# Patient Record
Sex: Female | Born: 1980 | Race: White | Hispanic: No | Marital: Married | Smoking: Never smoker
Health system: Southern US, Community
[De-identification: ages and names within clinical notes are randomized; demographics above are authoritative.]

## PROBLEM LIST (undated history)

## (undated) DIAGNOSIS — N814 Uterovaginal prolapse, unspecified: Secondary | ICD-10-CM

## (undated) DIAGNOSIS — R112 Nausea with vomiting, unspecified: Secondary | ICD-10-CM

## (undated) DIAGNOSIS — Z9889 Other specified postprocedural states: Secondary | ICD-10-CM

## (undated) DIAGNOSIS — T7840XA Allergy, unspecified, initial encounter: Secondary | ICD-10-CM

## (undated) DIAGNOSIS — E785 Hyperlipidemia, unspecified: Secondary | ICD-10-CM

## (undated) DIAGNOSIS — K900 Celiac disease: Secondary | ICD-10-CM

## (undated) DIAGNOSIS — C44311 Basal cell carcinoma of skin of nose: Secondary | ICD-10-CM

## (undated) HISTORY — DX: Basal cell carcinoma of skin of nose: C44.311

## (undated) HISTORY — DX: Celiac disease: K90.0

## (undated) HISTORY — DX: Allergy, unspecified, initial encounter: T78.40XA

## (undated) HISTORY — DX: Uterovaginal prolapse, unspecified: N81.4

## (undated) HISTORY — DX: Hyperlipidemia, unspecified: E78.5

## (undated) HISTORY — PX: WISDOM TOOTH EXTRACTION: SHX21

## (undated) HISTORY — PX: SKIN CANCER EXCISION: SHX779

## (undated) HISTORY — PX: TUBAL LIGATION: SHX77

---
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
## 2009-08-23 DIAGNOSIS — N814 Uterovaginal prolapse, unspecified: Secondary | ICD-10-CM

## 2009-08-23 HISTORY — DX: Uterovaginal prolapse, unspecified: N81.4

## 2011-08-06 ENCOUNTER — Ambulatory Visit: Payer: Self-pay | Admitting: Family Medicine

## 2011-11-19 ENCOUNTER — Ambulatory Visit (INDEPENDENT_AMBULATORY_CARE_PROVIDER_SITE_OTHER): Payer: 59 | Admitting: Family Medicine

## 2011-11-19 ENCOUNTER — Encounter: Payer: Self-pay | Admitting: Family Medicine

## 2011-11-19 VITALS — BP 112/75 | HR 83 | Temp 98.2°F | Ht 62.0 in | Wt 126.0 lb

## 2011-11-19 DIAGNOSIS — R5381 Other malaise: Secondary | ICD-10-CM

## 2011-11-19 DIAGNOSIS — R5383 Other fatigue: Secondary | ICD-10-CM

## 2011-11-19 DIAGNOSIS — R221 Localized swelling, mass and lump, neck: Secondary | ICD-10-CM

## 2011-11-19 DIAGNOSIS — E785 Hyperlipidemia, unspecified: Secondary | ICD-10-CM

## 2011-11-19 DIAGNOSIS — K900 Celiac disease: Secondary | ICD-10-CM | POA: Insufficient documentation

## 2011-11-19 DIAGNOSIS — R220 Localized swelling, mass and lump, head: Secondary | ICD-10-CM

## 2011-11-19 DIAGNOSIS — E049 Nontoxic goiter, unspecified: Secondary | ICD-10-CM

## 2011-11-19 LAB — CBC WITH DIFFERENTIAL/PLATELET
Basophils Relative: 0.4 % (ref 0.0–3.0)
Eosinophils Relative: 0.9 % (ref 0.0–5.0)
HCT: 40 % (ref 36.0–46.0)
Lymphs Abs: 2 10*3/uL (ref 0.7–4.0)
MCV: 95 fl (ref 78.0–100.0)
Monocytes Absolute: 0.4 10*3/uL (ref 0.1–1.0)
Monocytes Relative: 5.9 % (ref 3.0–12.0)
Neutrophils Relative %: 60.9 % (ref 43.0–77.0)
RBC: 4.21 Mil/uL (ref 3.87–5.11)
WBC: 6.3 10*3/uL (ref 4.5–10.5)

## 2011-11-19 LAB — COMPREHENSIVE METABOLIC PANEL
Albumin: 4.1 g/dL (ref 3.5–5.2)
Alkaline Phosphatase: 45 U/L (ref 39–117)
BUN: 14 mg/dL (ref 6–23)
CO2: 27 mEq/L (ref 19–32)
Calcium: 9.1 mg/dL (ref 8.4–10.5)
GFR: 86.02 mL/min (ref >60.00)
Glucose, Bld: 133 mg/dL — ABNORMAL HIGH (ref 70–99)
Potassium: 3.4 mEq/L — ABNORMAL LOW (ref 3.5–5.1)
Total Protein: 7.1 g/dL (ref 6.0–8.3)

## 2011-11-19 LAB — LIPID PANEL
Cholesterol: 155 mg/dL (ref 0–200)
LDL Cholesterol: 99 mg/dL (ref 0–99)
Triglycerides: 48 mg/dL (ref 0.0–149.0)

## 2011-11-19 LAB — T4, FREE: Free T4: 0.78 ng/dL (ref 0.60–1.60)

## 2011-11-19 NOTE — Progress Notes (Signed)
Subjective:    Patient ID: [name], female    DOB: 17-Nov-1980, 31 y.o.   MRN: [id number]  HPI Very pleasant 31 you G1P1 here to establish care.  1.  Right neck mass- noticed it at least a year ago but has been growing in size over past several months.  Non tender to palpation.  Has had some difficulty intermittently swallowing pills otherwise no dysphagia.  2.  Family h/o thyroid disease- mom has hypothyroidism, sister has hashimotos. She has been more fatigued and cold lately. Difficult to tell if she is constipated since she has Celiac disease. No other noticeable symptoms of hypo or hyperthyroidism.  3.  Celiac disease- asymptomatic when she does follow gluten free diet. Has a nonspecific, raised, scaly rash.  Non itchy- ? Related to her celiac.  She has an appt with derm for this.  Patient Active Problem List  Diagnosis  . Enlarged thyroid  . Mass of right side of neck  . Hyperlipidemia  . Celiac disease   Past Medical History  Diagnosis Date  . Hyperlipidemia   . Celiac disease   . Uterine prolapse    Past Surgical History  Procedure Date  . Appendectomy 1995  . Tonsillectomy and adenoidectomy 1995   History  Substance Use Topics  . Smoking status: Never Smoker   . Smokeless tobacco: Not on file  . Alcohol Use: Yes     occassionally   Family History  Problem Relation Age of Onset  . Hypothyroidism Mother   . Hyperlipidemia Father   . Celiac disease Sister   . Hashimoto's thyroiditis Sister   . Heart disease Paternal Grandfather 30    multiple CABG  . Hyperlipidemia Paternal Grandfather    No Known Allergies No current outpatient prescriptions on file prior to visit.   The PMH, PSH, Social History, Family History, Medications, and allergies have been reviewed in [facility], and have been updated if relevant.    Review of Systems See HPI No fevers No changes in weight No night sweats    Objective:   Physical Exam BP 112/75  Pulse 83  Temp 98.2 F  (36.8 C) (Oral)  Ht 5\' 2"  (1.575 m)  Wt 126 lb (57.153 kg)  BMI 23.05 kg/m2  LMP 10/21/2011  General:  Well-developed,well-nourished,in no acute distress; alert,appropriate and cooperative throughout examination Head:  normocephalic and atraumatic.   Eyes:  vision grossly intact, pupils equal, pupils round, and pupils reactive to light.   Ears:  R ear normal and L ear normal.   Nose:  no external deformity.   Mouth:  good dentition.   Neck:   ~1.5  cm freely movable non tender mass on right neck, enlarged thyroid gland, left > right, no nodules, NTTP No other cervical lymphadenopathy noted. Lungs:  Normal respiratory effort, chest expands symmetrically. Lungs are clear to auscultation, no crackles or wheezes. Heart:  Normal rate and regular rhythm. S1 and S2 normal without gallop, murmur, click, rub or other extra sounds. Extremities:  No clubbing, cyanosis, edema, or deformity noted with normal full range of motion of all joints.   Neurologic:  alert & oriented X3 and gait normal.   Psych:  Cognition and judgment appear intact. Alert and cooperative with normal attention span and concentration. No apparent delusions, illusions, hallucinations     Assessment & Plan:   1. Mass of right side of neck  Enlarging- ? Lymph node vs cyst. Will order ultrasound of neck for further evaluation. US Soft Tissue Head/Neck  2. Enlarged thyroid  Check TSH, FT4- ?hypothyroidism. Will evaluate thyroid with ultrasound- see above. US Soft Tissue Head/Neck, TSH, T4, Free  3. Fatigue  Likely related to #2 but will check other labs to rule out other possible contributing factors. The patient indicates understanding of these issues and agrees with the plan.  CBC with Differential, Comprehensive metabolic panel  4. HLD (hyperlipidemia)  Lipid Panel

## 2011-11-19 NOTE — Patient Instructions (Addendum)
It was great to meet you. Please stop by to see [name] after you to the lab- she will set up your ultrasound at [facility].  We will call you with your lab results.

## 2011-11-23 ENCOUNTER — Inpatient Hospital Stay (HOSPITAL_COMMUNITY): Admission: RE | Admit: 2011-11-23 | Payer: 59 | Source: Ambulatory Visit

## 2011-11-23 ENCOUNTER — Ambulatory Visit (HOSPITAL_COMMUNITY)
Admission: RE | Admit: 2011-11-23 | Discharge: 2011-11-23 | Disposition: A | Discharge status: Home / Self Care | Payer: 59 | Source: Ambulatory Visit | Attending: Family Medicine | Admitting: Family Medicine

## 2011-11-23 ENCOUNTER — Other Ambulatory Visit (HOSPITAL_COMMUNITY): Payer: 59

## 2011-11-23 DIAGNOSIS — R220 Localized swelling, mass and lump, head: Secondary | ICD-10-CM | POA: Insufficient documentation

## 2011-11-23 DIAGNOSIS — E049 Nontoxic goiter, unspecified: Secondary | ICD-10-CM

## 2011-11-23 DIAGNOSIS — R221 Localized swelling, mass and lump, neck: Secondary | ICD-10-CM

## 2011-11-24 ENCOUNTER — Other Ambulatory Visit: Payer: Self-pay | Admitting: Family Medicine

## 2011-11-24 DIAGNOSIS — R221 Localized swelling, mass and lump, neck: Secondary | ICD-10-CM

## 2011-12-07 ENCOUNTER — Encounter (INDEPENDENT_AMBULATORY_CARE_PROVIDER_SITE_OTHER): Payer: Self-pay | Admitting: Surgery

## 2011-12-07 ENCOUNTER — Ambulatory Visit (INDEPENDENT_AMBULATORY_CARE_PROVIDER_SITE_OTHER): Payer: Commercial Managed Care - PPO | Admitting: Surgery

## 2011-12-07 VITALS — BP 112/60 | HR 56 | Temp 99.6°F | Resp 18 | Ht 62.0 in | Wt 124.0 lb

## 2011-12-07 DIAGNOSIS — R221 Localized swelling, mass and lump, neck: Secondary | ICD-10-CM

## 2011-12-07 DIAGNOSIS — R220 Localized swelling, mass and lump, head: Secondary | ICD-10-CM

## 2011-12-07 NOTE — Patient Instructions (Signed)
Will repeat ultrasound of right anterior chain lymph nodes in 6 months.  Velora Heckler, MD, [facility] Office: [phone]

## 2011-12-07 NOTE — Progress Notes (Signed)
General Surgery [facility]  Chief Complaint  Patient presents with  . New Evaluation    lymph node enlargement - referral from Dr. 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 This has been present for many years. Over the past year the patient has noted mild enlargement. She denies any history of infection. She has not been on any antibiotics. Patient underwent a thyroid ultrasound in September 2013 which showed a normal thyroid gland. There was a 7 mm nodule at the site of the palpable abnormality. This was felt to represent a small lymph node and was felt to be normal. No other significant abnormality was identified.  Patient has had no prior head or neck surgery. She has never been on thyroid medication. She did undergo tonsillectomy at age 89.  There is a significant family history of endocrine disease with the mother being hypothyroid, a sister having Hashimoto's thyroiditis, and a maternal uncle with a pituitary tumor who underwent adrenalectomy for Cushing's syndrome.  Past Medical History  Diagnosis Date  . Hyperlipidemia   . Celiac disease   . Uterine prolapse      No current outpatient prescriptions on file.     No Known Allergies   Family History  Problem Relation Age of Onset  . Hypothyroidism Mother   . Hyperlipidemia Father   . Celiac disease Sister   . Hashimoto's thyroiditis Sister   . Heart disease Paternal Grandfather 30    multiple CABG  . Hyperlipidemia Paternal Grandfather      History   Social History  . Marital Status: Married    Spouse Name: N/A    Number of Children: N/A  . Years of Education: N/A   Social History Main Topics  . Smoking status: Never Smoker   . Smokeless tobacco: None  . Alcohol Use: Yes     occassionally  . Drug Use: None  . Sexually Active: None   Other Topics Concern  . 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     REVIEW OF SYSTEMS - PERTINENT POSITIVES ONLY: Denies tremor. Denies palpitations. Denies other lymphadenopathy. Denies recent infections. Denies significant dysphagia.  EXAM: Filed Vitals:   12/07/11 1026  BP: 112/60  Pulse: 56  Temp: 99.6 F (37.6 C)  Resp: 18    HEENT: normocephalic; pupils equal and reactive; sclerae clear; dentition good; mucous membranes moist NECK:  symmetric on extension; no palpable anterior or posterior cervical lymphadenopathy; no supraclavicular masses; no tenderness; palpable small nodular density superior right anterior cervical chain measuring less than 1 cm in diameter, nontender, mobile CHEST: clear to auscultation bilaterally without rales, rhonchi, or wheezes CARDIAC: regular rate and rhythm without significant murmur; peripheral pulses are full EXT:  non-tender without edema; no deformity NEURO: no gross focal deficits; no sign of tremor   LABORATORY RESULTS: See [facility] ([facility]Epic) for most recent results   RADIOLOGY RESULTS: See [facility] ([facility]Epic) for most recent results   IMPRESSION: #1 normal thyroid examination #2 palpable nodule high right anterior cervical chain, likely benign lymph node  PLAN: The patient is seen with her mother in attendance. I have reassured them both at this likely represents a small benign lymph node and there is no evidence of pathology. To be safe we will repeat a ultrasound in  6 months. Patient will return for physical examination and review of the ultrasound results.  Velora Heckler, MD, FACS General & Endocrine Surgery [facility]   Visit Diagnoses: 1. Mass of right side of neck     Primary Care Physician: Ruthe Mannan, MD

## 2011-12-09 ENCOUNTER — Ambulatory Visit (INDEPENDENT_AMBULATORY_CARE_PROVIDER_SITE_OTHER): Payer: Commercial Managed Care - PPO | Admitting: Surgery

## 2012-01-28 ENCOUNTER — Ambulatory Visit (INDEPENDENT_AMBULATORY_CARE_PROVIDER_SITE_OTHER): Payer: 59 | Admitting: Obstetrics & Gynecology

## 2012-01-28 ENCOUNTER — Encounter: Payer: Self-pay | Admitting: Obstetrics & Gynecology

## 2012-01-28 VITALS — BP 107/63 | HR 63 | Ht 62.0 in | Wt 124.4 lb

## 2012-01-28 DIAGNOSIS — IMO0001 Reserved for inherently not codable concepts without codable children: Secondary | ICD-10-CM

## 2012-01-28 DIAGNOSIS — N814 Uterovaginal prolapse, unspecified: Secondary | ICD-10-CM

## 2012-01-28 DIAGNOSIS — Z1151 Encounter for screening for human papillomavirus (HPV): Secondary | ICD-10-CM

## 2012-01-28 DIAGNOSIS — Z124 Encounter for screening for malignant neoplasm of cervix: Secondary | ICD-10-CM

## 2012-01-28 DIAGNOSIS — N812 Incomplete uterovaginal prolapse: Secondary | ICD-10-CM

## 2012-01-28 DIAGNOSIS — Z309 Encounter for contraceptive management, unspecified: Secondary | ICD-10-CM

## 2012-01-28 DIAGNOSIS — Z01419 Encounter for gynecological examination (general) (routine) without abnormal findings: Secondary | ICD-10-CM

## 2012-01-28 MED ORDER — NORETHIN ACE-ETH ESTRAD-FE 1-20 MG-MCG(24) PO TABS: 1.0000 | ORAL_TABLET | Freq: Every day | ORAL | Status: DC

## 2012-01-28 NOTE — Progress Notes (Signed)
  Subjective:     [name] is a 31 y.o. G1P1 female and is here for a comprehensive gynecologic physical exam and to establish gynecologic care. She was referred by Dr. Ruthe Mannan. The patient reports  having significant uterine prolapse after the birth of her only child. Her former gynecologist apparently told her she will need a hysterectomy. She reports feeling pelvic pelvic pressure and lower pelvic discomfort frequently. She is unsure if she is done with childbearing. No issues during intercourse, she uses condoms for contraception, but wants to be on OCPs.  History   Social History  . Marital Status: Married    Spouse Name: N/A    Number of Children: N/A  . Years of Education: N/A   Occupational History  . Not on file.   Social History Main Topics  . Smoking status: Never Smoker   . Smokeless tobacco: Never Used  . Alcohol Use: Yes     Comment: occassionally  . Drug Use: No  . Sexually Active: Yes -- Female partner(s)    Birth Control/ Protection: Condom   Other Topics Concern  . 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   Health Maintenance  Topic Date Due  . Pap Smear  02/28/1998  . Tetanus/tdap  03/01/1999  . Influenza Vaccine  10/25/2011    The following portions of the patient's history were reviewed and updated as appropriate: allergies, current medications, past family history, past medical history, past social history, past surgical history and problem list.  Review of Systems Pertinent items are noted in HPI.   Objective:   Blood pressure 107/63, pulse 63, height 5\' 2"  (1.575 m), weight 124 lb 6.4 oz (56.427 kg), last menstrual period 01/16/2012. GENERAL: Well-developed, well-nourished female in no acute distress.  HEENT: Normocephalic, atraumatic. Sclerae anicteric.  NECK: Supple. Normal thyroid.  LUNGS: Clear to auscultation bilaterally.   HEART: Regular rate and rhythm. BREASTS: Symmetric in size. No masses, skin changes, nipple drainage, or lymphadenopathy. ABDOMEN: Soft, nontender, nondistended. No organomegaly. PELVIC: Normal external female genitalia. Vagina is pink and rugated.  Normal discharge. Normal cervix contour. Pap smear obtained. Uterus is normal in size, noted to prolapse halfway down vagina with Valsalva (Grade 1 prolapse).  No vaginal prolapse, no rectocele or cystocele noted. No adnexal mass or tenderness.  EXTREMITIES: No cyanosis, clubbing, or edema, 2+ distal pulses.   Assessment:    Healthy female exam.  Grade 1 uterine prolapse Desires OCPs for contraception     Plan:  Pap done, will follow up results and manage accordingly. 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  The nonsurgical options involve pessary placement or other conservative management, patient is not interested in these options.  She understands and will consider her options for a while longer.  She was told to call/come back once she reaches a decision. Routine preventative health maintenance measures emphasized

## 2012-01-28 NOTE — Patient Instructions (Signed)
Preventive Care for Adults, Female A healthy lifestyle and preventive care can promote health and wellness. Preventive health guidelines for women include the following key practices.  A routine yearly physical is a good way to check with your caregiver about your health and preventive screening. It is a chance to share any concerns and updates on your health, and to receive a thorough exam.  Visit your dentist for a routine exam and preventive care every 6 months. Brush your teeth twice a day and floss once a day. Good oral hygiene prevents tooth decay and gum disease.  The frequency of eye exams is based on your age, health, family medical history, use of contact lenses, and other factors. Follow your caregiver's recommendations for frequency of eye exams.  Eat a healthy diet. Foods like vegetables, fruits, whole grains, low-fat dairy products, and lean protein foods contain the nutrients you need without too many calories. Decrease your intake of foods high in solid fats, added sugars, and salt. Eat the right amount of calories for you.Get information about a proper diet from your caregiver, if necessary.  Regular physical exercise is one of the most important things you can do for your health. Most adults should get at least 150 minutes of moderate-intensity exercise (any activity that increases your heart rate and causes you to sweat) each week. In addition, most adults need muscle-strengthening exercises on 2 or more days a week.  Maintain a healthy weight. The body mass index (BMI) is a screening tool to identify possible weight problems. It provides an estimate of body fat based on height and weight. Your caregiver can help determine your BMI, and can help you achieve or maintain a healthy weight.For adults 20 years and older:  A BMI below 18.5 is considered underweight.  A BMI of 18.5 to 24.9 is normal.  A BMI of 25 to 29.9 is considered overweight.  A BMI of 30 and above is  considered obese.  Maintain normal blood lipids and cholesterol levels by exercising and minimizing your intake of saturated fat. Eat a balanced diet with plenty of fruit and vegetables. Blood tests for lipids and cholesterol should begin at age 41 and be repeated every 5 years. If your lipid or cholesterol levels are high, you are over 50, or you are at high risk for heart disease, you may need your cholesterol levels checked more frequently.Ongoing high lipid and cholesterol levels should be treated with medicines if diet and exercise are not effective.  If you smoke, find out from your caregiver how to quit. If you do not use tobacco, do not start.  If you are pregnant, do not drink alcohol. If you are breastfeeding, be very cautious about drinking alcohol. If you are not pregnant and choose to drink alcohol, do not exceed 1 drink per day. One drink is considered to be 12 ounces (355 mL) of beer, 5 ounces (148 mL) of wine, or 1.5 ounces (44 mL) of liquor.  Avoid use of street drugs. Do not share needles with anyone. Ask for help if you need support or instructions about stopping the use of drugs.  High blood pressure causes heart disease and increases the risk of stroke. Your blood pressure should be checked at least every 1 to 2 years. Ongoing high blood pressure should be treated with medicines if weight loss and exercise are not effective.  If you are 65 to 31 years old, ask your caregiver if you should take aspirin to prevent strokes.  Diabetes  screening involves taking a blood sample to check your fasting blood sugar level. This should be done once every 3 years, after age 45, if you are within normal weight and without risk factors for diabetes. Testing should be considered at a younger age or be carried out more frequently if you are overweight and have at least 1 risk factor for diabetes.  Breast cancer screening is essential preventive care for women. You should practice "breast  self-awareness." This means understanding the normal appearance and feel of your breasts and may include breast self-examination. Any changes detected, no matter how small, should be reported to a caregiver. Women in their 20s and 30s should have a clinical breast exam (CBE) by a caregiver as part of a regular health exam every 1 to 3 years. After age 40, women should have a CBE every year. Starting at age 40, women should consider having a mammography (breast X-ray test) every year. Women who have a family history of breast cancer should talk to their caregiver about genetic screening. Women at a high risk of breast cancer should talk to their caregivers about having magnetic resonance imaging (MRI) and a mammography every year.  The Pap test is a screening test for cervical cancer. A Pap test can show cell changes on the cervix that might become cervical cancer if left untreated. A Pap test is a procedure in which cells are obtained and examined from the lower end of the uterus (cervix).  Women should have a Pap test starting at age 21.  Between ages 21 and 29, Pap tests should be repeated every 2 years.  Beginning at age 30, you should have a Pap test every 3 years as long as the past 3 Pap tests have been normal.  Some women have medical problems that increase the chance of getting cervical cancer. Talk to your caregiver about these problems. It is especially important to talk to your caregiver if a new problem develops soon after your last Pap test. In these cases, your caregiver may recommend more frequent screening and Pap tests.  The above recommendations are the same for women who have or have not gotten the vaccine for human papillomavirus (HPV).  If you had a hysterectomy for a problem that was not cancer or a condition that could lead to cancer, then you no longer need Pap tests. Even if you no longer need a Pap test, a regular exam is a good idea to make sure no other problems are  starting.  If you are between ages 65 and 70, and you have had normal Pap tests going back 10 years, you no longer need Pap tests. Even if you no longer need a Pap test, a regular exam is a good idea to make sure no other problems are starting.  If you have had past treatment for cervical cancer or a condition that could lead to cancer, you need Pap tests and screening for cancer for at least 20 years after your treatment.  If Pap tests have been discontinued, risk factors (such as a new sexual partner) need to be reassessed to determine if screening should be resumed.  The HPV test is an additional test that may be used for cervical cancer screening. The HPV test looks for the virus that can cause the cell changes on the cervix. The cells collected during the Pap test can be tested for HPV. The HPV test could be used to screen women aged 30 years and older, and should   be used in women of any age who have unclear Pap test results. After the age of 30, women should have HPV testing at the same frequency as a Pap test.  Colorectal cancer can be detected and often prevented. Most routine colorectal cancer screening begins at the age of 50 and continues through age 75. However, your caregiver may recommend screening at an earlier age if you have risk factors for colon cancer. On a yearly basis, your caregiver may provide home test kits to check for hidden blood in the stool. Use of a small camera at the end of a tube, to directly examine the colon (sigmoidoscopy or colonoscopy), can detect the earliest forms of colorectal cancer. Talk to your caregiver about this at age 50, when routine screening begins. Direct examination of the colon should be repeated every 5 to 10 years through age 75, unless early forms of pre-cancerous polyps or small growths are found.  Hepatitis C blood testing is recommended for all people born from 1945 through 1965 and any individual with known risks for hepatitis C.  Practice  safe sex. Use condoms and avoid high-risk sexual practices to reduce the spread of sexually transmitted infections (STIs). STIs include gonorrhea, chlamydia, syphilis, trichomonas, herpes, HPV, and human immunodeficiency virus (HIV). Herpes, HIV, and HPV are viral illnesses that have no cure. They can result in disability, cancer, and death. Sexually active women aged 25 and younger should be checked for chlamydia. Older women with new or multiple partners should also be tested for chlamydia. Testing for other STIs is recommended if you are sexually active and at increased risk.  Osteoporosis is a disease in which the bones lose minerals and strength with aging. This can result in serious bone fractures. The risk of osteoporosis can be identified using a bone density scan. Women ages 65 and over and women at risk for fractures or osteoporosis should discuss screening with their caregivers. Ask your caregiver whether you should take a calcium supplement or vitamin D to reduce the rate of osteoporosis.  Menopause can be associated with physical symptoms and risks. Hormone replacement therapy is available to decrease symptoms and risks. You should talk to your caregiver about whether hormone replacement therapy is right for you.  Use sunscreen with sun protection factor (SPF) of 30 or more. Apply sunscreen liberally and repeatedly throughout the day. You should seek shade when your shadow is shorter than you. Protect yourself by wearing long sleeves, pants, a wide-brimmed hat, and sunglasses year round, whenever you are outdoors.  Once a month, do a whole body skin exam, using a mirror to look at the skin on your back. Notify your caregiver of new moles, moles that have irregular borders, moles that are larger than a pencil eraser, or moles that have changed in shape or color.  Stay current with required immunizations.  Influenza. You need a dose every fall (or winter). The composition of the flu vaccine  changes each year, so being vaccinated once is not enough.  Pneumococcal polysaccharide. You need 1 to 2 doses if you smoke cigarettes or if you have certain chronic medical conditions. You need 1 dose at age 65 (or older) if you have never been vaccinated.  Tetanus, diphtheria, pertussis (Tdap, Td). Get 1 dose of Tdap vaccine if you are younger than age 65, are over 65 and have contact with an infant, are a healthcare worker, are pregnant, or simply want to be protected from whooping cough. After that, you need a Td   booster dose every 10 years. Consult your caregiver if you have not had at least 3 tetanus and diphtheria-containing shots sometime in your life or have a deep or dirty wound.  HPV. You need this vaccine if you are a woman age 26 or younger. The vaccine is given in 3 doses over 6 months.  Measles, mumps, rubella (MMR). You need at least 1 dose of MMR if you were born in 1957 or later. You may also need a second dose.  Meningococcal. If you are age 19 to 21 and a first-year college student living in a residence hall, or have one of several medical conditions, you need to get vaccinated against meningococcal disease. You may also need additional booster doses.  Zoster (shingles). If you are age 60 or older, you should get this vaccine.  Varicella (chickenpox). If you have never had chickenpox or you were vaccinated but received only 1 dose, talk to your caregiver to find out if you need this vaccine.  Hepatitis A. You need this vaccine if you have a specific risk factor for hepatitis A virus infection or you simply wish to be protected from this disease. The vaccine is usually given as 2 doses, 6 to 18 months apart.  Hepatitis B. You need this vaccine if you have a specific risk factor for hepatitis B virus infection or you simply wish to be protected from this disease. The vaccine is given in 3 doses, usually over 6 months. Preventive Services / Frequency Ages 19 to 39  Blood  pressure check.** / Every 1 to 2 years.  Lipid and cholesterol check.** / Every 5 years beginning at age 20.  Clinical breast exam.** / Every 3 years for women in their 20s and 30s.  Pap test.** / Every 2 years from ages 21 through 29. Every 3 years starting at age 30 through age 65 or 70 with a history of 3 consecutive normal Pap tests.  HPV screening.** / Every 3 years from ages 30 through ages 65 to 70 with a history of 3 consecutive normal Pap tests.  Hepatitis C blood test.** / For any individual with known risks for hepatitis C.  Skin self-exam. / Monthly.  Influenza immunization.** / Every year.  Pneumococcal polysaccharide immunization.** / 1 to 2 doses if you smoke cigarettes or if you have certain chronic medical conditions.  Tetanus, diphtheria, pertussis (Tdap, Td) immunization. / A one-time dose of Tdap vaccine. After that, you need a Td booster dose every 10 years.  HPV immunization. / 3 doses over 6 months, if you are 26 and younger.  Measles, mumps, rubella (MMR) immunization. / You need at least 1 dose of MMR if you were born in 1957 or later. You may also need a second dose.  Meningococcal immunization. / 1 dose if you are age 19 to 21 and a first-year college student living in a residence hall, or have one of several medical conditions, you need to get vaccinated against meningococcal disease. You may also need additional booster doses.  Varicella immunization.** / Consult your caregiver.  Hepatitis A immunization.** / Consult your caregiver. 2 doses, 6 to 18 months apart.  Hepatitis B immunization.** / Consult your caregiver. 3 doses usually over 6 months. Ages 40 to 64  Blood pressure check.** / Every 1 to 2 years.  Lipid and cholesterol check.** / Every 5 years beginning at age 20.  Clinical breast exam.** / Every year after age 40.  Mammogram.** / Every year beginning at age 40   and continuing for as long as you are in good health. Consult with your  caregiver.  Pap test.** / Every 3 years starting at age 30 through age 65 or 70 with a history of 3 consecutive normal Pap tests.  HPV screening.** / Every 3 years from ages 30 through ages 65 to 70 with a history of 3 consecutive normal Pap tests.  Fecal occult blood test (FOBT) of stool. / Every year beginning at age 50 and continuing until age 75. You may not need to do this test if you get a colonoscopy every 10 years.  Flexible sigmoidoscopy or colonoscopy.** / Every 5 years for a flexible sigmoidoscopy or every 10 years for a colonoscopy beginning at age 50 and continuing until age 75.  Hepatitis C blood test.** / For all people born from 1945 through 1965 and any individual with known risks for hepatitis C.  Skin self-exam. / Monthly.  Influenza immunization.** / Every year.  Pneumococcal polysaccharide immunization.** / 1 to 2 doses if you smoke cigarettes or if you have certain chronic medical conditions.  Tetanus, diphtheria, pertussis (Tdap, Td) immunization.** / A one-time dose of Tdap vaccine. After that, you need a Td booster dose every 10 years.  Measles, mumps, rubella (MMR) immunization. / You need at least 1 dose of MMR if you were born in 1957 or later. You may also need a second dose.  Varicella immunization.** / Consult your caregiver.  Meningococcal immunization.** / Consult your caregiver.  Hepatitis A immunization.** / Consult your caregiver. 2 doses, 6 to 18 months apart.  Hepatitis B immunization.** / Consult your caregiver. 3 doses, usually over 6 months. Ages 65 and over  Blood pressure check.** / Every 1 to 2 years.  Lipid and cholesterol check.** / Every 5 years beginning at age 20.  Clinical breast exam.** / Every year after age 40.  Mammogram.** / Every year beginning at age 40 and continuing for as long as you are in good health. Consult with your caregiver.  Pap test.** / Every 3 years starting at age 30 through age 65 or 70 with a 3  consecutive normal Pap tests. Testing can be stopped between 65 and 70 with 3 consecutive normal Pap tests and no abnormal Pap or HPV tests in the past 10 years.  HPV screening.** / Every 3 years from ages 30 through ages 65 or 70 with a history of 3 consecutive normal Pap tests. Testing can be stopped between 65 and 70 with 3 consecutive normal Pap tests and no abnormal Pap or HPV tests in the past 10 years.  Fecal occult blood test (FOBT) of stool. / Every year beginning at age 50 and continuing until age 75. You may not need to do this test if you get a colonoscopy every 10 years.  Flexible sigmoidoscopy or colonoscopy.** / Every 5 years for a flexible sigmoidoscopy or every 10 years for a colonoscopy beginning at age 50 and continuing until age 75.  Hepatitis C blood test.** / For all people born from 1945 through 1965 and any individual with known risks for hepatitis C.  Osteoporosis screening.** / A one-time screening for women ages 65 and over and women at risk for fractures or osteoporosis.  Skin self-exam. / Monthly.  Influenza immunization.** / Every year.  Pneumococcal polysaccharide immunization.** / 1 dose at age 65 (or older) if you have never been vaccinated.  Tetanus, diphtheria, pertussis (Tdap, Td) immunization. / A one-time dose of Tdap vaccine if you are over   65 and have contact with an infant, are a Research scientist (physical sciences), or simply want to be protected from whooping cough. After that, you need a Td booster dose every 10 years.  Varicella immunization.** / Consult your caregiver.  Meningococcal immunization.** / Consult your caregiver.  Hepatitis A immunization.** / Consult your caregiver. 2 doses, 6 to 18 months apart.  Hepatitis B immunization.** / Check with your caregiver. 3 doses, usually over 6 months. ** Family history and personal history of risk and conditions may change your caregiver's recommendations. Document Released: 04/07/2001 Document Revised: 05/04/2011  Document Reviewed: 07/07/2010 [facility] Patient Information 2013 [place], [place].  Thank you for enrolling in MyChart. Please follow the instructions below to securely access your online medical record. MyChart allows you to send messages to your doctor, view your test results, manage appointments, and more.   How Do I Sign Up? 1. In your Internet browser, go to [employer] and enter [email or web address]. 2. Click on the Sign Up Now link in the Sign In box. You will see the New Member Sign Up page. 3. Enter your MyChart Access Code exactly as it appears below. You will not need to use this code after you've completed the sign-up process. If you do not sign up before the expiration date, you must request a new code. MyChart Access Code: [email or web address] Expires: 02/27/2012  8:56 AM  4. Enter your Social Security Number ([email or web address]) and Date of Birth (mm/dd/yyyy) as indicated and click Submit. You will be taken to the next sign-up page. 5. Create a MyChart ID. This will be your MyChart login ID and cannot be changed, so think of one that is secure and easy to remember. 6. Create a MyChart password. You can change your password at any time. 7. Enter your Password Reset Question and Answer. This can be used at a later time if you forget your password.  8. Enter your e-mail address. You will receive e-mail notification when new information is available in MyChart. 9. Click Sign Up. You can now view your medical record.   Additional Information Remember, MyChart is NOT to be used for urgent needs. For medical emergencies, dial 911.

## 2012-02-05 ENCOUNTER — Telehealth: Payer: Self-pay | Admitting: *Deleted

## 2012-02-05 DIAGNOSIS — IMO0001 Reserved for inherently not codable concepts without codable children: Secondary | ICD-10-CM

## 2012-02-05 MED ORDER — NORGESTIMATE-ETH ESTRADIOL 0.25-35 MG-MCG PO TABS: 1.0000 | ORAL_TABLET | Freq: Every day | ORAL | Status: DC

## 2012-02-05 NOTE — Telephone Encounter (Signed)
Patients ocp has been dc's and would like something else called in.

## 2012-02-24 DIAGNOSIS — C44311 Basal cell carcinoma of skin of nose: Secondary | ICD-10-CM

## 2012-02-24 HISTORY — DX: Basal cell carcinoma of skin of nose: C44.311

## 2012-05-27 ENCOUNTER — Other Ambulatory Visit (INDEPENDENT_AMBULATORY_CARE_PROVIDER_SITE_OTHER): Payer: Self-pay

## 2012-05-27 ENCOUNTER — Telehealth (INDEPENDENT_AMBULATORY_CARE_PROVIDER_SITE_OTHER): Payer: Self-pay | Admitting: General Surgery

## 2012-05-27 DIAGNOSIS — E041 Nontoxic single thyroid nodule: Secondary | ICD-10-CM

## 2012-05-27 NOTE — Telephone Encounter (Signed)
Patient is aware of appt  For u/s  [place] . Mailed appt card to patient for LTF

## 2012-06-01 ENCOUNTER — Other Ambulatory Visit: Payer: Self-pay | Admitting: Occupational Medicine

## 2012-06-01 ENCOUNTER — Ambulatory Visit: Payer: Self-pay

## 2012-06-01 DIAGNOSIS — R7612 Nonspecific reaction to cell mediated immunity measurement of gamma interferon antigen response without active tuberculosis: Secondary | ICD-10-CM

## 2012-06-06 ENCOUNTER — Other Ambulatory Visit: Payer: 59

## 2012-06-08 ENCOUNTER — Ambulatory Visit (HOSPITAL_COMMUNITY)
Admission: RE | Admit: 2012-06-08 | Discharge: 2012-06-08 | Disposition: A | Discharge status: Home / Self Care | Payer: 59 | Source: Ambulatory Visit | Attending: Surgery | Admitting: Surgery

## 2012-06-08 DIAGNOSIS — E041 Nontoxic single thyroid nodule: Secondary | ICD-10-CM

## 2012-06-08 DIAGNOSIS — E049 Nontoxic goiter, unspecified: Secondary | ICD-10-CM | POA: Insufficient documentation

## 2012-07-11 ENCOUNTER — Encounter (INDEPENDENT_AMBULATORY_CARE_PROVIDER_SITE_OTHER): Payer: Self-pay | Admitting: Surgery

## 2012-07-11 ENCOUNTER — Ambulatory Visit (INDEPENDENT_AMBULATORY_CARE_PROVIDER_SITE_OTHER): Payer: Commercial Managed Care - PPO | Admitting: Surgery

## 2012-07-11 VITALS — BP 110/70 | HR 67 | Temp 97.4°F | Resp 14 | Ht 62.0 in | Wt 127.4 lb

## 2012-07-11 DIAGNOSIS — R220 Localized swelling, mass and lump, head: Secondary | ICD-10-CM

## 2012-07-11 DIAGNOSIS — E049 Nontoxic goiter, unspecified: Secondary | ICD-10-CM

## 2012-07-11 DIAGNOSIS — R221 Localized swelling, mass and lump, neck: Secondary | ICD-10-CM

## 2012-07-11 NOTE — Progress Notes (Signed)
General Surgery [facility]  Visit Diagnoses: 1. Mass of right side of neck   2. Enlarged thyroid     HISTORY: Patient is a 32 year old white female seen in the fall of 2013 for an isolated lymph node in the right neck. Patient was seen and examined at that time. These findings were felt to be benign. At my request she returns today for examination. A repeat ultrasound of the neck was also obtained in April 2014. This shows a normal thyroid gland without lesion. There are normal bilateral anterior cervical lymph nodes. No abnormalities were identified.  PERTINENT REVIEW OF SYSTEMS: Patient denies any new masses or tenderness. She denies any B-symptoms such as weight loss or night sweats.  EXAM: HEENT: normocephalic; pupils equal and reactive; sclerae clear; dentition good; mucous membranes moist NECK:  symmetric on extension; no palpable anterior or posterior cervical lymphadenopathy; no supraclavicular masses; no tenderness CHEST: clear to auscultation bilaterally without rales, rhonchi, or wheezes CARDIAC: regular rate and rhythm without significant murmur; peripheral pulses are full EXT:  non-tender without edema; no deformity NEURO: no gross focal deficits; no sign of tremor   IMPRESSION: Normal examination  PLAN: Patient is given a copy of the ultrasound results for her records. Her physical examination is completely normal. No further diagnostic studies are indicated.  Patient will return as needed.  Velora Heckler, MD, [facility] Office: [phone]

## 2012-07-11 NOTE — Patient Instructions (Signed)
Lymphadenopathy  Lymphadenopathy means "disease of the lymph glands." But the term is usually used to describe swollen or enlarged lymph glands, also called lymph nodes. These are the bean-shaped organs found in many locations including the neck, underarm, and groin. Lymph glands are part of the immune system, which fights infections in your body. Lymphadenopathy can occur in just one area of the body, such as the neck, or it can be generalized, with lymph node enlargement in several areas. The nodes found in the neck are the most common sites of lymphadenopathy.  CAUSES    When your immune system responds to germs (such as viruses or bacteria ), infection-fighting cells and fluid build up. This causes the glands to grow in size. This is usually not something to worry about. Sometimes, the glands themselves can become infected and inflamed. This is called lymphadenitis.  Enlarged lymph nodes can be caused by many diseases:   Bacterial disease, such as strep throat or a skin infection.   Viral disease, such as a common cold.   Other germs, such as lyme disease, tuberculosis, or sexually transmitted diseases.   Cancers, such as lymphoma (cancer of the lymphatic system) or leukemia (cancer of the white blood cells).   Inflammatory diseases such as lupus or rheumatoid arthritis.   Reactions to medications.  Many of the diseases above are rare, but important. This is why you should see your caregiver if you have lymphadenopathy.  SYMPTOMS     Swollen, enlarged lumps in the neck, back of the head or other locations.   Tenderness.   Warmth or redness of the skin over the lymph nodes.   Fever.  DIAGNOSIS   Enlarged lymph nodes are often near the source of infection. They can help healthcare providers diagnose your illness. For instance:     Swollen lymph nodes around the jaw might be caused by an infection in the mouth.   Enlarged glands in the neck often signal a throat infection.    Lymph nodes that are swollen in more than one area often indicate an illness caused by a virus.  Your caregiver most likely will know what is causing your lymphadenopathy after listening to your history and examining you. Blood tests, x-rays or other tests may be needed. If the cause of the enlarged lymph node cannot be found, and it does not go away by itself, then a biopsy may be needed. Your caregiver will discuss this with you.  TREATMENT    Treatment for your enlarged lymph nodes will depend on the cause. Many times the nodes will shrink to normal size by themselves, with no treatment. Antibiotics or other medicines may be needed for infection. Only take over-the-counter or prescription medicines for pain, discomfort or fever as directed by your caregiver.  HOME CARE INSTRUCTIONS    Swollen lymph glands usually return to normal when the underlying medical condition goes away. If they persist, contact your health-care provider. He/she might prescribe antibiotics or other treatments, depending on the diagnosis. Take any medications exactly as prescribed. Keep any follow-up appointments made to check on the condition of your enlarged nodes.    SEEK MEDICAL CARE IF:     Swelling lasts for more than two weeks.   You have symptoms such as weight loss, night sweats, fatigue or fever that does not go away.   The lymph nodes are hard, seem fixed to the skin or are growing rapidly.   Skin over the lymph nodes is red and inflamed. This   could mean there is an infection.  SEEK IMMEDIATE MEDICAL CARE IF:     Fluid starts leaking from the area of the enlarged lymph node.   You develop a fever of 102 F (38.9 C) or greater.   Severe pain develops (not necessarily at the site of a large lymph node).   You develop chest pain or shortness of breath.   You develop worsening abdominal pain.  MAKE SURE YOU:     Understand these instructions.   Will watch your condition.    Will get help right away if you are not doing well or get worse.  Document Released: 11/19/2007 Document Revised: 05/04/2011 Document Reviewed: 11/19/2007  [employer] Patient Information 2013 [employer]

## 2013-01-12 ENCOUNTER — Telehealth: Payer: Self-pay | Admitting: *Deleted

## 2013-01-12 DIAGNOSIS — IMO0001 Reserved for inherently not codable concepts without codable children: Secondary | ICD-10-CM

## 2013-01-12 MED ORDER — NORGESTIMATE-ETH ESTRADIOL 0.25-35 MG-MCG PO TABS: 1.0000 | ORAL_TABLET | Freq: Every day | ORAL | Status: DC

## 2013-01-12 NOTE — Telephone Encounter (Signed)
Patient has appointment next month for her yearly exam and she needs rx for ocp called to her pharmacy as she has ran out of refills.

## 2013-01-18 ENCOUNTER — Telehealth: Payer: Self-pay | Admitting: *Deleted

## 2013-01-18 NOTE — Telephone Encounter (Signed)
Fax received for med refill.  Patient has already called for same refill for ocp.  This has been called in for her.

## 2013-01-31 ENCOUNTER — Ambulatory Visit: Payer: Self-pay | Admitting: Obstetrics & Gynecology

## 2013-02-07 ENCOUNTER — Ambulatory Visit (INDEPENDENT_AMBULATORY_CARE_PROVIDER_SITE_OTHER): Payer: 59 | Admitting: Obstetrics & Gynecology

## 2013-02-07 ENCOUNTER — Encounter: Payer: Self-pay | Admitting: Obstetrics & Gynecology

## 2013-02-07 VITALS — BP 114/74 | HR 59 | Ht 62.0 in | Wt 131.0 lb

## 2013-02-07 DIAGNOSIS — Z124 Encounter for screening for malignant neoplasm of cervix: Secondary | ICD-10-CM

## 2013-02-07 DIAGNOSIS — Z1151 Encounter for screening for human papillomavirus (HPV): Secondary | ICD-10-CM

## 2013-02-07 DIAGNOSIS — Z01419 Encounter for gynecological examination (general) (routine) without abnormal findings: Secondary | ICD-10-CM

## 2013-02-07 DIAGNOSIS — Z3169 Encounter for other general counseling and advice on procreation: Secondary | ICD-10-CM

## 2013-02-07 MED ORDER — PRENATAL VITAMINS 0.8 MG PO TABS: 1.0000 | ORAL_TABLET | Freq: Every day | ORAL | Status: DC

## 2013-02-07 NOTE — Progress Notes (Signed)
    GYNECOLOGY CLINIC ANNUAL PREVENTATIVE CARE ENCOUNTER NOTE  Subjective:     [name] is a 32 y.o. G5P1001 female here for a routine annual gynecologic exam.  Current complaints: None. She is trying to conceive, no GYN concerns.   Gynecologic History Patient's last menstrual period was 01/17/2013. Last Pap: Normal pap and negative HPV on 01/28/12.   Obstetric History OB History  Gravida Para Term Preterm AB SAB TAB Ectopic Multiple Living  1 1 1       1     # Outcome Date GA Lbr Len/2nd Weight Sex Delivery Anes PTL Lv  1 TRM      SVD         The following portions of the patient's history were reviewed and updated as appropriate: allergies, current medications, past family history, past medical history, past social history, past surgical history and problem list.  Review of Systems A comprehensive review of systems was negative.    Objective:  BP 114/74  Pulse 59  Ht 5\' 2"  (1.575 m)  Wt 131 lb (59.421 kg)  BMI 23.95 kg/m2  LMP 01/17/2013 GENERAL: Well-developed, well-nourished female in no acute distress.  HEENT: Normocephalic, atraumatic. Sclerae anicteric.  NECK: Supple. Normal thyroid.  LUNGS: Clear to auscultation bilaterally.  HEART: Regular rate and rhythm. BREASTS: Symmetric in size. No masses, skin changes, nipple drainage, or lymphadenopathy. ABDOMEN: Soft, nontender, nondistended. No organomegaly. PELVIC: Normal external female genitalia. Vagina is pink and rugated.  Normal discharge. Normal cervix contour. Pap smear obtained. Uterus is normal in size. No adnexal mass or tenderness.  EXTREMITIES: No cyanosis, clubbing, or edema, 2+ distal pulses.   Assessment:    Healthy female exam.    Plan:   Pap done, will follow up results and manage accordingly. Preconception counseling given, encouraged to start prenatal vitamins Routine preventative health maintenance measures emphasized   Jaynie Collins, MD, FACOG Attending Obstetrician &  Gynecologist Faculty Practice, [facility]

## 2013-02-07 NOTE — Patient Instructions (Signed)
Preventive Care for Adults, Female A healthy lifestyle and preventive care can promote health and wellness. Preventive health guidelines for women include the following key practices.  A routine yearly physical is a good way to check with your caregiver about your health and preventive screening. It is a chance to share any concerns and updates on your health, and to receive a thorough exam.  Visit your dentist for a routine exam and preventive care every 6 months. Brush your teeth twice a day and floss once a day. Good oral hygiene prevents tooth decay and gum disease.  The frequency of eye exams is based on your age, health, family medical history, use of contact lenses, and other factors. Follow your caregiver's recommendations for frequency of eye exams.  Eat a healthy diet. Foods like vegetables, fruits, whole grains, low-fat dairy products, and lean protein foods contain the nutrients you need without too many calories. Decrease your intake of foods high in solid fats, added sugars, and salt. Eat the right amount of calories for you.Get information about a proper diet from your caregiver, if necessary.  Regular physical exercise is one of the most important things you can do for your health. Most adults should get at least 150 minutes of moderate-intensity exercise (any activity that increases your heart rate and causes you to sweat) each week. In addition, most adults need muscle-strengthening exercises on 2 or more days a week.  Maintain a healthy weight. The body mass index (BMI) is a screening tool to identify possible weight problems. It provides an estimate of body fat based on height and weight. Your caregiver can help determine your BMI, and can help you achieve or maintain a healthy weight.For adults 20 years and older:  A BMI below 18.5 is considered underweight.  A BMI of 18.5 to 24.9 is normal.  A BMI of 25 to 29.9 is considered overweight.  A BMI of 30 and above is  considered obese.  Maintain normal blood lipids and cholesterol levels by exercising and minimizing your intake of saturated fat. Eat a balanced diet with plenty of fruit and vegetables. Blood tests for lipids and cholesterol should begin at age 41 and be repeated every 5 years. If your lipid or cholesterol levels are high, you are over 50, or you are at high risk for heart disease, you may need your cholesterol levels checked more frequently.Ongoing high lipid and cholesterol levels should be treated with medicines if diet and exercise are not effective.  If you smoke, find out from your caregiver how to quit. If you do not use tobacco, do not start.  Lung cancer screening is recommended for adults aged 28 80 years who are at high risk for developing lung cancer because of a history of smoking. Yearly low-dose computed tomography (CT) is recommended for people who have at least a 30-pack-year history of smoking and are a current smoker or have quit within the past 15 years. A pack year of smoking is smoking an average of 1 pack of cigarettes a day for 1 year (for example: 1 pack a day for 30 years or 2 packs a day for 15 years). Yearly screening should continue until the smoker has stopped smoking for at least 15 years. Yearly screening should also be stopped for people who develop a health problem that would prevent them from having lung cancer treatment.  If you are pregnant, do not drink alcohol. If you are breastfeeding, be very cautious about drinking alcohol. If you are  not pregnant and choose to drink alcohol, do not exceed 1 drink per day. One drink is considered to be 12 ounces (355 mL) of beer, 5 ounces (148 mL) of wine, or 1.5 ounces (44 mL) of liquor.  Avoid use of street drugs. Do not share needles with anyone. Ask for help if you need support or instructions about stopping the use of drugs.  High blood pressure causes heart disease and increases the risk of stroke. Your blood pressure  should be checked at least every 1 to 2 years. Ongoing high blood pressure should be treated with medicines if weight loss and exercise are not effective.  If you are 55 to 32 years old, ask your caregiver if you should take aspirin to prevent strokes.  Diabetes screening involves taking a blood sample to check your fasting blood sugar level. This should be done once every 3 years, after age 45, if you are within normal weight and without risk factors for diabetes. Testing should be considered at a younger age or be carried out more frequently if you are overweight and have at least 1 risk factor for diabetes.  Breast cancer screening is essential preventive care for women. You should practice "breast self-awareness." This means understanding the normal appearance and feel of your breasts and may include breast self-examination. Any changes detected, no matter how small, should be reported to a caregiver. Women in their 20s and 30s should have a clinical breast exam (CBE) by a caregiver as part of a regular health exam every 1 to 3 years. After age 40, women should have a CBE every year. Starting at age 40, women should consider having a mammography (breast X-ray test) every year. Women who have a family history of breast cancer should talk to their caregiver about genetic screening. Women at a high risk of breast cancer should talk to their caregivers about having magnetic resonance imaging (MRI) and a mammography every year.  Breast cancer gene (BRCA)-related cancer risk assessment is recommended for women who have family members with BRCA-related cancers. BRCA-related cancers include breast, ovarian, tubal, and peritoneal cancers. Having family members with these cancers may be associated with an increased risk for harmful changes (mutations) in the breast cancer genes BRCA1 and BRCA2. Results of the assessment will determine the need for genetic counseling and BRCA1 and BRCA2 testing.  The Pap test is  a screening test for cervical cancer. A Pap test can show cell changes on the cervix that might become cervical cancer if left untreated. A Pap test is a procedure in which cells are obtained and examined from the lower end of the uterus (cervix).  Women should have a Pap test starting at age 21.  Between ages 21 and 29, Pap tests should be repeated every 2 years.  Beginning at age 30, you should have a Pap test every 3 years as long as the past 3 Pap tests have been normal.  Some women have medical problems that increase the chance of getting cervical cancer. Talk to your caregiver about these problems. It is especially important to talk to your caregiver if a new problem develops soon after your last Pap test. In these cases, your caregiver may recommend more frequent screening and Pap tests.  The above recommendations are the same for women who have or have not gotten the vaccine for human papillomavirus (HPV).  If you had a hysterectomy for a problem that was not cancer or a condition that could lead to cancer, then   you no longer need Pap tests. Even if you no longer need a Pap test, a regular exam is a good idea to make sure no other problems are starting.  If you are between ages 79 and 19, and you have had normal Pap tests going back 10 years, you no longer need Pap tests. Even if you no longer need a Pap test, a regular exam is a good idea to make sure no other problems are starting.  If you have had past treatment for cervical cancer or a condition that could lead to cancer, you need Pap tests and screening for cancer for at least 20 years after your treatment.  If Pap tests have been discontinued, risk factors (such as a new sexual partner) need to be reassessed to determine if screening should be resumed.  The HPV test is an additional test that may be used for cervical cancer screening. The HPV test looks for the virus that can cause the cell changes on the cervix. The cells collected  during the Pap test can be tested for HPV. The HPV test could be used to screen women aged 90 years and older, and should be used in women of any age who have unclear Pap test results. After the age of 9, women should have HPV testing at the same frequency as a Pap test.  Colorectal cancer can be detected and often prevented. Most routine colorectal cancer screening begins at the age of 63 and continues through age 61. However, your caregiver may recommend screening at an earlier age if you have risk factors for colon cancer. On a yearly basis, your caregiver may provide home test kits to check for hidden blood in the stool. Use of a small camera at the end of a tube, to directly examine the colon (sigmoidoscopy or colonoscopy), can detect the earliest forms of colorectal cancer. Talk to your caregiver about this at age 32, when routine screening begins. Direct examination of the colon should be repeated every 5 to 10 years through age 7, unless early forms of pre-cancerous polyps or small growths are found.  Hepatitis C blood testing is recommended for all people born from [birth year] through 1965 and any individual with known risks for hepatitis C.  Practice safe sex. Use condoms and avoid high-risk sexual practices to reduce the spread of sexually transmitted infections (STIs). STIs include gonorrhea, chlamydia, syphilis, trichomonas, herpes, HPV, and human immunodeficiency virus (HIV). Herpes, HIV, and HPV are viral illnesses that have no cure. They can result in disability, cancer, and death. Sexually active women aged 7 and younger should be checked for chlamydia. Older women with new or multiple partners should also be tested for chlamydia. Testing for other STIs is recommended if you are sexually active and at increased risk.  Osteoporosis is a disease in which the bones lose minerals and strength with aging. This can result in serious bone fractures. The risk of osteoporosis can be identified using a  bone density scan. Women ages 30 and over and women at risk for fractures or osteoporosis should discuss screening with their caregivers. Ask your caregiver whether you should take a calcium supplement or vitamin D to reduce the rate of osteoporosis.  Menopause can be associated with physical symptoms and risks. Hormone replacement therapy is available to decrease symptoms and risks. You should talk to your caregiver about whether hormone replacement therapy is right for you.  Use sunscreen. Apply sunscreen liberally and repeatedly throughout the day. You should seek shade  when your shadow is shorter than you. Protect yourself by wearing long sleeves, pants, a wide-brimmed hat, and sunglasses year round, whenever you are outdoors.  Once a month, do a whole body skin exam, using a mirror to look at the skin on your back. Notify your caregiver of new moles, moles that have irregular borders, moles that are larger than a pencil eraser, or moles that have changed in shape or color.  Stay current with required immunizations.  Influenza vaccine. All adults should be immunized every year.  Tetanus, diphtheria, and acellular pertussis (Td, Tdap) vaccine. Pregnant women should receive 1 dose of Tdap vaccine during each pregnancy. The dose should be obtained regardless of the length of time since the last dose. Immunization is preferred during the 27th to 36th week of gestation. An adult who has not previously received Tdap or who does not know her vaccine status should receive 1 dose of Tdap. This initial dose should be followed by tetanus and diphtheria toxoids (Td) booster doses every 10 years. Adults with an unknown or incomplete history of completing a 3-dose immunization series with Td-containing vaccines should begin or complete a primary immunization series including a Tdap dose. Adults should receive a Td booster every 10 years.  Varicella vaccine. An adult without evidence of immunity to varicella  should receive 2 doses or a second dose if she has previously received 1 dose. Pregnant females who do not have evidence of immunity should receive the first dose after pregnancy. This first dose should be obtained before leaving the health care facility. The second dose should be obtained 4 8 weeks after the first dose.  Human papillomavirus (HPV) vaccine. Females aged 13 26 years who have not received the vaccine previously should obtain the 3-dose series. The vaccine is not recommended for use in pregnant females. However, pregnancy testing is not needed before receiving a dose. If a female is found to be pregnant after receiving a dose, no treatment is needed. In that case, the remaining doses should be delayed until after the pregnancy. Immunization is recommended for any person with an immunocompromised condition through the age of 26 years if she did not get any or all doses earlier. During the 3-dose series, the second dose should be obtained 4 8 weeks after the first dose. The third dose should be obtained 24 weeks after the first dose and 16 weeks after the second dose.  Zoster vaccine. One dose is recommended for adults aged 60 years or older unless certain conditions are present.  Measles, mumps, and rubella (MMR) vaccine. Adults born before 1957 generally are considered immune to measles and mumps. Adults born in 1957 or later should have 1 or more doses of MMR vaccine unless there is a contraindication to the vaccine or there is laboratory evidence of immunity to each of the three diseases. A routine second dose of MMR vaccine should be obtained at least 28 days after the first dose for students attending postsecondary schools, health care workers, or international travelers. People who received inactivated measles vaccine or an unknown type of measles vaccine during 1963 1967 should receive 2 doses of MMR vaccine. People who received inactivated mumps vaccine or an unknown type of mumps vaccine  before 1979 and are at high risk for mumps infection should consider immunization with 2 doses of MMR vaccine. For females of childbearing age, rubella immunity should be determined. If there is no evidence of immunity, females who are not pregnant should be vaccinated. If there   is no evidence of immunity, females who are pregnant should delay immunization until after pregnancy. Unvaccinated health care workers born before 1957 who lack laboratory evidence of measles, mumps, or rubella immunity or laboratory confirmation of disease should consider measles and mumps immunization with 2 doses of MMR vaccine or rubella immunization with 1 dose of MMR vaccine.  Pneumococcal 13-valent conjugate (PCV13) vaccine. When indicated, a person who is uncertain of her immunization history and has no record of immunization should receive the PCV13 vaccine. An adult aged 19 years or older who has certain medical conditions and has not been previously immunized should receive 1 dose of PCV13 vaccine. This PCV13 should be followed with a dose of pneumococcal polysaccharide (PPSV23) vaccine. The PPSV23 vaccine dose should be obtained at least 8 weeks after the dose of PCV13 vaccine. An adult aged 19 years or older who has certain medical conditions and previously received 1 or more doses of PPSV23 vaccine should receive 1 dose of PCV13. The PCV13 vaccine dose should be obtained 1 or more years after the last PPSV23 vaccine dose.  Pneumococcal polysaccharide (PPSV23) vaccine. When PCV13 is also indicated, PCV13 should be obtained first. All adults aged 65 years and older should be immunized. An adult younger than age 65 years who has certain medical conditions should be immunized. Any person who resides in a nursing home or long-term care facility should be immunized. An adult smoker should be immunized. People with an immunocompromised condition and certain other conditions should receive both PCV13 and PPSV23 vaccines. People  with human immunodeficiency virus (HIV) infection should be immunized as soon as possible after diagnosis. Immunization during chemotherapy or radiation therapy should be avoided. Routine use of PPSV23 vaccine is not recommended for American Indians, [place] Natives, or people younger than 65 years unless there are medical conditions that require PPSV23 vaccine. When indicated, people who have unknown immunization and have no record of immunization should receive PPSV23 vaccine. One-time revaccination 5 years after the first dose of PPSV23 is recommended for people aged 19 64 years who have chronic kidney failure, nephrotic syndrome, asplenia, or immunocompromised conditions. People who received 1 2 doses of PPSV23 before age 65 years should receive another dose of PPSV23 vaccine at age 65 years or later if at least 5 years have passed since the previous dose. Doses of PPSV23 are not needed for people immunized with PPSV23 at or after age 65 years.  Meningococcal vaccine. Adults with asplenia or persistent complement component deficiencies should receive 2 doses of quadrivalent meningococcal conjugate (MenACWY-D) vaccine. The doses should be obtained at least 2 months apart. Microbiologists working with certain meningococcal bacteria, military recruits, people at risk during an outbreak, and people who travel to or live in countries with a high rate of meningitis should be immunized. A first-year college student up through age 21 years who is living in a residence hall should receive a dose if she did not receive a dose on or after her 16th birthday. Adults who have certain high-risk conditions should receive one or more doses of vaccine.  Hepatitis A vaccine. Adults who wish to be protected from this disease, have certain high-risk conditions, work with hepatitis A-infected animals, work in hepatitis A research labs, or travel to or work in countries with a high rate of hepatitis A should be immunized. Adults  who were previously unvaccinated and who anticipate close contact with an international adoptee during the first 60 days after arrival in the [place] from a country   with a high rate of hepatitis A should be immunized.  Hepatitis B vaccine. Adults who wish to be protected from this disease, have certain high-risk conditions, may be exposed to blood or other infectious body fluids, are household contacts or sex partners of hepatitis B positive people, are clients or workers in certain care facilities, or travel to or work in countries with a high rate of hepatitis B should be immunized.  Haemophilus influenzae type b (Hib) vaccine. A previously unvaccinated person with asplenia or sickle cell disease or having a scheduled splenectomy should receive 1 dose of Hib vaccine. Regardless of previous immunization, a recipient of a hematopoietic stem cell transplant should receive a 3-dose series 6 12 months after her successful transplant. Hib vaccine is not recommended for adults with HIV infection. Preventive Services / Frequency Ages 19 to 39  Blood pressure check.** / Every 1 to 2 years.  Lipid and cholesterol check.** / Every 5 years beginning at age 20.  Clinical breast exam.** / Every 3 years for women in their 20s and 30s.  BRCA-related cancer risk assessment.** / For women who have family members with a BRCA-related cancer (breast, ovarian, tubal, or peritoneal cancers).  Pap test.** / Every 2 years from ages 21 through 29. Every 3 years starting at age 30 through age 65 or 70 with a history of 3 consecutive normal Pap tests.  HPV screening.** / Every 3 years from ages 30 through ages 65 to 70 with a history of 3 consecutive normal Pap tests.  Hepatitis C blood test.** / For any individual with known risks for hepatitis C.  Skin self-exam. / Monthly.  Influenza vaccine. / Every year.  Tetanus, diphtheria, and acellular pertussis (Tdap, Td) vaccine.** / Consult your caregiver. Pregnant  women should receive 1 dose of Tdap vaccine during each pregnancy. 1 dose of Td every 10 years.  Varicella vaccine.** / Consult your caregiver. Pregnant females who do not have evidence of immunity should receive the first dose after pregnancy.  HPV vaccine. / 3 doses over 6 months, if 26 and younger. The vaccine is not recommended for use in pregnant females. However, pregnancy testing is not needed before receiving a dose.  Measles, mumps, rubella (MMR) vaccine.** / You need at least 1 dose of MMR if you were born in 1957 or later. You may also need a 2nd dose. For females of childbearing age, rubella immunity should be determined. If there is no evidence of immunity, females who are not pregnant should be vaccinated. If there is no evidence of immunity, females who are pregnant should delay immunization until after pregnancy.  Pneumococcal 13-valent conjugate (PCV13) vaccine.** / Consult your caregiver.  Pneumococcal polysaccharide (PPSV23) vaccine.** / 1 to 2 doses if you smoke cigarettes or if you have certain conditions.  Meningococcal vaccine.** / 1 dose if you are age 19 to 21 years and a first-year college student living in a residence hall, or have one of several medical conditions, you need to get vaccinated against meningococcal disease. You may also need additional booster doses.  Hepatitis A vaccine.** / Consult your caregiver.  Hepatitis B vaccine.** / Consult your caregiver.  Haemophilus influenzae type b (Hib) vaccine.** / Consult your caregiver. Ages 40 to 64  Blood pressure check.** / Every 1 to 2 years.  Lipid and cholesterol check.** / Every 5 years beginning at age 20.  Lung cancer screening. / Every year if you are aged 55 80 years and have a 30-pack-year history of smoking and   currently smoke or have quit within the past 15 years. Yearly screening is stopped once you have quit smoking for at least 15 years or develop a health problem that would prevent you from having  lung cancer treatment.  Clinical breast exam.** / Every year after age 40.  BRCA-related cancer risk assessment.** / For women who have family members with a BRCA-related cancer (breast, ovarian, tubal, or peritoneal cancers).  Mammogram.** / Every year beginning at age 40 and continuing for as long as you are in good health. Consult with your caregiver.  Pap test.** / Every 3 years starting at age 30 through age 65 or 70 with a history of 3 consecutive normal Pap tests.  HPV screening.** / Every 3 years from ages 30 through ages 65 to 70 with a history of 3 consecutive normal Pap tests.  Fecal occult blood test (FOBT) of stool. / Every year beginning at age 50 and continuing until age 75. You may not need to do this test if you get a colonoscopy every 10 years.  Flexible sigmoidoscopy or colonoscopy.** / Every 5 years for a flexible sigmoidoscopy or every 10 years for a colonoscopy beginning at age 50 and continuing until age 75.  Hepatitis C blood test.** / For all people born from 1945 through 1965 and any individual with known risks for hepatitis C.  Skin self-exam. / Monthly.  Influenza vaccine. / Every year.  Tetanus, diphtheria, and acellular pertussis (Tdap/Td) vaccine.** / Consult your caregiver. Pregnant women should receive 1 dose of Tdap vaccine during each pregnancy. 1 dose of Td every 10 years.  Varicella vaccine.** / Consult your caregiver. Pregnant females who do not have evidence of immunity should receive the first dose after pregnancy.  Zoster vaccine.** / 1 dose for adults aged 60 years or older.  Measles, mumps, rubella (MMR) vaccine.** / You need at least 1 dose of MMR if you were born in 1957 or later. You may also need a 2nd dose. For females of childbearing age, rubella immunity should be determined. If there is no evidence of immunity, females who are not pregnant should be vaccinated. If there is no evidence of immunity, females who are pregnant should delay  immunization until after pregnancy.  Pneumococcal 13-valent conjugate (PCV13) vaccine.** / Consult your caregiver.  Pneumococcal polysaccharide (PPSV23) vaccine.** / 1 to 2 doses if you smoke cigarettes or if you have certain conditions.  Meningococcal vaccine.** / Consult your caregiver.  Hepatitis A vaccine.** / Consult your caregiver.  Hepatitis B vaccine.** / Consult your caregiver.  Haemophilus influenzae type b (Hib) vaccine.** / Consult your caregiver. Ages 65 and over  Blood pressure check.** / Every 1 to 2 years.  Lipid and cholesterol check.** / Every 5 years beginning at age 20.  Lung cancer screening. / Every year if you are aged 55 80 years and have a 30-pack-year history of smoking and currently smoke or have quit within the past 15 years. Yearly screening is stopped once you have quit smoking for at least 15 years or develop a health problem that would prevent you from having lung cancer treatment.  Clinical breast exam.** / Every year after age 40.  BRCA-related cancer risk assessment.** / For women who have family members with a BRCA-related cancer (breast, ovarian, tubal, or peritoneal cancers).  Mammogram.** / Every year beginning at age 40 and continuing for as long as you are in good health. Consult with your caregiver.  Pap test.** / Every 3 years starting at age   30 through age 43 or 85 with a 3 consecutive normal Pap tests. Testing can be stopped between 65 and 70 with 3 consecutive normal Pap tests and no abnormal Pap or HPV tests in the past 10 years.  HPV screening.** / Every 3 years from ages 31 through ages 102 or 66 with a history of 3 consecutive normal Pap tests. Testing can be stopped between 65 and 70 with 3 consecutive normal Pap tests and no abnormal Pap or HPV tests in the past 10 years.  Fecal occult blood test (FOBT) of stool. / Every year beginning at age 10 and continuing until age 35. You may not need to do this test if you get a colonoscopy  every 10 years.  Flexible sigmoidoscopy or colonoscopy.** / Every 5 years for a flexible sigmoidoscopy or every 10 years for a colonoscopy beginning at age 24 and continuing until age 73.  Hepatitis C blood test.** / For all people born from [birth year] through 1965 and any individual with known risks for hepatitis C.  Osteoporosis screening.** / A one-time screening for women ages 47 and over and women at risk for fractures or osteoporosis.  Skin self-exam. / Monthly.  Influenza vaccine. / Every year.  Tetanus, diphtheria, and acellular pertussis (Tdap/Td) vaccine.** / 1 dose of Td every 10 years.  Varicella vaccine.** / Consult your caregiver.  Zoster vaccine.** / 1 dose for adults aged 36 years or older.  Pneumococcal 13-valent conjugate (PCV13) vaccine.** / Consult your caregiver.  Pneumococcal polysaccharide (PPSV23) vaccine.** / 1 dose for all adults aged 38 years and older.  Meningococcal vaccine.** / Consult your caregiver.  Hepatitis A vaccine.** / Consult your caregiver.  Hepatitis B vaccine.** / Consult your caregiver.  Haemophilus influenzae type b (Hib) vaccine.** / Consult your caregiver. ** Family history and personal history of risk and conditions may change your caregiver's recommendations. Document Released: 04/07/2001 Document Revised: 06/06/2012 Document Reviewed: 07/07/2010 [facility] Patient Information 2014 [employer], [place]. .  Thank you for enrolling in MyChart. Please follow the instructions below to securely access your online medical record. MyChart allows you to send messages to your doctor, view your test results, manage appointments, and more.   How Do I Sign Up? 1. In your Internet browser, go to [employer] and enter [email or web address]. 2. Click on the Sign Up Now link in the Sign In box. You will see the New Member Sign Up page. 3. Enter your MyChart Access Code exactly as it appears below. You will not need to use this code after you've  completed the sign-up process. If you do not sign up before the expiration date, you must request a new code.  MyChart Access Code: [email or web address] Expires: 04/08/2013  3:51 PM  4. Enter your Social Security Number ([email or web address]) and Date of Birth (mm/dd/yyyy) as indicated and click Submit. You will be taken to the next sign-up page. 5. Create a MyChart ID. This will be your MyChart login ID and cannot be changed, so think of one that is secure and easy to remember. 6. Create a MyChart password. You can change your password at any time. 7. Enter your Password Reset Question and Answer. This can be used at a later time if you forget your password.  8. Enter your e-mail address. You will receive e-mail notification when new information is available in MyChart. 9. Click Sign Up. You can now view your medical record.   Additional Information Remember, MyChart is NOT to be used for urgent needs. For medical  emergencies, dial 911.

## 2013-02-23 NOTE — L&D Delivery Note (Addendum)
Delivery Note At 11:48 PM a viable female was delivered via Vaginal, Spontaneous Delivery (Presentation: Right Occiput Anterior).  APGAR: , ; weight pending.   Placenta status: Intact, Spontaneous.  Cord: 3 vessels with the following complications: None.  Cord pH: n/a  Anesthesia: Epidural  Episiotomy: None Lacerations: 1st degree Suture Repair: 3.0 vicryl Est. Blood Loss (mL): 250  Mom to postpartum.  Baby to Couplet care / Skin to Skin.  [name] 12/17/2013, 12:36 AM

## 2013-02-23 NOTE — L&D Delivery Note (Deleted)
Delivery by RNs- states infant came very rapidly after we were called. I arrived immediately after birth, infant vigorous w/ good cry and respirations, wiped off by RN and placed skin-to-skin w/ mom.  Cord allowed to stop pulsing, and was clamped x 2, and cut by family member.  Placenta delivered and 2nd degree lac repaired by Dr. Jerline Pain.  Plans to breastfeed, POPs for contraception, OP circumcision.  Roma Schanz, CNM, [facility] 12/17/2013 4:46 AM

## 2013-02-23 NOTE — L&D Delivery Note (Signed)
Birth by Dr. Jerline Pain w/ my direct supervision. Infant was crowned up for a little bit, pt had h/o what she describes as shoulder dystocia w/ last pregnancy, so called Dr. Roselie Awkward to be in attendance of delivery as well. Anterior shoulder delivered w/o difficulty, Dr. Jerline Pain unable to deliver posterior shoulder, so I hooked underneath anterior shoulder and w/ maternal pushing efforts posterior shoulder was delivered w/o difficulty. Infant placed directly on mom's abdomen for bonding/skin-to-skin. Cord allowed to stop pulsing, and was clamped x 2, and cut by fob.  Repair by Dr. Jerline Pain w/ my supervision.  Plans to bottlefeed, interval BTL w/ umbilical hernia repair.  Please disregard cosign note written by me @ [phone]- this was charted on wrong pt.  Roma Schanz, CNM, [facility] 12/17/2013 6:27 AM

## 2013-04-19 ENCOUNTER — Ambulatory Visit (INDEPENDENT_AMBULATORY_CARE_PROVIDER_SITE_OTHER): Payer: 59 | Admitting: *Deleted

## 2013-04-19 ENCOUNTER — Encounter: Payer: Self-pay | Admitting: *Deleted

## 2013-04-19 VITALS — BP 121/72 | Wt 131.0 lb

## 2013-04-19 DIAGNOSIS — Z3400 Encounter for supervision of normal first pregnancy, unspecified trimester: Secondary | ICD-10-CM

## 2013-04-19 DIAGNOSIS — Z3401 Encounter for supervision of normal first pregnancy, first trimester: Secondary | ICD-10-CM

## 2013-04-19 LAB — OB RESULTS CONSOLE GBS: STREP GROUP B AG: POSITIVE

## 2013-04-19 NOTE — Progress Notes (Signed)
[id number]

## 2013-04-20 LAB — OBSTETRIC PANEL
Antibody Screen: NEGATIVE
BASOS ABS: 0 10*3/uL (ref 0.0–0.1)
Basophils Relative: 0 % (ref 0–1)
Eosinophils Absolute: 0.1 10*3/uL (ref 0.0–0.7)
Eosinophils Relative: 1 % (ref 0–5)
HEMATOCRIT: 36.8 % (ref 36.0–46.0)
Hemoglobin: 12.2 g/dL (ref 12.0–15.0)
Hepatitis B Surface Ag: NEGATIVE
LYMPHS ABS: 2.4 10*3/uL (ref 0.7–4.0)
LYMPHS PCT: 21 % (ref 12–46)
MCH: 30.7 pg (ref 26.0–34.0)
MCHC: 33.2 g/dL (ref 30.0–36.0)
MCV: 92.7 fL (ref 78.0–100.0)
MONO ABS: 0.7 10*3/uL (ref 0.1–1.0)
Monocytes Relative: 6 % (ref 3–12)
NEUTROS ABS: 8.1 10*3/uL — AB (ref 1.7–7.7)
Neutrophils Relative %: 72 % (ref 43–77)
Platelets: 260 10*3/uL (ref 150–400)
RBC: 3.97 MIL/uL (ref 3.87–5.11)
RDW: 13.3 % (ref 11.5–15.5)
RUBELLA: 2.61 {index} — AB (ref <0.90)
Rh Type: POSITIVE
WBC: 11.3 10*3/uL — AB (ref 4.0–10.5)

## 2013-04-20 LAB — GC/CHLAMYDIA PROBE AMP, URINE
Chlamydia, Swab/Urine, PCR: NEGATIVE
GC Probe Amp, Urine: NEGATIVE

## 2013-04-20 LAB — HIV ANTIBODY (ROUTINE TESTING W REFLEX): HIV: NONREACTIVE

## 2013-04-21 LAB — CYSTIC FIBROSIS DIAGNOSTIC STUDY

## 2013-04-22 LAB — CULTURE, OB URINE: Colony Count: 2000

## 2013-04-23 ENCOUNTER — Encounter: Payer: Self-pay | Admitting: Obstetrics & Gynecology

## 2013-04-23 DIAGNOSIS — O99820 Streptococcus B carrier state complicating pregnancy: Secondary | ICD-10-CM | POA: Insufficient documentation

## 2013-04-23 DIAGNOSIS — Z3490 Encounter for supervision of normal pregnancy, unspecified, unspecified trimester: Secondary | ICD-10-CM | POA: Insufficient documentation

## 2013-05-02 ENCOUNTER — Encounter: Payer: Self-pay | Admitting: Family Medicine

## 2013-05-02 ENCOUNTER — Ambulatory Visit (INDEPENDENT_AMBULATORY_CARE_PROVIDER_SITE_OTHER): Payer: 59 | Admitting: Family Medicine

## 2013-05-02 VITALS — BP 111/70 | Wt 131.8 lb

## 2013-05-02 DIAGNOSIS — Z3490 Encounter for supervision of normal pregnancy, unspecified, unspecified trimester: Secondary | ICD-10-CM

## 2013-05-02 DIAGNOSIS — Z3480 Encounter for supervision of other normal pregnancy, unspecified trimester: Secondary | ICD-10-CM

## 2013-05-02 NOTE — Progress Notes (Signed)
   Subjective:    [name] is a G2P1001 [redacted]w[redacted]d being seen today for her first obstetrical visit.  Her obstetrical history is significant for no high risk factors.  Pregnancy history fully reviewed.  Patient reports fatigue.  Normal annual exam 12/14.  Filed Vitals:   05/02/13 1620  BP: 111/70  Weight: 131 lb 12.8 oz (59.784 kg)    HISTORY: OB History  Gravida Para Term Preterm AB SAB TAB Ectopic Multiple Living  2 1 1       1     # Outcome Date GA Lbr Len/2nd Weight Sex Delivery Anes PTL Lv  2 CUR           1 TRM 09/15/09 [redacted]w[redacted]d  8 lb 9 oz (3.884 kg) F SVD   Y     Past Medical History  Diagnosis Date  . Hyperlipidemia   . Celiac disease   . Uterine prolapse   . Basal cell carcinoma of nasolabial crease 2014   Past Surgical History  Procedure Laterality Date  . Tonsillectomy and adenoidectomy  1995   Family History  Problem Relation Age of Onset  . Hypothyroidism Mother   . Hyperlipidemia Father   . Celiac disease Sister   . Hashimoto's thyroiditis Sister   . Heart disease Paternal Grandfather 57    multiple CABG  . Hyperlipidemia Paternal Grandfather   . Cancer Paternal Grandmother     pancreatic cancer     Exam    Uterus:   8 wk size  Pelvic Exam:    Perineum: Normal Perineum   Vulva: Bartholin's, Urethra, Skene's normal   Vagina:  normal mucosa   Cervix: no lesions   Adnexa: normal adnexa   Bony Pelvis: average  System: Breast:  normal appearance, no masses or tenderness   Skin: normal coloration and turgor, no rashes    Neurologic: oriented   Extremities: normal strength, tone, and muscle mass   HEENT sclera clear, anicteric   Mouth/Teeth mucous membranes moist, pharynx normal without lesions   Neck no masses   Cardiovascular: regular rate and rhythm, no murmurs or gallops   Respiratory:  appears well, vitals normal, no respiratory distress, acyanotic, normal RR, ear and throat exam is normal, neck free of mass or lymphadenopathy, chest  clear, no wheezing, crepitations, rhonchi, normal symmetric air entry   Abdomen: soft, non-tender; bowel sounds normal; no masses,  no organomegaly      Assessment:    Pregnancy: G2P1001 Patient Active Problem List   Diagnosis Date Noted  . Supervision of normal pregnancy 04/23/2013    Priority: High  . 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 First degree uterine prolapse 01/28/2012  . Hyperlipidemia   . Celiac disease     TVUS performed today and reveals a SIUP with FHR, CRL c/w dates 7 wk 6 days. Yolk sac and fetal pole noted.    Plan:     Initial labs reviewed. Prenatal vitamins. Problem list reviewed and updated.Genetic Screening discussed First Screen: declined. Ultrasound discussed; fetal survey: discussed. Follow up in 4 weeks.    [name] S 05/02/2013

## 2013-05-02 NOTE — Patient Instructions (Signed)
Pregnancy - First Trimester During sexual intercourse, millions of sperm go into the vagina. Only 1 sperm will penetrate and fertilize the female egg while it is in the Fallopian tube. One week later, the fertilized egg implants into the wall of the uterus. An embryo begins to develop into a baby. At 6 to 8 weeks, the eyes and face are formed and the heartbeat can be seen on ultrasound. At the end of 12 weeks (first trimester), all the baby's organs are formed. Now that you are pregnant, you will want to do everything you can to have a healthy baby. Two of the most important things are to get good prenatal care and follow your caregiver's instructions. Prenatal care is all the medical care you receive before the baby's birth. It is given to prevent, find, and treat problems during the pregnancy and childbirth. PRENATAL EXAMS  During prenatal visits, your weight, blood pressure, and urine are checked. This is done to make sure you are healthy and progressing normally during the pregnancy.  A pregnant woman should gain 25 to 35 pounds during the pregnancy. However, if you are overweight or underweight, your caregiver will advise you regarding your weight.  Your caregiver will ask and answer questions for you.  Blood work, cervical cultures, other necessary tests, and a Pap test are done during your prenatal exams. These tests are done to check on your health and the probable health of your baby. Tests are strongly recommended and done for HIV with your permission. This is the virus that causes AIDS. These tests are done because medicines can be given to help prevent your baby from being born with this infection should you have been infected without knowing it. Blood work is also used to find out your blood type, previous infections, and follow your blood levels (hemoglobin).  Low hemoglobin (anemia) is common during pregnancy. Iron and vitamins are given to help prevent this. Later in the pregnancy,  blood tests for diabetes will be done along with any other tests if any problems develop.  You may need other tests to make sure you and the baby are doing well. CHANGES DURING THE FIRST TRIMESTER  Your body goes through many changes during pregnancy. They vary from person to person. Talk to your caregiver about changes you notice and are concerned about. Changes can include:  Your menstrual period stops.  The egg and sperm carry the genes that determine what you look like. Genes from you and your partner are forming a baby. The female genes determine whether the baby is a boy or a girl.  Your body increases in girth and you may feel bloated.  Feeling sick to your stomach (nauseous) and throwing up (vomiting). If the vomiting is uncontrollable, call your caregiver.  Your breasts will begin to enlarge and become tender.  Your nipples may stick out more and become darker.  The need to urinate more. Painful urination may mean you have a bladder infection.  Tiring easily.  Loss of appetite.  Cravings for certain kinds of food.  At first, you may gain or lose a couple of pounds.  You may have changes in your emotions from day to day (excited to be pregnant or concerned something may go wrong with the pregnancy and baby).  You may have more vivid and strange dreams. HOME CARE INSTRUCTIONS   It is very important to avoid all smoking, alcohol and non-prescribed drugs during your pregnancy. These affect the formation and growth of the baby.   Avoid chemicals while pregnant to ensure the delivery of a healthy infant.  Start your prenatal visits by the 12th week of pregnancy. They are usually scheduled monthly at first, then more often in the last 2 months before delivery. Keep your caregiver's appointments. Follow your caregiver's instructions regarding medicine use, blood and lab tests, exercise, and diet.  During pregnancy, you are providing food for you and your baby. Eat regular,  well-balanced meals. Choose foods such as meat, fish, milk and other low fat dairy products, vegetables, fruits, and whole-grain breads and cereals. Your caregiver will tell you of the ideal weight gain.  You can help morning sickness by keeping soda crackers at the bedside. Eat a couple before arising in the morning. You may want to use the crackers without salt on them.  Eating 4 to 5 small meals rather than 3 large meals a day also may help the nausea and vomiting.  Drinking liquids between meals instead of during meals also seems to help nausea and vomiting.  A physical sexual relationship may be continued throughout pregnancy if there are no other problems. Problems may be early (premature) leaking of amniotic fluid from the membranes, vaginal bleeding, or belly (abdominal) pain.  Exercise regularly if there are no restrictions. Check with your caregiver or physical therapist if you are unsure of the safety of some of your exercises. Greater weight gain will occur in the last 2 trimesters of pregnancy. Exercising will help:  Control your weight.  Keep you in shape.  Prepare you for labor and delivery.  Help you lose your pregnancy weight after you deliver your baby.  Wear a good support or jogging bra for breast tenderness during pregnancy. This may help if worn during sleep too.  Ask when prenatal classes are available. Begin classes when they are offered.  Do not use hot tubs, steam rooms, or saunas.  Wear your seat belt when driving. This protects you and your baby if you are in an accident.  Avoid raw meat, uncooked cheese, cat litter boxes, and soil used by cats throughout the pregnancy. These carry germs that can cause birth defects in the baby.  The first trimester is a good time to visit your dentist for your dental health. Getting your teeth cleaned is okay. Use a softer toothbrush and brush gently during pregnancy.  Ask for help if you have financial, counseling, or  nutritional needs during pregnancy. Your caregiver will be able to offer counseling for these needs as well as refer you for other special needs.  Do not take any medicines or herbs unless told by your caregiver.  Inform your caregiver if there is any mental or physical domestic violence.  Make a list of emergency phone numbers of family, friends, hospital, and police and fire departments.  Write down your questions. Take them to your prenatal visit.  Do not douche.  Do not cross your legs.  If you have to stand for long periods of time, rotate you feet or take small steps in a circle.  You may have more vaginal secretions that may require a sanitary pad. Do not use tampons or scented sanitary pads. MEDICINES AND DRUG USE IN PREGNANCY  Take prenatal vitamins as directed. The vitamin should contain 1 milligram of folic acid. Keep all vitamins out of reach of children. Only a couple vitamins or tablets containing iron may be fatal to a baby or young child when ingested.  Avoid use of all medicines, including herbs, over-the-counter medicines, not   prescribed or suggested by your caregiver. Only take over-the-counter or prescription medicines for pain, discomfort, or fever as directed by your caregiver. Do not use aspirin, ibuprofen, or naproxen unless directed by your caregiver.  Let your caregiver also know about herbs you may be using.  Alcohol is related to a number of birth defects. This includes fetal alcohol syndrome. All alcohol, in any form, should be avoided completely. Smoking will cause low birth rate and premature babies.  Street or illegal drugs are very harmful to the baby. They are absolutely forbidden. A baby born to an addicted mother will be addicted at birth. The baby will go through the same withdrawal an adult does.  Let your caregiver know about any medicines that you have to take and for what reason you take them. SEEK MEDICAL CARE IF:  You have any concerns or  worries during your pregnancy. It is better to call with your questions if you feel they cannot wait, rather than worry about them. SEEK IMMEDIATE MEDICAL CARE IF:   An unexplained oral temperature above 102 F (38.9 C) develops, or as your caregiver suggests.  You have leaking of fluid from the vagina (birth canal). If leaking membranes are suspected, take your temperature and inform your caregiver of this when you call.  There is vaginal spotting or bleeding. Notify your caregiver of the amount and how many pads are used.  You develop a bad smelling vaginal discharge with a change in the color.  You continue to feel sick to your stomach (nauseated) and have no relief from remedies suggested. You vomit blood or coffee ground-like materials.  You lose more than 2 pounds of weight in 1 week.  You gain more than 2 pounds of weight in 1 week and you notice swelling of your face, hands, feet, or legs.  You gain 5 pounds or more in 1 week (even if you do not have swelling of your hands, face, legs, or feet).  You get exposed to German measles and have never had them.  You are exposed to fifth disease or chickenpox.  You develop belly (abdominal) pain. Round ligament discomfort is a common non-cancerous (benign) cause of abdominal pain in pregnancy. Your caregiver still must evaluate this.  You develop headache, fever, diarrhea, pain with urination, or shortness of breath.  You fall or are in a car accident or have any kind of trauma.  There is mental or physical violence in your home. Document Released: 02/03/2001 Document Revised: 11/04/2011 Document Reviewed: 08/07/2008 [employer] Patient Information 2014 [employer].  Breastfeeding Deciding to breastfeed is one of the best choices you can make for you and your baby. A change in hormones during pregnancy causes your breast tissue to grow and increases the number and size of your milk ducts. These hormones also allow proteins,  sugars, and fats from your blood supply to make breast milk in your milk-producing glands. Hormones prevent breast milk from being released before your baby is born as well as prompt milk flow after birth. Once breastfeeding has begun, thoughts of your baby, as well as his or her sucking or crying, can stimulate the release of milk from your milk-producing glands.  BENEFITS OF BREASTFEEDING For Your Baby  Your first milk (colostrum) helps your baby's digestive system function better.   There are antibodies in your milk that help your baby fight off infections.   Your baby has a lower incidence of asthma, allergies, and sudden infant death syndrome.   The nutrients   in breast milk are better for your baby than infant formulas and are designed uniquely for your baby's needs.   Breast milk improves your baby's brain development.   Your baby is less likely to develop other conditions, such as childhood obesity, asthma, or type 2 diabetes mellitus.  For You   Breastfeeding helps to create a very special bond between you and your baby.   Breastfeeding is convenient. Breast milk is always available at the correct temperature and costs nothing.   Breastfeeding helps to burn calories and helps you lose the weight gained during pregnancy.   Breastfeeding makes your uterus contract to its prepregnancy size faster and slows bleeding (lochia) after you give birth.   Breastfeeding helps to lower your risk of developing type 2 diabetes mellitus, osteoporosis, and breast or ovarian cancer later in life. SIGNS THAT YOUR BABY IS HUNGRY Early Signs of Hunger  Increased alertness or activity.  Stretching.  Movement of the head from side to side.  Movement of the head and opening of the mouth when the corner of the mouth or cheek is stroked (rooting).  Increased sucking sounds, smacking lips, cooing, sighing, or squeaking.  Hand-to-mouth movements.  Increased sucking of fingers or  hands. Late Signs of Hunger  Fussing.  Intermittent crying. Extreme Signs of Hunger Signs of extreme hunger will require calming and consoling before your baby will be able to breastfeed successfully. Do not wait for the following signs of extreme hunger to occur before you initiate breastfeeding:   Restlessness.  A loud, strong cry.   Screaming. BREASTFEEDING BASICS Breastfeeding Initiation  Find a comfortable place to sit or lie down, with your neck and back well supported.  Place a pillow or rolled up blanket under your baby to bring him or her to the level of your breast (if you are seated). Nursing pillows are specially designed to help support your arms and your baby while you breastfeed.  Make sure that your baby's abdomen is facing your abdomen.   Gently massage your breast. With your fingertips, massage from your chest wall toward your nipple in a circular motion. This encourages milk flow. You may need to continue this action during the feeding if your milk flows slowly.  Support your breast with 4 fingers underneath and your thumb above your nipple. Make sure your fingers are well away from your nipple and your baby's mouth.   Stroke your baby's lips gently with your finger or nipple.   When your baby's mouth is open wide enough, quickly bring your baby to your breast, placing your entire nipple and as much of the colored area around your nipple (areola) as possible into your baby's mouth.   More areola should be visible above your baby's upper lip than below the lower lip.   Your baby's tongue should be between his or her lower gum and your breast.   Ensure that your baby's mouth is correctly positioned around your nipple (latched). Your baby's lips should create a seal on your breast and be turned out (everted).  It is common for your baby to suck about 2 3 minutes in order to start the flow of breast milk. Latching Teaching your baby how to latch on to your  breast properly is very important. An improper latch can cause nipple pain and decreased milk supply for you and poor weight gain in your baby. Also, if your baby is not latched onto your nipple properly, he or she may swallow some air during   feeding. This can make your baby fussy. Burping your baby when you switch breasts during the feeding can help to get rid of the air. However, teaching your baby to latch on properly is still the best way to prevent fussiness from swallowing air while breastfeeding. Signs that your baby has successfully latched on to your nipple:    Silent tugging or silent sucking, without causing you pain.   Swallowing heard between every 3 4 sucks.    Muscle movement above and in front of his or her ears while sucking.  Signs that your baby has not successfully latched on to nipple:   Sucking sounds or smacking sounds from your baby while breastfeeding.  Nipple pain. If you think your baby has not latched on correctly, slip your finger into the corner of your baby's mouth to break the suction and place it between your baby's gums. Attempt breastfeeding initiation again. Signs of Successful Breastfeeding Signs from your baby:   A gradual decrease in the number of sucks or complete cessation of sucking.   Falling asleep.   Relaxation of his or her body.   Retention of a small amount of milk in his or her mouth.   Letting go of your breast by himself or herself. Signs from you:  Breasts that have increased in firmness, weight, and size 1 3 hours after feeding.   Breasts that are softer immediately after breastfeeding.  Increased milk volume, as well as a change in milk consistency and color by the 5th day of breastfeeding.   Nipples that are not sore, cracked, or bleeding. Signs That Your Baby is Getting Enough Milk  Wetting at least 3 diapers in a 24-hour period. The urine should be clear and pale yellow by age 5 days.  At least 3 stools in a  24-hour period by age 5 days. The stool should be soft and yellow.  At least 3 stools in a 24-hour period by age 7 days. The stool should be seedy and yellow.  No loss of weight greater than 10% of birth weight during the first 3 days of age.  Average weight gain of 4 7 ounces (120 210 mL) per week after age 4 days.  Consistent daily weight gain by age 5 days, without weight loss after the age of 2 weeks. After a feeding, your baby may spit up a small amount. This is common. BREASTFEEDING FREQUENCY AND DURATION Frequent feeding will help you make more milk and can prevent sore nipples and breast engorgement. Breastfeed when you feel the need to reduce the fullness of your breasts or when your baby shows signs of hunger. This is called "breastfeeding on demand." Avoid introducing a pacifier to your baby while you are working to establish breastfeeding (the first 4 6 weeks after your baby is born). After this time you may choose to use a pacifier. Research has shown that pacifier use during the first year of a baby's life decreases the risk of sudden infant death syndrome (SIDS). Allow your baby to feed on each breast as long as he or she wants. Breastfeed until your baby is finished feeding. When your baby unlatches or falls asleep while feeding from the first breast, offer the second breast. Because newborns are often sleepy in the first few weeks of life, you may need to awaken your baby to get him or her to feed. Breastfeeding times will vary from baby to baby. However, the following rules can serve as a guide to help you   ensure that your baby is properly fed:  Newborns (babies 4 weeks of age or younger) may breastfeed every 1 3 hours.  Newborns should not go longer than 3 hours during the day or 5 hours during the night without breastfeeding.  You should breastfeed your baby a minimum of 8 times in a 24-hour period until you begin to introduce solid foods to your baby at around 6 months of  age. BREAST MILK PUMPING Pumping and storing breast milk allows you to ensure that your baby is exclusively fed your breast milk, even at times when you are unable to breastfeed. This is especially important if you are going back to work while you are still breastfeeding or when you are not able to be present during feedings. Your lactation consultant can give you guidelines on how long it is safe to store breast milk.  A breast pump is a machine that allows you to pump milk from your breast into a sterile bottle. The pumped breast milk can then be stored in a refrigerator or freezer. Some breast pumps are operated by hand, while others use electricity. Ask your lactation consultant which type will work best for you. Breast pumps can be purchased, but some hospitals and breastfeeding support groups lease breast pumps on a monthly basis. A lactation consultant can teach you how to hand express breast milk, if you prefer not to use a pump.  CARING FOR YOUR BREASTS WHILE YOU BREASTFEED Nipples can become dry, cracked, and sore while breastfeeding. The following recommendations can help keep your breasts moisturized and healthy:  Avoid using soap on your nipples.   Wear a supportive bra. Although not required, special nursing bras and tank tops are designed to allow access to your breasts for breastfeeding without taking off your entire bra or top. Avoid wearing underwire style bras or extremely tight bras.  Air dry your nipples for 3 4minutes after each feeding.   Use only cotton bra pads to absorb leaked breast milk. Leaking of breast milk between feedings is normal.   Use lanolin on your nipples after breastfeeding. Lanolin helps to maintain your skin's normal moisture barrier. If you use pure lanolin you do not need to wash it off before feeding your baby again. Pure lanolin is not toxic to your baby. You may also hand express a few drops of breast milk and gently massage that milk into your  nipples and allow the milk to air dry. In the first few weeks after giving birth, some women experience extremely full breasts (engorgement). Engorgement can make your breasts feel heavy, warm, and tender to the touch. Engorgement peaks within 3 5 days after you give birth. The following recommendations can help ease engorgement:  Completely empty your breasts while breastfeeding or pumping. You may want to start by applying warm, moist heat (in the shower or with warm water-soaked hand towels) just before feeding or pumping. This increases circulation and helps the milk flow. If your baby does not completely empty your breasts while breastfeeding, pump any extra milk after he or she is finished.  Wear a snug bra (nursing or regular) or tank top for 1 2 days to signal your body to slightly decrease milk production.  Apply ice packs to your breasts, unless this is too uncomfortable for you.  Make sure that your baby is latched on and positioned properly while breastfeeding. If engorgement persists after 48 hours of following these recommendations, contact your health care provider or a lactation consultant. OVERALL   HEALTH CARE RECOMMENDATIONS WHILE BREASTFEEDING  Eat healthy foods. Alternate between meals and snacks, eating 3 of each per day. Because what you eat affects your breast milk, some of the foods may make your baby more irritable than usual. Avoid eating these foods if you are sure that they are negatively affecting your baby.  Drink milk, fruit juice, and water to satisfy your thirst (about 10 glasses a day).   Rest often, relax, and continue to take your prenatal vitamins to prevent fatigue, stress, and anemia.  Continue breast self-awareness checks.  Avoid chewing and smoking tobacco.  Avoid alcohol and drug use. Some medicines that may be harmful to your baby can pass through breast milk. It is important to ask your health care provider before taking any medicine, including all  over-the-counter and prescription medicine as well as vitamin and herbal supplements. It is possible to become pregnant while breastfeeding. If birth control is desired, ask your health care provider about options that will be safe for your baby. SEEK MEDICAL CARE IF:   You feel like you want to stop breastfeeding or have become frustrated with breastfeeding.  You have painful breasts or nipples.  Your nipples are cracked or bleeding.  Your breasts are red, tender, or warm.  You have a swollen area on either breast.  You have a fever or chills.  You have nausea or vomiting.  You have drainage other than breast milk from your nipples.  Your breasts do not become full before feedings by the 5th day after you give birth.  You feel sad and depressed.  Your baby is too sleepy to eat well.  Your baby is having trouble sleeping.   Your baby is wetting less than 3 diapers in a 24-hour period.  Your baby has less than 3 stools in a 24-hour period.  Your baby's skin or the white part of his or her eyes becomes yellow.   Your baby is not gaining weight by 5 days of age. SEEK IMMEDIATE MEDICAL CARE IF:   Your baby is overly tired (lethargic) and does not want to wake up and feed.  Your baby develops an unexplained fever. Document Released: 02/09/2005 Document Revised: 10/12/2012 Document Reviewed: 08/03/2012 [employer] Patient Information 2014 [employer].  

## 2013-05-02 NOTE — Progress Notes (Signed)
[phone]

## 2013-05-09 ENCOUNTER — Other Ambulatory Visit: Payer: Self-pay | Admitting: *Deleted

## 2013-05-09 DIAGNOSIS — R112 Nausea with vomiting, unspecified: Secondary | ICD-10-CM

## 2013-05-09 MED ORDER — ONDANSETRON HCL 4 MG PO TABS: 4.0000 mg | ORAL_TABLET | Freq: Three times a day (TID) | ORAL | Status: DC | PRN

## 2013-05-09 NOTE — Telephone Encounter (Signed)
Pt called and is having a lot of nausea and would like something called in.  I have sent in Zofran.  Pt aware.

## 2013-05-30 ENCOUNTER — Ambulatory Visit (INDEPENDENT_AMBULATORY_CARE_PROVIDER_SITE_OTHER): Payer: 59 | Admitting: Family Medicine

## 2013-05-30 ENCOUNTER — Encounter: Payer: Self-pay | Admitting: Family Medicine

## 2013-05-30 VITALS — BP 109/69 | Wt 134.8 lb

## 2013-05-30 DIAGNOSIS — Z3480 Encounter for supervision of other normal pregnancy, unspecified trimester: Secondary | ICD-10-CM

## 2013-05-30 NOTE — Progress Notes (Signed)
P = [phone] 

## 2013-05-30 NOTE — Patient Instructions (Signed)
Second Trimester of Pregnancy The second trimester is from week 13 through week 28, months 4 through 6. The second trimester is often a time when you feel your best. Your body has also adjusted to being pregnant, and you begin to feel better physically. Usually, morning sickness has lessened or quit completely, you may have more energy, and you may have an increase in appetite. The second trimester is also a time when the fetus is growing rapidly. At the end of the sixth month, the fetus is about 9 inches long and weighs about 1 pounds. You will likely begin to feel the baby move (quickening) between 18 and 20 weeks of the pregnancy. BODY CHANGES Your body goes through many changes during pregnancy. The changes vary from woman to woman.   Your weight will continue to increase. You will notice your lower abdomen bulging out.  You may begin to get stretch marks on your hips, abdomen, and breasts.  You may develop headaches that can be relieved by medicines approved by your caregiver.  You may urinate more often because the fetus is pressing on your bladder.  You may develop or continue to have heartburn as a result of your pregnancy.  You may develop constipation because certain hormones are causing the muscles that push waste through your intestines to slow down.  You may develop hemorrhoids or swollen, bulging veins (varicose veins).  You may have back pain because of the weight gain and pregnancy hormones relaxing your joints between the bones in your pelvis and as a result of a shift in weight and the muscles that support your balance.  Your breasts will continue to grow and be tender.  Your gums may bleed and may be sensitive to brushing and flossing.  Dark spots or blotches (chloasma, mask of pregnancy) may develop on your face. This will likely fade after the baby is born.  A dark line from your belly button to the pubic area (linea nigra) may appear. This will likely fade after  the baby is born. WHAT TO EXPECT AT YOUR PRENATAL VISITS During a routine prenatal visit:  You will be weighed to make sure you and the fetus are growing normally.  Your blood pressure will be taken.  Your abdomen will be measured to track your baby's growth.  The fetal heartbeat will be listened to.  Any test results from the previous visit will be discussed. Your caregiver may ask you:  How you are feeling.  If you are feeling the baby move.  If you have had any abnormal symptoms, such as leaking fluid, bleeding, severe headaches, or abdominal cramping.  If you have any questions. Other tests that may be performed during your second trimester include:  Blood tests that check for:  Low iron levels (anemia).  Gestational diabetes (between 24 and 28 weeks).  Rh antibodies.  Urine tests to check for infections, diabetes, or protein in the urine.  An ultrasound to confirm the proper growth and development of the baby.  An amniocentesis to check for possible genetic problems.  Fetal screens for spina bifida and Down syndrome. HOME CARE INSTRUCTIONS   Avoid all smoking, herbs, alcohol, and unprescribed drugs. These chemicals affect the formation and growth of the baby.  Follow your caregiver's instructions regarding medicine use. There are medicines that are either safe or unsafe to take during pregnancy.  Exercise only as directed by your caregiver. Experiencing uterine cramps is a good sign to stop exercising.  Continue to eat regular,  healthy meals.  Wear a good support bra for breast tenderness.  Do not use hot tubs, steam rooms, or saunas.  Wear your seat belt at all times when driving.  Avoid raw meat, uncooked cheese, cat litter boxes, and soil used by cats. These carry germs that can cause birth defects in the baby.  Take your prenatal vitamins.  Try taking a stool softener (if your caregiver approves) if you develop constipation. Eat more high-fiber  foods, such as fresh vegetables or fruit and whole grains. Drink plenty of fluids to keep your urine clear or pale yellow.  Take warm sitz baths to soothe any pain or discomfort caused by hemorrhoids. Use hemorrhoid cream if your caregiver approves.  If you develop varicose veins, wear support hose. Elevate your feet for 15 minutes, 3 4 times a day. Limit salt in your diet.  Avoid heavy lifting, wear low heel shoes, and practice good posture.  Rest with your legs elevated if you have leg cramps or low back pain.  Visit your dentist if you have not gone yet during your pregnancy. Use a soft toothbrush to brush your teeth and be gentle when you floss.  A sexual relationship may be continued unless your caregiver directs you otherwise.  Continue to go to all your prenatal visits as directed by your caregiver. SEEK MEDICAL CARE IF:   You have dizziness.  You have mild pelvic cramps, pelvic pressure, or nagging pain in the abdominal area.  You have persistent nausea, vomiting, or diarrhea.  You have a bad smelling vaginal discharge.  You have pain with urination. SEEK IMMEDIATE MEDICAL CARE IF:   You have a fever.  You are leaking fluid from your vagina.  You have spotting or bleeding from your vagina.  You have severe abdominal cramping or pain.  You have rapid weight gain or loss.  You have shortness of breath with chest pain.  You notice sudden or extreme swelling of your face, hands, ankles, feet, or legs.  You have not felt your baby move in over an hour.  You have severe headaches that do not go away with medicine.  You have vision changes. Document Released: 02/03/2001 Document Revised: 10/12/2012 Document Reviewed: 04/12/2012 [facility] Patient Information 2014 [place].  Breastfeeding Deciding to breastfeed is one of the best choices you can make for you and your baby. A change in hormones during pregnancy causes your breast tissue to grow and increases the  number and size of your milk ducts. These hormones also allow proteins, sugars, and fats from your blood supply to make breast milk in your milk-producing glands. Hormones prevent breast milk from being released before your baby is born as well as prompt milk flow after birth. Once breastfeeding has begun, thoughts of your baby, as well as his or her sucking or crying, can stimulate the release of milk from your milk-producing glands.  BENEFITS OF BREASTFEEDING For Your Baby  Your first milk (colostrum) helps your baby's digestive system function better.   There are antibodies in your milk that help your baby fight off infections.   Your baby has a lower incidence of asthma, allergies, and sudden infant death syndrome.   The nutrients in breast milk are better for your baby than infant formulas and are designed uniquely for your baby's needs.   Breast milk improves your baby's brain development.   Your baby is less likely to develop other conditions, such as childhood obesity, asthma, or type 2 diabetes mellitus.  For  You   Breastfeeding helps to create a very special bond between you and your baby.   Breastfeeding is convenient. Breast milk is always available at the correct temperature and costs nothing.   Breastfeeding helps to burn calories and helps you lose the weight gained during pregnancy.   Breastfeeding makes your uterus contract to its prepregnancy size faster and slows bleeding (lochia) after you give birth.   Breastfeeding helps to lower your risk of developing type 2 diabetes mellitus, osteoporosis, and breast or ovarian cancer later in life. SIGNS THAT YOUR BABY IS HUNGRY Early Signs of Hunger  Increased alertness or activity.  Stretching.  Movement of the head from side to side.  Movement of the head and opening of the mouth when the corner of the mouth or cheek is stroked (rooting).  Increased sucking sounds, smacking lips, cooing, sighing, or  squeaking.  Hand-to-mouth movements.  Increased sucking of fingers or hands. Late Signs of Hunger  Fussing.  Intermittent crying. Extreme Signs of Hunger Signs of extreme hunger will require calming and consoling before your baby will be able to breastfeed successfully. Do not wait for the following signs of extreme hunger to occur before you initiate breastfeeding:   Restlessness.  A loud, strong cry.   Screaming. BREASTFEEDING BASICS Breastfeeding Initiation  Find a comfortable place to sit or lie down, with your neck and back well supported.  Place a pillow or rolled up blanket under your baby to bring him or her to the level of your breast (if you are seated). Nursing pillows are specially designed to help support your arms and your baby while you breastfeed.  Make sure that your baby's abdomen is facing your abdomen.   Gently massage your breast. With your fingertips, massage from your chest wall toward your nipple in a circular motion. This encourages milk flow. You may need to continue this action during the feeding if your milk flows slowly.  Support your breast with 4 fingers underneath and your thumb above your nipple. Make sure your fingers are well away from your nipple and your baby's mouth.   Stroke your baby's lips gently with your finger or nipple.   When your baby's mouth is open wide enough, quickly bring your baby to your breast, placing your entire nipple and as much of the colored area around your nipple (areola) as possible into your baby's mouth.   More areola should be visible above your baby's upper lip than below the lower lip.   Your baby's tongue should be between his or her lower gum and your breast.   Ensure that your baby's mouth is correctly positioned around your nipple (latched). Your baby's lips should create a seal on your breast and be turned out (everted).  It is common for your baby to suck about 2 3 minutes in order to start the  flow of breast milk. Latching Teaching your baby how to latch on to your breast properly is very important. An improper latch can cause nipple pain and decreased milk supply for you and poor weight gain in your baby. Also, if your baby is not latched onto your nipple properly, he or she may swallow some air during feeding. This can make your baby fussy. Burping your baby when you switch breasts during the feeding can help to get rid of the air. However, teaching your baby to latch on properly is still the best way to prevent fussiness from swallowing air while breastfeeding. Signs that your baby has  successfully latched on to your nipple:    Silent tugging or silent sucking, without causing you pain.   Swallowing heard between every 3 4 sucks.    Muscle movement above and in front of his or her ears while sucking.  Signs that your baby has not successfully latched on to nipple:   Sucking sounds or smacking sounds from your baby while breastfeeding.  Nipple pain. If you think your baby has not latched on correctly, slip your finger into the corner of your baby's mouth to break the suction and place it between your baby's gums. Attempt breastfeeding initiation again. Signs of Successful Breastfeeding Signs from your baby:   A gradual decrease in the number of sucks or complete cessation of sucking.   Falling asleep.   Relaxation of his or her body.   Retention of a small amount of milk in his or her mouth.   Letting go of your breast by himself or herself. Signs from you:  Breasts that have increased in firmness, weight, and size 1 3 hours after feeding.   Breasts that are softer immediately after breastfeeding.  Increased milk volume, as well as a change in milk consistency and color by the 5th day of breastfeeding.   Nipples that are not sore, cracked, or bleeding. Signs That Your [employer] is Getting Enough Milk  Wetting at least 3 diapers in a 24-hour period. The urine  should be clear and pale yellow by age 64 days.  At least 3 stools in a 24-hour period by age 64 days. The stool should be soft and yellow.  At least 3 stools in a 24-hour period by age 616 days. The stool should be seedy and yellow.  No loss of weight greater than 10% of birth weight during the first 91 days of age.  Average weight gain of 4 7 ounces (120 210 mL) per week after age 61 days.  Consistent daily weight gain by age 65 days, without weight loss after the age of 2 weeks. After a feeding, your baby may spit up a small amount. This is common. BREASTFEEDING FREQUENCY AND DURATION Frequent feeding will help you make more milk and can prevent sore nipples and breast engorgement. Breastfeed when you feel the need to reduce the fullness of your breasts or when your baby shows signs of hunger. This is called "breastfeeding on demand." Avoid introducing a pacifier to your baby while you are working to establish breastfeeding (the first 4 6 weeks after your baby is born). After this time you may choose to use a pacifier. Research has shown that pacifier use during the first year of a baby's life decreases the risk of sudden infant death syndrome (SIDS). Allow your baby to feed on each breast as long as he or she wants. Breastfeed until your baby is finished feeding. When your baby unlatches or falls asleep while feeding from the first breast, offer the second breast. Because newborns are often sleepy in the first few weeks of life, you may need to awaken your baby to get him or her to feed. Breastfeeding times will vary from baby to baby. However, the following rules can serve as a guide to help you ensure that your baby is properly fed:  Newborns (babies 53 weeks of age or younger) may breastfeed every 1 3 hours.  Newborns should not go longer than 3 hours during the day or 5 hours during the night without breastfeeding.  You should breastfeed your baby a minimum of  8 times in a 24-hour period until  you begin to introduce solid foods to your baby at around 55 months of age. BREAST MILK PUMPING Pumping and storing breast milk allows you to ensure that your baby is exclusively fed your breast milk, even at times when you are unable to breastfeed. This is especially important if you are going back to work while you are still breastfeeding or when you are not able to be present during feedings. Your lactation consultant can give you guidelines on how long it is safe to store breast milk.  A breast pump is a machine that allows you to pump milk from your breast into a sterile bottle. The pumped breast milk can then be stored in a refrigerator or freezer. Some breast pumps are operated by hand, while others use electricity. Ask your lactation consultant which type will work best for you. Breast pumps can be purchased, but some hospitals and breastfeeding support groups lease breast pumps on a monthly basis. A lactation consultant can teach you how to hand express breast milk, if you prefer not to use a pump.  CARING FOR YOUR BREASTS WHILE YOU BREASTFEED Nipples can become dry, cracked, and sore while breastfeeding. The following recommendations can help keep your breasts moisturized and healthy:  Avoid using soap on your nipples.   Wear a supportive bra. Although not required, special nursing bras and tank tops are designed to allow access to your breasts for breastfeeding without taking off your entire bra or top. Avoid wearing underwire style bras or extremely tight bras.  Air dry your nipples for 3 4minutes after each feeding.   Use only cotton bra pads to absorb leaked breast milk. Leaking of breast milk between feedings is normal.   Use lanolin on your nipples after breastfeeding. Lanolin helps to maintain your skin's normal moisture barrier. If you use pure lanolin you do not need to wash it off before feeding your baby again. Pure lanolin is not toxic to your baby. You may also hand express a  few drops of breast milk and gently massage that milk into your nipples and allow the milk to air dry. In the first few weeks after giving birth, some women experience extremely full breasts (engorgement). Engorgement can make your breasts feel heavy, warm, and tender to the touch. Engorgement peaks within 3 5 days after you give birth. The following recommendations can help ease engorgement:  Completely empty your breasts while breastfeeding or pumping. You may want to start by applying warm, moist heat (in the shower or with warm water-soaked hand towels) just before feeding or pumping. This increases circulation and helps the milk flow. If your baby does not completely empty your breasts while breastfeeding, pump any extra milk after he or she is finished.  Wear a snug bra (nursing or regular) or tank top for 1 2 days to signal your body to slightly decrease milk production.  Apply ice packs to your breasts, unless this is too uncomfortable for you.  Make sure that your baby is latched on and positioned properly while breastfeeding. If engorgement persists after 48 hours of following these recommendations, contact your health care provider or a Science writer. OVERALL HEALTH CARE RECOMMENDATIONS WHILE BREASTFEEDING  Eat healthy foods. Alternate between meals and snacks, eating 3 of each per day. Because what you eat affects your breast milk, some of the foods may make your baby more irritable than usual. Avoid eating these foods if you are sure that they are  negatively affecting your baby.  Drink milk, fruit juice, and water to satisfy your thirst (about 10 glasses a day).   Rest often, relax, and continue to take your prenatal vitamins to prevent fatigue, stress, and anemia.  Continue breast self-awareness checks.  Avoid chewing and smoking tobacco.  Avoid alcohol and drug use. Some medicines that may be harmful to your baby can pass through breast milk. It is important to ask your  health care provider before taking any medicine, including all over-the-counter and prescription medicine as well as vitamin and herbal supplements. It is possible to become pregnant while breastfeeding. If birth control is desired, ask your health care provider about options that will be safe for your baby. SEEK MEDICAL CARE IF:   You feel like you want to stop breastfeeding or have become frustrated with breastfeeding.  You have painful breasts or nipples.  Your nipples are cracked or bleeding.  Your breasts are red, tender, or warm.  You have a swollen area on either breast.  You have a fever or chills.  You have nausea or vomiting.  You have drainage other than breast milk from your nipples.  Your breasts do not become full before feedings by the 5th day after you give birth.  You feel sad and depressed.  Your baby is too sleepy to eat well.  Your baby is having trouble sleeping.   Your baby is wetting less than 3 diapers in a 24-hour period.  Your baby has less than 3 stools in a 24-hour period.  Your baby's skin or the white part of his or her eyes becomes yellow.   Your baby is not gaining weight by 5 days of age. SEEK IMMEDIATE MEDICAL CARE IF:   Your baby is overly tired (lethargic) and does not want to wake up and feed.  Your baby develops an unexplained fever. Document Released: 02/09/2005 Document Revised: 10/12/2012 Document Reviewed: 08/03/2012 [employer] Patient Information 2014 [employer].  

## 2013-05-30 NOTE — Progress Notes (Signed)
Doing well. No complaints.

## 2013-06-27 ENCOUNTER — Ambulatory Visit (INDEPENDENT_AMBULATORY_CARE_PROVIDER_SITE_OTHER): Payer: 59 | Admitting: Family Medicine

## 2013-06-27 VITALS — BP 112/82 | HR 71 | Wt 137.0 lb

## 2013-06-27 DIAGNOSIS — C4491 Basal cell carcinoma of skin, unspecified: Secondary | ICD-10-CM

## 2013-06-27 DIAGNOSIS — Z85828 Personal history of other malignant neoplasm of skin: Secondary | ICD-10-CM | POA: Insufficient documentation

## 2013-06-27 DIAGNOSIS — Z3480 Encounter for supervision of other normal pregnancy, unspecified trimester: Secondary | ICD-10-CM

## 2013-06-27 DIAGNOSIS — Z3490 Encounter for supervision of normal pregnancy, unspecified, unspecified trimester: Secondary | ICD-10-CM

## 2013-06-27 NOTE — Progress Notes (Signed)
Had skin cancer removed from her nose Schedule anatomy US

## 2013-06-27 NOTE — Patient Instructions (Signed)
Second Trimester of Pregnancy The second trimester is from week 13 through week 28, months 4 through 6. The second trimester is often a time when you feel your best. Your body has also adjusted to being pregnant, and you begin to feel better physically. Usually, morning sickness has lessened or quit completely, you may have more energy, and you may have an increase in appetite. The second trimester is also a time when the fetus is growing rapidly. At the end of the sixth month, the fetus is about 9 inches long and weighs about 1 pounds. You will likely begin to feel the baby move (quickening) between 18 and 20 weeks of the pregnancy. BODY CHANGES Your body goes through many changes during pregnancy. The changes vary from woman to woman.   Your weight will continue to increase. You will notice your lower abdomen bulging out.  You may begin to get stretch marks on your hips, abdomen, and breasts.  You may develop headaches that can be relieved by medicines approved by your caregiver.  You may urinate more often because the fetus is pressing on your bladder.  You may develop or continue to have heartburn as a result of your pregnancy.  You may develop constipation because certain hormones are causing the muscles that push waste through your intestines to slow down.  You may develop hemorrhoids or swollen, bulging veins (varicose veins).  You may have back pain because of the weight gain and pregnancy hormones relaxing your joints between the bones in your pelvis and as a result of a shift in weight and the muscles that support your balance.  Your breasts will continue to grow and be tender.  Your gums may bleed and may be sensitive to brushing and flossing.  Dark spots or blotches (chloasma, mask of pregnancy) may develop on your face. This will likely fade after the baby is born.  A dark line from your belly button to the pubic area (linea nigra) may appear. This will likely fade after  the baby is born. WHAT TO EXPECT AT YOUR PRENATAL VISITS During a routine prenatal visit:  You will be weighed to make sure you and the fetus are growing normally.  Your blood pressure will be taken.  Your abdomen will be measured to track your baby's growth.  The fetal heartbeat will be listened to.  Any test results from the previous visit will be discussed. Your caregiver may ask you:  How you are feeling.  If you are feeling the baby move.  If you have had any abnormal symptoms, such as leaking fluid, bleeding, severe headaches, or abdominal cramping.  If you have any questions. Other tests that may be performed during your second trimester include:  Blood tests that check for:  Low iron levels (anemia).  Gestational diabetes (between 24 and 28 weeks).  Rh antibodies.  Urine tests to check for infections, diabetes, or protein in the urine.  An ultrasound to confirm the proper growth and development of the baby.  An amniocentesis to check for possible genetic problems.  Fetal screens for spina bifida and Down syndrome. HOME CARE INSTRUCTIONS   Avoid all smoking, herbs, alcohol, and unprescribed drugs. These chemicals affect the formation and growth of the baby.  Follow your caregiver's instructions regarding medicine use. There are medicines that are either safe or unsafe to take during pregnancy.  Exercise only as directed by your caregiver. Experiencing uterine cramps is a good sign to stop exercising.  Continue to eat regular,  healthy meals.  Wear a good support bra for breast tenderness.  Do not use hot tubs, steam rooms, or saunas.  Wear your seat belt at all times when driving.  Avoid raw meat, uncooked cheese, cat litter boxes, and soil used by cats. These carry germs that can cause birth defects in the baby.  Take your prenatal vitamins.  Try taking a stool softener (if your caregiver approves) if you develop constipation. Eat more high-fiber  foods, such as fresh vegetables or fruit and whole grains. Drink plenty of fluids to keep your urine clear or pale yellow.  Take warm sitz baths to soothe any pain or discomfort caused by hemorrhoids. Use hemorrhoid cream if your caregiver approves.  If you develop varicose veins, wear support hose. Elevate your feet for 15 minutes, 3 4 times a day. Limit salt in your diet.  Avoid heavy lifting, wear low heel shoes, and practice good posture.  Rest with your legs elevated if you have leg cramps or low back pain.  Visit your dentist if you have not gone yet during your pregnancy. Use a soft toothbrush to brush your teeth and be gentle when you floss.  A sexual relationship may be continued unless your caregiver directs you otherwise.  Continue to go to all your prenatal visits as directed by your caregiver. SEEK MEDICAL CARE IF:   You have dizziness.  You have mild pelvic cramps, pelvic pressure, or nagging pain in the abdominal area.  You have persistent nausea, vomiting, or diarrhea.  You have a bad smelling vaginal discharge.  You have pain with urination. SEEK IMMEDIATE MEDICAL CARE IF:   You have a fever.  You are leaking fluid from your vagina.  You have spotting or bleeding from your vagina.  You have severe abdominal cramping or pain.  You have rapid weight gain or loss.  You have shortness of breath with chest pain.  You notice sudden or extreme swelling of your face, hands, ankles, feet, or legs.  You have not felt your baby move in over an hour.  You have severe headaches that do not go away with medicine.  You have vision changes. Document Released: 02/03/2001 Document Revised: 10/12/2012 Document Reviewed: 04/12/2012 [facility] Patient Information 2014 [place].  Breastfeeding Deciding to breastfeed is one of the best choices you can make for you and your baby. A change in hormones during pregnancy causes your breast tissue to grow and increases the  number and size of your milk ducts. These hormones also allow proteins, sugars, and fats from your blood supply to make breast milk in your milk-producing glands. Hormones prevent breast milk from being released before your baby is born as well as prompt milk flow after birth. Once breastfeeding has begun, thoughts of your baby, as well as his or her sucking or crying, can stimulate the release of milk from your milk-producing glands.  BENEFITS OF BREASTFEEDING For Your Baby  Your first milk (colostrum) helps your baby's digestive system function better.   There are antibodies in your milk that help your baby fight off infections.   Your baby has a lower incidence of asthma, allergies, and sudden infant death syndrome.   The nutrients in breast milk are better for your baby than infant formulas and are designed uniquely for your baby's needs.   Breast milk improves your baby's brain development.   Your baby is less likely to develop other conditions, such as childhood obesity, asthma, or type 2 diabetes mellitus.  For  You   Breastfeeding helps to create a very special bond between you and your baby.   Breastfeeding is convenient. Breast milk is always available at the correct temperature and costs nothing.   Breastfeeding helps to burn calories and helps you lose the weight gained during pregnancy.   Breastfeeding makes your uterus contract to its prepregnancy size faster and slows bleeding (lochia) after you give birth.   Breastfeeding helps to lower your risk of developing type 2 diabetes mellitus, osteoporosis, and breast or ovarian cancer later in life. SIGNS THAT YOUR BABY IS HUNGRY Early Signs of Hunger  Increased alertness or activity.  Stretching.  Movement of the head from side to side.  Movement of the head and opening of the mouth when the corner of the mouth or cheek is stroked (rooting).  Increased sucking sounds, smacking lips, cooing, sighing, or  squeaking.  Hand-to-mouth movements.  Increased sucking of fingers or hands. Late Signs of Hunger  Fussing.  Intermittent crying. Extreme Signs of Hunger Signs of extreme hunger will require calming and consoling before your baby will be able to breastfeed successfully. Do not wait for the following signs of extreme hunger to occur before you initiate breastfeeding:   Restlessness.  A loud, strong cry.   Screaming. BREASTFEEDING BASICS Breastfeeding Initiation  Find a comfortable place to sit or lie down, with your neck and back well supported.  Place a pillow or rolled up blanket under your baby to bring him or her to the level of your breast (if you are seated). Nursing pillows are specially designed to help support your arms and your baby while you breastfeed.  Make sure that your baby's abdomen is facing your abdomen.   Gently massage your breast. With your fingertips, massage from your chest wall toward your nipple in a circular motion. This encourages milk flow. You may need to continue this action during the feeding if your milk flows slowly.  Support your breast with 4 fingers underneath and your thumb above your nipple. Make sure your fingers are well away from your nipple and your baby's mouth.   Stroke your baby's lips gently with your finger or nipple.   When your baby's mouth is open wide enough, quickly bring your baby to your breast, placing your entire nipple and as much of the colored area around your nipple (areola) as possible into your baby's mouth.   More areola should be visible above your baby's upper lip than below the lower lip.   Your baby's tongue should be between his or her lower gum and your breast.   Ensure that your baby's mouth is correctly positioned around your nipple (latched). Your baby's lips should create a seal on your breast and be turned out (everted).  It is common for your baby to suck about 2 3 minutes in order to start the  flow of breast milk. Latching Teaching your baby how to latch on to your breast properly is very important. An improper latch can cause nipple pain and decreased milk supply for you and poor weight gain in your baby. Also, if your baby is not latched onto your nipple properly, he or she may swallow some air during feeding. This can make your baby fussy. Burping your baby when you switch breasts during the feeding can help to get rid of the air. However, teaching your baby to latch on properly is still the best way to prevent fussiness from swallowing air while breastfeeding. Signs that your baby has  successfully latched on to your nipple:    Silent tugging or silent sucking, without causing you pain.   Swallowing heard between every 3 4 sucks.    Muscle movement above and in front of his or her ears while sucking.  Signs that your baby has not successfully latched on to nipple:   Sucking sounds or smacking sounds from your baby while breastfeeding.  Nipple pain. If you think your baby has not latched on correctly, slip your finger into the corner of your baby's mouth to break the suction and place it between your baby's gums. Attempt breastfeeding initiation again. Signs of Successful Breastfeeding Signs from your baby:   A gradual decrease in the number of sucks or complete cessation of sucking.   Falling asleep.   Relaxation of his or her body.   Retention of a small amount of milk in his or her mouth.   Letting go of your breast by himself or herself. Signs from you:  Breasts that have increased in firmness, weight, and size 1 3 hours after feeding.   Breasts that are softer immediately after breastfeeding.  Increased milk volume, as well as a change in milk consistency and color by the 5th day of breastfeeding.   Nipples that are not sore, cracked, or bleeding. Signs That Your [employer] is Getting Enough Milk  Wetting at least 3 diapers in a 24-hour period. The urine  should be clear and pale yellow by age 64 days.  At least 3 stools in a 24-hour period by age 64 days. The stool should be soft and yellow.  At least 3 stools in a 24-hour period by age 616 days. The stool should be seedy and yellow.  No loss of weight greater than 10% of birth weight during the first 91 days of age.  Average weight gain of 4 7 ounces (120 210 mL) per week after age 61 days.  Consistent daily weight gain by age 65 days, without weight loss after the age of 2 weeks. After a feeding, your baby may spit up a small amount. This is common. BREASTFEEDING FREQUENCY AND DURATION Frequent feeding will help you make more milk and can prevent sore nipples and breast engorgement. Breastfeed when you feel the need to reduce the fullness of your breasts or when your baby shows signs of hunger. This is called "breastfeeding on demand." Avoid introducing a pacifier to your baby while you are working to establish breastfeeding (the first 4 6 weeks after your baby is born). After this time you may choose to use a pacifier. Research has shown that pacifier use during the first year of a baby's life decreases the risk of sudden infant death syndrome (SIDS). Allow your baby to feed on each breast as long as he or she wants. Breastfeed until your baby is finished feeding. When your baby unlatches or falls asleep while feeding from the first breast, offer the second breast. Because newborns are often sleepy in the first few weeks of life, you may need to awaken your baby to get him or her to feed. Breastfeeding times will vary from baby to baby. However, the following rules can serve as a guide to help you ensure that your baby is properly fed:  Newborns (babies 53 weeks of age or younger) may breastfeed every 1 3 hours.  Newborns should not go longer than 3 hours during the day or 5 hours during the night without breastfeeding.  You should breastfeed your baby a minimum of  8 times in a 24-hour period until  you begin to introduce solid foods to your baby at around 55 months of age. BREAST MILK PUMPING Pumping and storing breast milk allows you to ensure that your baby is exclusively fed your breast milk, even at times when you are unable to breastfeed. This is especially important if you are going back to work while you are still breastfeeding or when you are not able to be present during feedings. Your lactation consultant can give you guidelines on how long it is safe to store breast milk.  A breast pump is a machine that allows you to pump milk from your breast into a sterile bottle. The pumped breast milk can then be stored in a refrigerator or freezer. Some breast pumps are operated by hand, while others use electricity. Ask your lactation consultant which type will work best for you. Breast pumps can be purchased, but some hospitals and breastfeeding support groups lease breast pumps on a monthly basis. A lactation consultant can teach you how to hand express breast milk, if you prefer not to use a pump.  CARING FOR YOUR BREASTS WHILE YOU BREASTFEED Nipples can become dry, cracked, and sore while breastfeeding. The following recommendations can help keep your breasts moisturized and healthy:  Avoid using soap on your nipples.   Wear a supportive bra. Although not required, special nursing bras and tank tops are designed to allow access to your breasts for breastfeeding without taking off your entire bra or top. Avoid wearing underwire style bras or extremely tight bras.  Air dry your nipples for 3 4minutes after each feeding.   Use only cotton bra pads to absorb leaked breast milk. Leaking of breast milk between feedings is normal.   Use lanolin on your nipples after breastfeeding. Lanolin helps to maintain your skin's normal moisture barrier. If you use pure lanolin you do not need to wash it off before feeding your baby again. Pure lanolin is not toxic to your baby. You may also hand express a  few drops of breast milk and gently massage that milk into your nipples and allow the milk to air dry. In the first few weeks after giving birth, some women experience extremely full breasts (engorgement). Engorgement can make your breasts feel heavy, warm, and tender to the touch. Engorgement peaks within 3 5 days after you give birth. The following recommendations can help ease engorgement:  Completely empty your breasts while breastfeeding or pumping. You may want to start by applying warm, moist heat (in the shower or with warm water-soaked hand towels) just before feeding or pumping. This increases circulation and helps the milk flow. If your baby does not completely empty your breasts while breastfeeding, pump any extra milk after he or she is finished.  Wear a snug bra (nursing or regular) or tank top for 1 2 days to signal your body to slightly decrease milk production.  Apply ice packs to your breasts, unless this is too uncomfortable for you.  Make sure that your baby is latched on and positioned properly while breastfeeding. If engorgement persists after 48 hours of following these recommendations, contact your health care provider or a Science writer. OVERALL HEALTH CARE RECOMMENDATIONS WHILE BREASTFEEDING  Eat healthy foods. Alternate between meals and snacks, eating 3 of each per day. Because what you eat affects your breast milk, some of the foods may make your baby more irritable than usual. Avoid eating these foods if you are sure that they are  negatively affecting your baby.  Drink milk, fruit juice, and water to satisfy your thirst (about 10 glasses a day).   Rest often, relax, and continue to take your prenatal vitamins to prevent fatigue, stress, and anemia.  Continue breast self-awareness checks.  Avoid chewing and smoking tobacco.  Avoid alcohol and drug use. Some medicines that may be harmful to your baby can pass through breast milk. It is important to ask your  health care provider before taking any medicine, including all over-the-counter and prescription medicine as well as vitamin and herbal supplements. It is possible to become pregnant while breastfeeding. If birth control is desired, ask your health care provider about options that will be safe for your baby. SEEK MEDICAL CARE IF:   You feel like you want to stop breastfeeding or have become frustrated with breastfeeding.  You have painful breasts or nipples.  Your nipples are cracked or bleeding.  Your breasts are red, tender, or warm.  You have a swollen area on either breast.  You have a fever or chills.  You have nausea or vomiting.  You have drainage other than breast milk from your nipples.  Your breasts do not become full before feedings by the 5th day after you give birth.  You feel sad and depressed.  Your baby is too sleepy to eat well.  Your baby is having trouble sleeping.   Your baby is wetting less than 3 diapers in a 24-hour period.  Your baby has less than 3 stools in a 24-hour period.  Your baby's skin or the white part of his or her eyes becomes yellow.   Your baby is not gaining weight by 5 days of age. SEEK IMMEDIATE MEDICAL CARE IF:   Your baby is overly tired (lethargic) and does not want to wake up and feed.  Your baby develops an unexplained fever. Document Released: 02/09/2005 Document Revised: 10/12/2012 Document Reviewed: 08/03/2012 [employer] Patient Information 2014 [employer].  

## 2013-07-18 ENCOUNTER — Ambulatory Visit (HOSPITAL_COMMUNITY)
Admission: RE | Admit: 2013-07-18 | Discharge: 2013-07-18 | Disposition: A | Discharge status: Home / Self Care | Payer: 59 | Source: Ambulatory Visit | Attending: Family Medicine | Admitting: Family Medicine

## 2013-07-18 ENCOUNTER — Other Ambulatory Visit: Payer: Self-pay | Admitting: Family Medicine

## 2013-07-18 ENCOUNTER — Encounter: Payer: Self-pay | Admitting: Family Medicine

## 2013-07-18 DIAGNOSIS — Z3689 Encounter for other specified antenatal screening: Secondary | ICD-10-CM | POA: Insufficient documentation

## 2013-07-18 DIAGNOSIS — Z3490 Encounter for supervision of normal pregnancy, unspecified, unspecified trimester: Secondary | ICD-10-CM

## 2013-07-24 ENCOUNTER — Ambulatory Visit (INDEPENDENT_AMBULATORY_CARE_PROVIDER_SITE_OTHER): Payer: 59 | Admitting: Family Medicine

## 2013-07-24 ENCOUNTER — Encounter: Payer: Self-pay | Admitting: Family Medicine

## 2013-07-24 VITALS — BP 103/68 | HR 77 | Wt 145.6 lb

## 2013-07-24 DIAGNOSIS — Z3480 Encounter for supervision of other normal pregnancy, unspecified trimester: Secondary | ICD-10-CM

## 2013-07-24 DIAGNOSIS — Z3490 Encounter for supervision of normal pregnancy, unspecified, unspecified trimester: Secondary | ICD-10-CM

## 2013-07-24 NOTE — Progress Notes (Signed)
Anatomy reveiwed--needs f/u of face and spine. Will schedule.

## 2013-07-24 NOTE — Patient Instructions (Signed)
Second Trimester of Pregnancy The second trimester is from week 13 through week 28, months 4 through 6. The second trimester is often a time when you feel your best. Your body has also adjusted to being pregnant, and you begin to feel better physically. Usually, morning sickness has lessened or quit completely, you may have more energy, and you may have an increase in appetite. The second trimester is also a time when the fetus is growing rapidly. At the end of the sixth month, the fetus is about 9 inches long and weighs about 1 pounds. You will likely begin to feel the baby move (quickening) between 18 and 20 weeks of the pregnancy. BODY CHANGES Your body goes through many changes during pregnancy. The changes vary from woman to woman.   Your weight will continue to increase. You will notice your lower abdomen bulging out.  You may begin to get stretch marks on your hips, abdomen, and breasts.  You may develop headaches that can be relieved by medicines approved by your caregiver.  You may urinate more often because the fetus is pressing on your bladder.  You may develop or continue to have heartburn as a result of your pregnancy.  You may develop constipation because certain hormones are causing the muscles that push waste through your intestines to slow down.  You may develop hemorrhoids or swollen, bulging veins (varicose veins).  You may have back pain because of the weight gain and pregnancy hormones relaxing your joints between the bones in your pelvis and as a result of a shift in weight and the muscles that support your balance.  Your breasts will continue to grow and be tender.  Your gums may bleed and may be sensitive to brushing and flossing.  Dark spots or blotches (chloasma, mask of pregnancy) may develop on your face. This will likely fade after the baby is born.  A dark line from your belly button to the pubic area (linea nigra) may appear. This will likely fade after  the baby is born. WHAT TO EXPECT AT YOUR PRENATAL VISITS During a routine prenatal visit:  You will be weighed to make sure you and the fetus are growing normally.  Your blood pressure will be taken.  Your abdomen will be measured to track your baby's growth.  The fetal heartbeat will be listened to.  Any test results from the previous visit will be discussed. Your caregiver may ask you:  How you are feeling.  If you are feeling the baby move.  If you have had any abnormal symptoms, such as leaking fluid, bleeding, severe headaches, or abdominal cramping.  If you have any questions. Other tests that may be performed during your second trimester include:  Blood tests that check for:  Low iron levels (anemia).  Gestational diabetes (between 24 and 28 weeks).  Rh antibodies.  Urine tests to check for infections, diabetes, or protein in the urine.  An ultrasound to confirm the proper growth and development of the baby.  An amniocentesis to check for possible genetic problems.  Fetal screens for spina bifida and Down syndrome. HOME CARE INSTRUCTIONS   Avoid all smoking, herbs, alcohol, and unprescribed drugs. These chemicals affect the formation and growth of the baby.  Follow your caregiver's instructions regarding medicine use. There are medicines that are either safe or unsafe to take during pregnancy.  Exercise only as directed by your caregiver. Experiencing uterine cramps is a good sign to stop exercising.  Continue to eat regular,  healthy meals.  Wear a good support bra for breast tenderness.  Do not use hot tubs, steam rooms, or saunas.  Wear your seat belt at all times when driving.  Avoid raw meat, uncooked cheese, cat litter boxes, and soil used by cats. These carry germs that can cause birth defects in the baby.  Take your prenatal vitamins.  Try taking a stool softener (if your caregiver approves) if you develop constipation. Eat more high-fiber  foods, such as fresh vegetables or fruit and whole grains. Drink plenty of fluids to keep your urine clear or pale yellow.  Take warm sitz baths to soothe any pain or discomfort caused by hemorrhoids. Use hemorrhoid cream if your caregiver approves.  If you develop varicose veins, wear support hose. Elevate your feet for 15 minutes, 3 4 times a day. Limit salt in your diet.  Avoid heavy lifting, wear low heel shoes, and practice good posture.  Rest with your legs elevated if you have leg cramps or low back pain.  Visit your dentist if you have not gone yet during your pregnancy. Use a soft toothbrush to brush your teeth and be gentle when you floss.  A sexual relationship may be continued unless your caregiver directs you otherwise.  Continue to go to all your prenatal visits as directed by your caregiver. SEEK MEDICAL CARE IF:   You have dizziness.  You have mild pelvic cramps, pelvic pressure, or nagging pain in the abdominal area.  You have persistent nausea, vomiting, or diarrhea.  You have a bad smelling vaginal discharge.  You have pain with urination. SEEK IMMEDIATE MEDICAL CARE IF:   You have a fever.  You are leaking fluid from your vagina.  You have spotting or bleeding from your vagina.  You have severe abdominal cramping or pain.  You have rapid weight gain or loss.  You have shortness of breath with chest pain.  You notice sudden or extreme swelling of your face, hands, ankles, feet, or legs.  You have not felt your baby move in over an hour.  You have severe headaches that do not go away with medicine.  You have vision changes. Document Released: 02/03/2001 Document Revised: 10/12/2012 Document Reviewed: 04/12/2012 [facility] Patient Information 2014 [place].  Breastfeeding Deciding to breastfeed is one of the best choices you can make for you and your baby. A change in hormones during pregnancy causes your breast tissue to grow and increases the  number and size of your milk ducts. These hormones also allow proteins, sugars, and fats from your blood supply to make breast milk in your milk-producing glands. Hormones prevent breast milk from being released before your baby is born as well as prompt milk flow after birth. Once breastfeeding has begun, thoughts of your baby, as well as his or her sucking or crying, can stimulate the release of milk from your milk-producing glands.  BENEFITS OF BREASTFEEDING For Your Baby  Your first milk (colostrum) helps your baby's digestive system function better.   There are antibodies in your milk that help your baby fight off infections.   Your baby has a lower incidence of asthma, allergies, and sudden infant death syndrome.   The nutrients in breast milk are better for your baby than infant formulas and are designed uniquely for your baby's needs.   Breast milk improves your baby's brain development.   Your baby is less likely to develop other conditions, such as childhood obesity, asthma, or type 2 diabetes mellitus.  For  You   Breastfeeding helps to create a very special bond between you and your baby.   Breastfeeding is convenient. Breast milk is always available at the correct temperature and costs nothing.   Breastfeeding helps to burn calories and helps you lose the weight gained during pregnancy.   Breastfeeding makes your uterus contract to its prepregnancy size faster and slows bleeding (lochia) after you give birth.   Breastfeeding helps to lower your risk of developing type 2 diabetes mellitus, osteoporosis, and breast or ovarian cancer later in life. SIGNS THAT YOUR BABY IS HUNGRY Early Signs of Hunger  Increased alertness or activity.  Stretching.  Movement of the head from side to side.  Movement of the head and opening of the mouth when the corner of the mouth or cheek is stroked (rooting).  Increased sucking sounds, smacking lips, cooing, sighing, or  squeaking.  Hand-to-mouth movements.  Increased sucking of fingers or hands. Late Signs of Hunger  Fussing.  Intermittent crying. Extreme Signs of Hunger Signs of extreme hunger will require calming and consoling before your baby will be able to breastfeed successfully. Do not wait for the following signs of extreme hunger to occur before you initiate breastfeeding:   Restlessness.  A loud, strong cry.   Screaming. BREASTFEEDING BASICS Breastfeeding Initiation  Find a comfortable place to sit or lie down, with your neck and back well supported.  Place a pillow or rolled up blanket under your baby to bring him or her to the level of your breast (if you are seated). Nursing pillows are specially designed to help support your arms and your baby while you breastfeed.  Make sure that your baby's abdomen is facing your abdomen.   Gently massage your breast. With your fingertips, massage from your chest wall toward your nipple in a circular motion. This encourages milk flow. You may need to continue this action during the feeding if your milk flows slowly.  Support your breast with 4 fingers underneath and your thumb above your nipple. Make sure your fingers are well away from your nipple and your baby's mouth.   Stroke your baby's lips gently with your finger or nipple.   When your baby's mouth is open wide enough, quickly bring your baby to your breast, placing your entire nipple and as much of the colored area around your nipple (areola) as possible into your baby's mouth.   More areola should be visible above your baby's upper lip than below the lower lip.   Your baby's tongue should be between his or her lower gum and your breast.   Ensure that your baby's mouth is correctly positioned around your nipple (latched). Your baby's lips should create a seal on your breast and be turned out (everted).  It is common for your baby to suck about 2 3 minutes in order to start the  flow of breast milk. Latching Teaching your baby how to latch on to your breast properly is very important. An improper latch can cause nipple pain and decreased milk supply for you and poor weight gain in your baby. Also, if your baby is not latched onto your nipple properly, he or she may swallow some air during feeding. This can make your baby fussy. Burping your baby when you switch breasts during the feeding can help to get rid of the air. However, teaching your baby to latch on properly is still the best way to prevent fussiness from swallowing air while breastfeeding. Signs that your baby has  successfully latched on to your nipple:    Silent tugging or silent sucking, without causing you pain.   Swallowing heard between every 3 4 sucks.    Muscle movement above and in front of his or her ears while sucking.  Signs that your baby has not successfully latched on to nipple:   Sucking sounds or smacking sounds from your baby while breastfeeding.  Nipple pain. If you think your baby has not latched on correctly, slip your finger into the corner of your baby's mouth to break the suction and place it between your baby's gums. Attempt breastfeeding initiation again. Signs of Successful Breastfeeding Signs from your baby:   A gradual decrease in the number of sucks or complete cessation of sucking.   Falling asleep.   Relaxation of his or her body.   Retention of a small amount of milk in his or her mouth.   Letting go of your breast by himself or herself. Signs from you:  Breasts that have increased in firmness, weight, and size 1 3 hours after feeding.   Breasts that are softer immediately after breastfeeding.  Increased milk volume, as well as a change in milk consistency and color by the 5th day of breastfeeding.   Nipples that are not sore, cracked, or bleeding. Signs That Your [employer] is Getting Enough Milk  Wetting at least 3 diapers in a 24-hour period. The urine  should be clear and pale yellow by age 64 days.  At least 3 stools in a 24-hour period by age 64 days. The stool should be soft and yellow.  At least 3 stools in a 24-hour period by age 616 days. The stool should be seedy and yellow.  No loss of weight greater than 10% of birth weight during the first 91 days of age.  Average weight gain of 4 7 ounces (120 210 mL) per week after age 61 days.  Consistent daily weight gain by age 65 days, without weight loss after the age of 2 weeks. After a feeding, your baby may spit up a small amount. This is common. BREASTFEEDING FREQUENCY AND DURATION Frequent feeding will help you make more milk and can prevent sore nipples and breast engorgement. Breastfeed when you feel the need to reduce the fullness of your breasts or when your baby shows signs of hunger. This is called "breastfeeding on demand." Avoid introducing a pacifier to your baby while you are working to establish breastfeeding (the first 4 6 weeks after your baby is born). After this time you may choose to use a pacifier. Research has shown that pacifier use during the first year of a baby's life decreases the risk of sudden infant death syndrome (SIDS). Allow your baby to feed on each breast as long as he or she wants. Breastfeed until your baby is finished feeding. When your baby unlatches or falls asleep while feeding from the first breast, offer the second breast. Because newborns are often sleepy in the first few weeks of life, you may need to awaken your baby to get him or her to feed. Breastfeeding times will vary from baby to baby. However, the following rules can serve as a guide to help you ensure that your baby is properly fed:  Newborns (babies 53 weeks of age or younger) may breastfeed every 1 3 hours.  Newborns should not go longer than 3 hours during the day or 5 hours during the night without breastfeeding.  You should breastfeed your baby a minimum of  8 times in a 24-hour period until  you begin to introduce solid foods to your baby at around 55 months of age. BREAST MILK PUMPING Pumping and storing breast milk allows you to ensure that your baby is exclusively fed your breast milk, even at times when you are unable to breastfeed. This is especially important if you are going back to work while you are still breastfeeding or when you are not able to be present during feedings. Your lactation consultant can give you guidelines on how long it is safe to store breast milk.  A breast pump is a machine that allows you to pump milk from your breast into a sterile bottle. The pumped breast milk can then be stored in a refrigerator or freezer. Some breast pumps are operated by hand, while others use electricity. Ask your lactation consultant which type will work best for you. Breast pumps can be purchased, but some hospitals and breastfeeding support groups lease breast pumps on a monthly basis. A lactation consultant can teach you how to hand express breast milk, if you prefer not to use a pump.  CARING FOR YOUR BREASTS WHILE YOU BREASTFEED Nipples can become dry, cracked, and sore while breastfeeding. The following recommendations can help keep your breasts moisturized and healthy:  Avoid using soap on your nipples.   Wear a supportive bra. Although not required, special nursing bras and tank tops are designed to allow access to your breasts for breastfeeding without taking off your entire bra or top. Avoid wearing underwire style bras or extremely tight bras.  Air dry your nipples for 3 4minutes after each feeding.   Use only cotton bra pads to absorb leaked breast milk. Leaking of breast milk between feedings is normal.   Use lanolin on your nipples after breastfeeding. Lanolin helps to maintain your skin's normal moisture barrier. If you use pure lanolin you do not need to wash it off before feeding your baby again. Pure lanolin is not toxic to your baby. You may also hand express a  few drops of breast milk and gently massage that milk into your nipples and allow the milk to air dry. In the first few weeks after giving birth, some women experience extremely full breasts (engorgement). Engorgement can make your breasts feel heavy, warm, and tender to the touch. Engorgement peaks within 3 5 days after you give birth. The following recommendations can help ease engorgement:  Completely empty your breasts while breastfeeding or pumping. You may want to start by applying warm, moist heat (in the shower or with warm water-soaked hand towels) just before feeding or pumping. This increases circulation and helps the milk flow. If your baby does not completely empty your breasts while breastfeeding, pump any extra milk after he or she is finished.  Wear a snug bra (nursing or regular) or tank top for 1 2 days to signal your body to slightly decrease milk production.  Apply ice packs to your breasts, unless this is too uncomfortable for you.  Make sure that your baby is latched on and positioned properly while breastfeeding. If engorgement persists after 48 hours of following these recommendations, contact your health care provider or a Science writer. OVERALL HEALTH CARE RECOMMENDATIONS WHILE BREASTFEEDING  Eat healthy foods. Alternate between meals and snacks, eating 3 of each per day. Because what you eat affects your breast milk, some of the foods may make your baby more irritable than usual. Avoid eating these foods if you are sure that they are  negatively affecting your baby.  Drink milk, fruit juice, and water to satisfy your thirst (about 10 glasses a day).   Rest often, relax, and continue to take your prenatal vitamins to prevent fatigue, stress, and anemia.  Continue breast self-awareness checks.  Avoid chewing and smoking tobacco.  Avoid alcohol and drug use. Some medicines that may be harmful to your baby can pass through breast milk. It is important to ask your  health care provider before taking any medicine, including all over-the-counter and prescription medicine as well as vitamin and herbal supplements. It is possible to become pregnant while breastfeeding. If birth control is desired, ask your health care provider about options that will be safe for your baby. SEEK MEDICAL CARE IF:   You feel like you want to stop breastfeeding or have become frustrated with breastfeeding.  You have painful breasts or nipples.  Your nipples are cracked or bleeding.  Your breasts are red, tender, or warm.  You have a swollen area on either breast.  You have a fever or chills.  You have nausea or vomiting.  You have drainage other than breast milk from your nipples.  Your breasts do not become full before feedings by the 5th day after you give birth.  You feel sad and depressed.  Your baby is too sleepy to eat well.  Your baby is having trouble sleeping.   Your baby is wetting less than 3 diapers in a 24-hour period.  Your baby has less than 3 stools in a 24-hour period.  Your baby's skin or the white part of his or her eyes becomes yellow.   Your baby is not gaining weight by 5 days of age. SEEK IMMEDIATE MEDICAL CARE IF:   Your baby is overly tired (lethargic) and does not want to wake up and feed.  Your baby develops an unexplained fever. Document Released: 02/09/2005 Document Revised: 10/12/2012 Document Reviewed: 08/03/2012 [employer] Patient Information 2014 [employer].  

## 2013-08-02 ENCOUNTER — Ambulatory Visit (HOSPITAL_COMMUNITY): Payer: 59

## 2013-08-03 ENCOUNTER — Ambulatory Visit (HOSPITAL_COMMUNITY)
Admission: RE | Admit: 2013-08-03 | Discharge: 2013-08-03 | Disposition: A | Discharge status: Home / Self Care | Payer: 59 | Source: Ambulatory Visit | Attending: Family Medicine | Admitting: Family Medicine

## 2013-08-03 DIAGNOSIS — Z3689 Encounter for other specified antenatal screening: Secondary | ICD-10-CM | POA: Insufficient documentation

## 2013-08-03 DIAGNOSIS — Z3490 Encounter for supervision of normal pregnancy, unspecified, unspecified trimester: Secondary | ICD-10-CM

## 2013-08-04 ENCOUNTER — Encounter: Payer: Self-pay | Admitting: Family Medicine

## 2013-08-22 ENCOUNTER — Encounter: Payer: Self-pay | Admitting: Family Medicine

## 2013-08-22 ENCOUNTER — Ambulatory Visit (INDEPENDENT_AMBULATORY_CARE_PROVIDER_SITE_OTHER): Payer: 59 | Admitting: Family Medicine

## 2013-08-22 VITALS — BP 108/67 | HR 69 | Wt 147.0 lb

## 2013-08-22 DIAGNOSIS — Z3480 Encounter for supervision of other normal pregnancy, unspecified trimester: Secondary | ICD-10-CM

## 2013-08-22 DIAGNOSIS — Z3492 Encounter for supervision of normal pregnancy, unspecified, second trimester: Secondary | ICD-10-CM

## 2013-08-22 NOTE — Progress Notes (Signed)
F/u u/s was WNL. Doing well.

## 2013-08-22 NOTE — Patient Instructions (Signed)
Breastfeeding Deciding to breastfeed is one of the best choices you can make for you and your baby. A change in hormones during pregnancy causes your breast tissue to grow and increases the number and size of your milk ducts. These hormones also allow proteins, sugars, and fats from your blood supply to make breast milk in your milk-producing glands. Hormones prevent breast milk from being released before your baby is born as well as prompt milk flow after birth. Once breastfeeding has begun, thoughts of your baby, as well as his or her sucking or crying, can stimulate the release of milk from your milk-producing glands.  BENEFITS OF BREASTFEEDING For Your Baby  Your first milk (colostrum) helps your baby's digestive system function better.   There are antibodies in your milk that help your baby fight off infections.   Your baby has a lower incidence of asthma, allergies, and sudden infant death syndrome.   The nutrients in breast milk are better for your baby than infant formulas and are designed uniquely for your baby's needs.   Breast milk improves your baby's brain development.   Your baby is less likely to develop other conditions, such as childhood obesity, asthma, or type 2 diabetes mellitus.  For You   Breastfeeding helps to create a very special bond between you and your baby.   Breastfeeding is convenient. Breast milk is always available at the correct temperature and costs nothing.   Breastfeeding helps to burn calories and helps you lose the weight gained during pregnancy.   Breastfeeding makes your uterus contract to its prepregnancy size faster and slows bleeding (lochia) after you give birth.   Breastfeeding helps to lower your risk of developing type 2 diabetes mellitus, osteoporosis, and breast or ovarian cancer later in life. SIGNS THAT YOUR BABY IS HUNGRY Early Signs of Hunger  Increased alertness or activity.  Stretching.  Movement of the head from  side to side.  Movement of the head and opening of the mouth when the corner of the mouth or cheek is stroked (rooting).  Increased sucking sounds, smacking lips, cooing, sighing, or squeaking.  Hand-to-mouth movements.  Increased sucking of fingers or hands. Late Signs of Hunger  Fussing.  Intermittent crying. Extreme Signs of Hunger Signs of extreme hunger will require calming and consoling before your baby will be able to breastfeed successfully. Do not wait for the following signs of extreme hunger to occur before you initiate breastfeeding:   Restlessness.  A loud, strong cry.   Screaming. BREASTFEEDING BASICS Breastfeeding Initiation  Find a comfortable place to sit or lie down, with your neck and back well supported.  Place a pillow or rolled up blanket under your baby to bring him or her to the level of your breast (if you are seated). Nursing pillows are specially designed to help support your arms and your baby while you breastfeed.  Make sure that your baby's abdomen is facing your abdomen.   Gently massage your breast. With your fingertips, massage from your chest wall toward your nipple in a circular motion. This encourages milk flow. You may need to continue this action during the feeding if your milk flows slowly.  Support your breast with 4 fingers underneath and your thumb above your nipple. Make sure your fingers are well away from your nipple and your baby's mouth.   Stroke your baby's lips gently with your finger or nipple.   When your baby's mouth is open wide enough, quickly bring your baby to your   breast, placing your entire nipple and as much of the colored area around your nipple (areola) as possible into your baby's mouth.   More areola should be visible above your baby's upper lip than below the lower lip.   Your baby's tongue should be between his or her lower gum and your breast.   Ensure that your baby's mouth is correctly positioned  around your nipple (latched). Your baby's lips should create a seal on your breast and be turned out (everted).  It is common for your baby to suck about 2-3 minutes in order to start the flow of breast milk. Latching Teaching your baby how to latch on to your breast properly is very important. An improper latch can cause nipple pain and decreased milk supply for you and poor weight gain in your baby. Also, if your baby is not latched onto your nipple properly, he or she may swallow some air during feeding. This can make your baby fussy. Burping your baby when you switch breasts during the feeding can help to get rid of the air. However, teaching your baby to latch on properly is still the best way to prevent fussiness from swallowing air while breastfeeding. Signs that your baby has successfully latched on to your nipple:    Silent tugging or silent sucking, without causing you pain.   Swallowing heard between every 3-4 sucks.    Muscle movement above and in front of his or her ears while sucking.  Signs that your baby has not successfully latched on to nipple:   Sucking sounds or smacking sounds from your baby while breastfeeding.  Nipple pain. If you think your baby has not latched on correctly, slip your finger into the corner of your baby's mouth to break the suction and place it between your baby's gums. Attempt breastfeeding initiation again. Signs of Successful Breastfeeding Signs from your baby:   A gradual decrease in the number of sucks or complete cessation of sucking.   Falling asleep.   Relaxation of his or her body.   Retention of a small amount of milk in his or her mouth.   Letting go of your breast by himself or herself. Signs from you:  Breasts that have increased in firmness, weight, and size 1-3 hours after feeding.   Breasts that are softer immediately after breastfeeding.  Increased milk volume, as well as a change in milk consistency and color by  the fifth day of breastfeeding.   Nipples that are not sore, cracked, or bleeding. Signs That Your [facility] is Getting Enough Milk  Wetting at least 3 diapers in a 24-hour period. The urine should be clear and pale yellow by age 11 days.  At least 3 stools in a 24-hour period by age 11 days. The stool should be soft and yellow.  At least 3 stools in a 24-hour period by age 18 days. The stool should be seedy and yellow.  No loss of weight greater than 10% of birth weight during the first 55 days of age.  Average weight gain of 4-7 ounces (113-198 g) per week after age 36 days.  Consistent daily weight gain by age 9 days, without weight loss after the age of 2 weeks. After a feeding, your baby may spit up a small amount. This is common. BREASTFEEDING FREQUENCY AND DURATION Frequent feeding will help you make more milk and can prevent sore nipples and breast engorgement. Breastfeed when you feel the need to reduce the fullness of your breasts  or when your baby shows signs of hunger. This is called "breastfeeding on demand." Avoid introducing a pacifier to your baby while you are working to establish breastfeeding (the first 4-6 weeks after your baby is born). After this time you may choose to use a pacifier. Research has shown that pacifier use during the first year of a baby's life decreases the risk of sudden infant death syndrome (SIDS). Allow your baby to feed on each breast as long as he or she wants. Breastfeed until your baby is finished feeding. When your baby unlatches or falls asleep while feeding from the first breast, offer the second breast. Because newborns are often sleepy in the first few weeks of life, you may need to awaken your baby to get him or her to feed. Breastfeeding times will vary from baby to baby. However, the following rules can serve as a guide to help you ensure that your baby is properly fed:  Newborns (babies 5 weeks of age or younger) may breastfeed every 1-3  hours.  Newborns should not go longer than 3 hours during the day or 5 hours during the night without breastfeeding.  You should breastfeed your baby a minimum of 8 times in a 24-hour period until you begin to introduce solid foods to your baby at around 54 months of age. BREAST MILK PUMPING Pumping and storing breast milk allows you to ensure that your baby is exclusively fed your breast milk, even at times when you are unable to breastfeed. This is especially important if you are going back to work while you are still breastfeeding or when you are not able to be present during feedings. Your lactation consultant can give you guidelines on how long it is safe to store breast milk.  A breast pump is a machine that allows you to pump milk from your breast into a sterile bottle. The pumped breast milk can then be stored in a refrigerator or freezer. Some breast pumps are operated by hand, while others use electricity. Ask your lactation consultant which type will work best for you. Breast pumps can be purchased, but some hospitals and breastfeeding support groups lease breast pumps on a monthly basis. A lactation consultant can teach you how to hand express breast milk, if you prefer not to use a pump.  CARING FOR YOUR BREASTS WHILE YOU BREASTFEED Nipples can become dry, cracked, and sore while breastfeeding. The following recommendations can help keep your breasts moisturized and healthy:  Avoid using soap on your nipples.   Wear a supportive bra. Although not required, special nursing bras and tank tops are designed to allow access to your breasts for breastfeeding without taking off your entire bra or top. Avoid wearing underwire-style bras or extremely tight bras.  Air dry your nipples for 3-98minutes after each feeding.   Use only cotton bra pads to absorb leaked breast milk. Leaking of breast milk between feedings is normal.   Use lanolin on your nipples after breastfeeding. Lanolin helps to  maintain your skin's normal moisture barrier. If you use pure lanolin, you do not need to wash it off before feeding your baby again. Pure lanolin is not toxic to your baby. You may also hand express a few drops of breast milk and gently massage that milk into your nipples and allow the milk to air dry. In the first few weeks after giving birth, some women experience extremely full breasts (engorgement). Engorgement can make your breasts feel heavy, warm, and tender to the  touch. Engorgement peaks within 3-5 days after you give birth. The following recommendations can help ease engorgement:  Completely empty your breasts while breastfeeding or pumping. You may want to start by applying warm, moist heat (in the shower or with warm water-soaked hand towels) just before feeding or pumping. This increases circulation and helps the milk flow. If your baby does not completely empty your breasts while breastfeeding, pump any extra milk after he or she is finished.  Wear a snug bra (nursing or regular) or tank top for 1-2 days to signal your body to slightly decrease milk production.  Apply ice packs to your breasts, unless this is too uncomfortable for you.  Make sure that your baby is latched on and positioned properly while breastfeeding. If engorgement persists after 48 hours of following these recommendations, contact your health care provider or a lactation consultant. OVERALL HEALTH CARE RECOMMENDATIONS WHILE BREASTFEEDING  Eat healthy foods. Alternate between meals and snacks, eating 3 of each per day. Because what you eat affects your breast milk, some of the foods may make your baby more irritable than usual. Avoid eating these foods if you are sure that they are negatively affecting your baby.  Drink milk, fruit juice, and water to satisfy your thirst (about 10 glasses a day).   Rest often, relax, and continue to take your prenatal vitamins to prevent fatigue, stress, and anemia.  Continue  breast self-awareness checks.  Avoid chewing and smoking tobacco.  Avoid alcohol and drug use. Some medicines that may be harmful to your baby can pass through breast milk. It is important to ask your health care provider before taking any medicine, including all over-the-counter and prescription medicine as well as vitamin and herbal supplements. It is possible to become pregnant while breastfeeding. If birth control is desired, ask your health care provider about options that will be safe for your baby. SEEK MEDICAL CARE IF:   You feel like you want to stop breastfeeding or have become frustrated with breastfeeding.  You have painful breasts or nipples.  Your nipples are cracked or bleeding.  Your breasts are red, tender, or warm.  You have a swollen area on either breast.  You have a fever or chills.  You have nausea or vomiting.  You have drainage other than breast milk from your nipples.  Your breasts do not become full before feedings by the fifth day after you give birth.  You feel sad and depressed.  Your baby is too sleepy to eat well.  Your baby is having trouble sleeping.   Your baby is wetting less than 3 diapers in a 24-hour period.  Your baby has less than 3 stools in a 24-hour period.  Your baby's skin or the white part of his or her eyes becomes yellow.   Your baby is not gaining weight by 5 days of age. SEEK IMMEDIATE MEDICAL CARE IF:   Your baby is overly tired (lethargic) and does not want to wake up and feed.  Your baby develops an unexplained fever. Document Released: 02/09/2005 Document Revised: 02/14/2013 Document Reviewed: 08/03/2012 [employer] Patient Information 2015 [employer]. This information is not intended to replace advice given to you by your health care provider. Make sure you discuss any questions you have with your health care provider.  

## 2013-09-15 ENCOUNTER — Ambulatory Visit (INDEPENDENT_AMBULATORY_CARE_PROVIDER_SITE_OTHER): Payer: 59 | Admitting: Obstetrics & Gynecology

## 2013-09-15 VITALS — BP 119/73 | HR 68 | Wt 154.0 lb

## 2013-09-15 DIAGNOSIS — E785 Hyperlipidemia, unspecified: Secondary | ICD-10-CM

## 2013-09-15 DIAGNOSIS — Z3480 Encounter for supervision of other normal pregnancy, unspecified trimester: Secondary | ICD-10-CM

## 2013-09-15 DIAGNOSIS — Z23 Encounter for immunization: Secondary | ICD-10-CM

## 2013-09-15 DIAGNOSIS — Z3492 Encounter for supervision of normal pregnancy, unspecified, second trimester: Secondary | ICD-10-CM

## 2013-09-15 LAB — CBC
HEMATOCRIT: 35.3 % — AB (ref 36.0–46.0)
HEMOGLOBIN: 11.9 g/dL — AB (ref 12.0–15.0)
MCH: 32.8 pg (ref 26.0–34.0)
MCHC: 33.7 g/dL (ref 30.0–36.0)
MCV: 97.2 fL (ref 78.0–100.0)
Platelets: 167 10*3/uL (ref 150–400)
RBC: 3.63 MIL/uL — AB (ref 3.87–5.11)
RDW: 13.3 % (ref 11.5–15.5)
WBC: 11.8 10*3/uL — ABNORMAL HIGH (ref 4.0–10.5)

## 2013-09-15 NOTE — Patient Instructions (Signed)
Return to clinic for any obstetric concerns or go to [facility] for evaluation    RE: MyChart  Dear Ms. Burkhead  We are excited to introduce MyChart, a new best-in-class service that provides you online access to important information in your electronic medical record. We want to make it easier for you to view your health information - all in one secure location - when and where you need it. We expect MyChart will enhance the quality of care and service we provide. Use the activation code below to enroll in MyChart online at [email or web address]  When you register for MyChart, you can:    View your test results.   Communicate securely with your physician's office.    View your medical history, allergies, medications, and immunizations.   Conveniently print information such as your medication lists.  If you are age 33 or older and want a member of your family to have access to your record, you must provide written consent by completing a proxy form available at our facility. Please speak to our clinical staff about guidelines regarding accounts for patients younger than age 27.  As you activate your MyChart account and need any technical assistance, please call the MyChart technical support line at [phone]) or email your question to [email or web address]. If you email your question(s), please include your name, a return phone number and the best time to reach you.  Thank you for using MyChart as your new health and wellness resource!  MyChart Activation Code:  [email or web address] Expires: 11/14/2013  8:59 AM

## 2013-09-15 NOTE — Progress Notes (Signed)
Third trimester labs today. Tdap given. Will get BTL in the event of cesarean section.  If SVD, possible interval BTL at the time of hernia repair (to be done by Dr. Barry Dienes). No other complaints or concerns.  Fetal movement and labor precautions reviewed.

## 2013-09-16 ENCOUNTER — Encounter: Payer: Self-pay | Admitting: Obstetrics & Gynecology

## 2013-09-16 LAB — HIV ANTIBODY (ROUTINE TESTING W REFLEX): HIV 1&2 Ab, 4th Generation: NONREACTIVE

## 2013-09-16 LAB — RPR

## 2013-09-16 LAB — GLUCOSE TOLERANCE, 1 HOUR (50G) W/O FASTING: GLUCOSE 1 HOUR GTT: 96 mg/dL (ref 70–140)

## 2013-10-09 ENCOUNTER — Encounter: Payer: Self-pay | Admitting: Family Medicine

## 2013-10-09 ENCOUNTER — Ambulatory Visit (INDEPENDENT_AMBULATORY_CARE_PROVIDER_SITE_OTHER): Payer: 59 | Admitting: Family Medicine

## 2013-10-09 VITALS — BP 116/70 | HR 67 | Wt 159.0 lb

## 2013-10-09 DIAGNOSIS — Z3480 Encounter for supervision of other normal pregnancy, unspecified trimester: Secondary | ICD-10-CM

## 2013-10-09 DIAGNOSIS — Z3493 Encounter for supervision of normal pregnancy, unspecified, third trimester: Secondary | ICD-10-CM

## 2013-10-09 NOTE — Patient Instructions (Signed)
Third Trimester of Pregnancy The third trimester is from week 29 through week 42, months 7 through 9. The third trimester is a time when the fetus is growing rapidly. At the end of the ninth month, the fetus is about 20 inches in length and weighs 6-10 pounds.  BODY CHANGES Your body goes through many changes during pregnancy. The changes vary from woman to woman.   Your weight will continue to increase. You can expect to gain 25-35 pounds (11-16 kg) by the end of the pregnancy.  You may begin to get stretch marks on your hips, abdomen, and breasts.  You may urinate more often because the fetus is moving lower into your pelvis and pressing on your bladder.  You may develop or continue to have heartburn as a result of your pregnancy.  You may develop constipation because certain hormones are causing the muscles that push waste through your intestines to slow down.  You may develop hemorrhoids or swollen, bulging veins (varicose veins).  You may have pelvic pain because of the weight gain and pregnancy hormones relaxing your joints between the bones in your pelvis. Backaches may result from overexertion of the muscles supporting your posture.  You may have changes in your hair. These can include thickening of your hair, rapid growth, and changes in texture. Some women also have hair loss during or after pregnancy, or hair that feels dry or thin. Your hair will most likely return to normal after your baby is born.  Your breasts will continue to grow and be tender. A yellow discharge may leak from your breasts called colostrum.  Your belly button may stick out.  You may feel short of breath because of your expanding uterus.  You may notice the fetus "dropping," or moving lower in your abdomen.  You may have a bloody mucus discharge. This usually occurs a few days to a week before labor begins.  Your cervix becomes thin and soft (effaced) near your due date. WHAT TO EXPECT AT YOUR  PRENATAL EXAMS  You will have prenatal exams every 2 weeks until week 36. Then, you will have weekly prenatal exams. During a routine prenatal visit:  You will be weighed to make sure you and the fetus are growing normally.  Your blood pressure is taken.  Your abdomen will be measured to track your baby's growth.  The fetal heartbeat will be listened to.  Any test results from the previous visit will be discussed.  You may have a cervical check near your due date to see if you have effaced. At around 36 weeks, your caregiver will check your cervix. At the same time, your caregiver will also perform a test on the secretions of the vaginal tissue. This test is to determine if a type of bacteria, Group B streptococcus, is present. Your caregiver will explain this further. Your caregiver may ask you:  What your birth plan is.  How you are feeling.  If you are feeling the baby move.  If you have had any abnormal symptoms, such as leaking fluid, bleeding, severe headaches, or abdominal cramping.  If you have any questions. Other tests or screenings that may be performed during your third trimester include:  Blood tests that check for low iron levels (anemia).  Fetal testing to check the health, activity level, and growth of the fetus. Testing is done if you have certain medical conditions or if there are problems during the pregnancy. FALSE LABOR You may feel small, irregular contractions that   eventually go away. These are called Braxton Hicks contractions, or false labor. Contractions may last for hours, days, or even weeks before true labor sets in. If contractions come at regular intervals, intensify, or become painful, it is best to be seen by your caregiver.  SIGNS OF LABOR   Menstrual-like cramps.  Contractions that are 5 minutes apart or less.  Contractions that start on the top of the uterus and spread down to the lower abdomen and back.  A sense of increased pelvic  pressure or back pain.  A watery or bloody mucus discharge that comes from the vagina. If you have any of these signs before the 37th week of pregnancy, call your caregiver right away. You need to go to the hospital to get checked immediately. HOME CARE INSTRUCTIONS   Avoid all smoking, herbs, alcohol, and unprescribed drugs. These chemicals affect the formation and growth of the baby.  Follow your caregiver's instructions regarding medicine use. There are medicines that are either safe or unsafe to take during pregnancy.  Exercise only as directed by your caregiver. Experiencing uterine cramps is a good sign to stop exercising.  Continue to eat regular, healthy meals.  Wear a good support bra for breast tenderness.  Do not use hot tubs, steam rooms, or saunas.  Wear your seat belt at all times when driving.  Avoid raw meat, uncooked cheese, cat litter boxes, and soil used by cats. These carry germs that can cause birth defects in the baby.  Take your prenatal vitamins.  Try taking a stool softener (if your caregiver approves) if you develop constipation. Eat more high-fiber foods, such as fresh vegetables or fruit and whole grains. Drink plenty of fluids to keep your urine clear or pale yellow.  Take warm sitz baths to soothe any pain or discomfort caused by hemorrhoids. Use hemorrhoid cream if your caregiver approves.  If you develop varicose veins, wear support hose. Elevate your feet for 15 minutes, 3-4 times a day. Limit salt in your diet.  Avoid heavy lifting, wear low heal shoes, and practice good posture.  Rest a lot with your legs elevated if you have leg cramps or low back pain.  Visit your dentist if you have not gone during your pregnancy. Use a soft toothbrush to brush your teeth and be gentle when you floss.  A sexual relationship may be continued unless your caregiver directs you otherwise.  Do not travel far distances unless it is absolutely necessary and only  with the approval of your caregiver.  Take prenatal classes to understand, practice, and ask questions about the labor and delivery.  Make a trial run to the hospital.  Pack your hospital bag.  Prepare the baby's nursery.  Continue to go to all your prenatal visits as directed by your caregiver. SEEK MEDICAL CARE IF:  You are unsure if you are in labor or if your water has broken.  You have dizziness.  You have mild pelvic cramps, pelvic pressure, or nagging pain in your abdominal area.  You have persistent nausea, vomiting, or diarrhea.  You have a bad smelling vaginal discharge.  You have pain with urination. SEEK IMMEDIATE MEDICAL CARE IF:   You have a fever.  You are leaking fluid from your vagina.  You have spotting or bleeding from your vagina.  You have severe abdominal cramping or pain.  You have rapid weight loss or gain.  You have shortness of breath with chest pain.  You notice sudden or extreme swelling   of your face, hands, ankles, feet, or legs.  You have not felt your baby move in over an hour.  You have severe headaches that do not go away with medicine.  You have vision changes. Document Released: 02/03/2001 Document Revised: 02/14/2013 Document Reviewed: 04/12/2012 [employer] Patient Information 2015 [employer]. This information is not intended to replace advice given to you by your health care provider. Make sure you discuss any questions you have with your health care provider.  Breastfeeding Deciding to breastfeed is one of the best choices you can make for you and your baby. A change in hormones during pregnancy causes your breast tissue to grow and increases the number and size of your milk ducts. These hormones also allow proteins, sugars, and fats from your blood supply to make breast milk in your milk-producing glands. Hormones prevent breast milk from being released before your baby is born as well as prompt milk flow after birth. Once  breastfeeding has begun, thoughts of your baby, as well as his or her sucking or crying, can stimulate the release of milk from your milk-producing glands.  BENEFITS OF BREASTFEEDING For Your Baby  Your first milk (colostrum) helps your baby's digestive system function better.   There are antibodies in your milk that help your baby fight off infections.   Your baby has a lower incidence of asthma, allergies, and sudden infant death syndrome.   The nutrients in breast milk are better for your baby than infant formulas and are designed uniquely for your baby's needs.   Breast milk improves your baby's brain development.   Your baby is less likely to develop other conditions, such as childhood obesity, asthma, or type 2 diabetes mellitus.  For You   Breastfeeding helps to create a very special bond between you and your baby.   Breastfeeding is convenient. Breast milk is always available at the correct temperature and costs nothing.   Breastfeeding helps to burn calories and helps you lose the weight gained during pregnancy.   Breastfeeding makes your uterus contract to its prepregnancy size faster and slows bleeding (lochia) after you give birth.   Breastfeeding helps to lower your risk of developing type 2 diabetes mellitus, osteoporosis, and breast or ovarian cancer later in life. SIGNS THAT YOUR BABY IS HUNGRY Early Signs of Hunger  Increased alertness or activity.  Stretching.  Movement of the head from side to side.  Movement of the head and opening of the mouth when the corner of the mouth or cheek is stroked (rooting).  Increased sucking sounds, smacking lips, cooing, sighing, or squeaking.  Hand-to-mouth movements.  Increased sucking of fingers or hands. Late Signs of Hunger  Fussing.  Intermittent crying. Extreme Signs of Hunger Signs of extreme hunger will require calming and consoling before your baby will be able to breastfeed successfully. Do not  wait for the following signs of extreme hunger to occur before you initiate breastfeeding:   Restlessness.  A loud, strong cry.   Screaming. BREASTFEEDING BASICS Breastfeeding Initiation  Find a comfortable place to sit or lie down, with your neck and back well supported.  Place a pillow or rolled up blanket under your baby to bring him or her to the level of your breast (if you are seated). Nursing pillows are specially designed to help support your arms and your baby while you breastfeed.  Make sure that your baby's abdomen is facing your abdomen.   Gently massage your breast. With your fingertips, massage from your chest   wall toward your nipple in a circular motion. This encourages milk flow. You may need to continue this action during the feeding if your milk flows slowly.  Support your breast with 4 fingers underneath and your thumb above your nipple. Make sure your fingers are well away from your nipple and your baby's mouth.   Stroke your baby's lips gently with your finger or nipple.   When your baby's mouth is open wide enough, quickly bring your baby to your breast, placing your entire nipple and as much of the colored area around your nipple (areola) as possible into your baby's mouth.   More areola should be visible above your baby's upper lip than below the lower lip.   Your baby's tongue should be between his or her lower gum and your breast.   Ensure that your baby's mouth is correctly positioned around your nipple (latched). Your baby's lips should create a seal on your breast and be turned out (everted).  It is common for your baby to suck about 2-3 minutes in order to start the flow of breast milk. Latching Teaching your baby how to latch on to your breast properly is very important. An improper latch can cause nipple pain and decreased milk supply for you and poor weight gain in your baby. Also, if your baby is not latched onto your nipple properly, he or she  may swallow some air during feeding. This can make your baby fussy. Burping your baby when you switch breasts during the feeding can help to get rid of the air. However, teaching your baby to latch on properly is still the best way to prevent fussiness from swallowing air while breastfeeding. Signs that your baby has successfully latched on to your nipple:    Silent tugging or silent sucking, without causing you pain.   Swallowing heard between every 3-4 sucks.    Muscle movement above and in front of his or her ears while sucking.  Signs that your baby has not successfully latched on to nipple:   Sucking sounds or smacking sounds from your baby while breastfeeding.  Nipple pain. If you think your baby has not latched on correctly, slip your finger into the corner of your baby's mouth to break the suction and place it between your baby's gums. Attempt breastfeeding initiation again. Signs of Successful Breastfeeding Signs from your baby:   A gradual decrease in the number of sucks or complete cessation of sucking.   Falling asleep.   Relaxation of his or her body.   Retention of a small amount of milk in his or her mouth.   Letting go of your breast by himself or herself. Signs from you:  Breasts that have increased in firmness, weight, and size 1-3 hours after feeding.   Breasts that are softer immediately after breastfeeding.  Increased milk volume, as well as a change in milk consistency and color by the fifth day of breastfeeding.   Nipples that are not sore, cracked, or bleeding. Signs That Your Baby is Getting Enough Milk  Wetting at least 3 diapers in a 24-hour period. The urine should be clear and pale yellow by age 5 days.  At least 3 stools in a 24-hour period by age 5 days. The stool should be soft and yellow.  At least 3 stools in a 24-hour period by age 7 days. The stool should be seedy and yellow.  No loss of weight greater than 10% of birth weight  during the first 3   days of age.  Average weight gain of 4-7 ounces (113-198 g) per week after age 4 days.  Consistent daily weight gain by age 5 days, without weight loss after the age of 2 weeks. After a feeding, your baby may spit up a small amount. This is common. BREASTFEEDING FREQUENCY AND DURATION Frequent feeding will help you make more milk and can prevent sore nipples and breast engorgement. Breastfeed when you feel the need to reduce the fullness of your breasts or when your baby shows signs of hunger. This is called "breastfeeding on demand." Avoid introducing a pacifier to your baby while you are working to establish breastfeeding (the first 4-6 weeks after your baby is born). After this time you may choose to use a pacifier. Research has shown that pacifier use during the first year of a baby's life decreases the risk of sudden infant death syndrome (SIDS). Allow your baby to feed on each breast as long as he or she wants. Breastfeed until your baby is finished feeding. When your baby unlatches or falls asleep while feeding from the first breast, offer the second breast. Because newborns are often sleepy in the first few weeks of life, you may need to awaken your baby to get him or her to feed. Breastfeeding times will vary from baby to baby. However, the following rules can serve as a guide to help you ensure that your baby is properly fed:  Newborns (babies 4 weeks of age or younger) may breastfeed every 1-3 hours.  Newborns should not go longer than 3 hours during the day or 5 hours during the night without breastfeeding.  You should breastfeed your baby a minimum of 8 times in a 24-hour period until you begin to introduce solid foods to your baby at around 6 months of age. BREAST MILK PUMPING Pumping and storing breast milk allows you to ensure that your baby is exclusively fed your breast milk, even at times when you are unable to breastfeed. This is especially important if you are  going back to work while you are still breastfeeding or when you are not able to be present during feedings. Your lactation consultant can give you guidelines on how long it is safe to store breast milk.  A breast pump is a machine that allows you to pump milk from your breast into a sterile bottle. The pumped breast milk can then be stored in a refrigerator or freezer. Some breast pumps are operated by hand, while others use electricity. Ask your lactation consultant which type will work best for you. Breast pumps can be purchased, but some hospitals and breastfeeding support groups lease breast pumps on a monthly basis. A lactation consultant can teach you how to hand express breast milk, if you prefer not to use a pump.  CARING FOR YOUR BREASTS WHILE YOU BREASTFEED Nipples can become dry, cracked, and sore while breastfeeding. The following recommendations can help keep your breasts moisturized and healthy:  Avoid using soap on your nipples.   Wear a supportive bra. Although not required, special nursing bras and tank tops are designed to allow access to your breasts for breastfeeding without taking off your entire bra or top. Avoid wearing underwire-style bras or extremely tight bras.  Air dry your nipples for 3-4minutes after each feeding.   Use only cotton bra pads to absorb leaked breast milk. Leaking of breast milk between feedings is normal.   Use lanolin on your nipples after breastfeeding. Lanolin helps to maintain your skin's   normal moisture barrier. If you use pure lanolin, you do not need to wash it off before feeding your baby again. Pure lanolin is not toxic to your baby. You may also hand express a few drops of breast milk and gently massage that milk into your nipples and allow the milk to air dry. In the first few weeks after giving birth, some women experience extremely full breasts (engorgement). Engorgement can make your breasts feel heavy, warm, and tender to the touch.  Engorgement peaks within 3-5 days after you give birth. The following recommendations can help ease engorgement:  Completely empty your breasts while breastfeeding or pumping. You may want to start by applying warm, moist heat (in the shower or with warm water-soaked hand towels) just before feeding or pumping. This increases circulation and helps the milk flow. If your baby does not completely empty your breasts while breastfeeding, pump any extra milk after he or she is finished.  Wear a snug bra (nursing or regular) or tank top for 1-2 days to signal your body to slightly decrease milk production.  Apply ice packs to your breasts, unless this is too uncomfortable for you.  Make sure that your baby is latched on and positioned properly while breastfeeding. If engorgement persists after 48 hours of following these recommendations, contact your health care provider or a lactation consultant. OVERALL HEALTH CARE RECOMMENDATIONS WHILE BREASTFEEDING  Eat healthy foods. Alternate between meals and snacks, eating 3 of each per day. Because what you eat affects your breast milk, some of the foods may make your baby more irritable than usual. Avoid eating these foods if you are sure that they are negatively affecting your baby.  Drink milk, fruit juice, and water to satisfy your thirst (about 10 glasses a day).   Rest often, relax, and continue to take your prenatal vitamins to prevent fatigue, stress, and anemia.  Continue breast self-awareness checks.  Avoid chewing and smoking tobacco.  Avoid alcohol and drug use. Some medicines that may be harmful to your baby can pass through breast milk. It is important to ask your health care provider before taking any medicine, including all over-the-counter and prescription medicine as well as vitamin and herbal supplements. It is possible to become pregnant while breastfeeding. If birth control is desired, ask your health care provider about options that  will be safe for your baby. SEEK MEDICAL CARE IF:   You feel like you want to stop breastfeeding or have become frustrated with breastfeeding.  You have painful breasts or nipples.  Your nipples are cracked or bleeding.  Your breasts are red, tender, or warm.  You have a swollen area on either breast.  You have a fever or chills.  You have nausea or vomiting.  You have drainage other than breast milk from your nipples.  Your breasts do not become full before feedings by the fifth day after you give birth.  You feel sad and depressed.  Your baby is too sleepy to eat well.  Your baby is having trouble sleeping.   Your baby is wetting less than 3 diapers in a 24-hour period.  Your baby has less than 3 stools in a 24-hour period.  Your baby's skin or the white part of his or her eyes becomes yellow.   Your baby is not gaining weight by 5 days of age. SEEK IMMEDIATE MEDICAL CARE IF:   Your baby is overly tired (lethargic) and does not want to wake up and feed.  Your baby   develops an unexplained fever. Document Released: 02/09/2005 Document Revised: 02/14/2013 Document Reviewed: 08/03/2012 [employer] Patient Information 2015 [employer]. This information is not intended to replace advice given to you by your health care provider. Make sure you discuss any questions you have with your health care provider.  

## 2013-10-09 NOTE — Progress Notes (Signed)
Doing well--discussed previous delivery--reports fundal pressure when heart rate was in the 40's.  Denies suprapubic pressure.  Had 3rd degree. Had 88 lb weight gain during that pregnancy.  Advised to limit carbs/sweets, weight gain.

## 2013-10-24 ENCOUNTER — Ambulatory Visit (INDEPENDENT_AMBULATORY_CARE_PROVIDER_SITE_OTHER): Payer: 59 | Admitting: Obstetrics & Gynecology

## 2013-10-24 VITALS — BP 119/66 | HR 74 | Wt 162.0 lb

## 2013-10-24 DIAGNOSIS — Z3480 Encounter for supervision of other normal pregnancy, unspecified trimester: Secondary | ICD-10-CM

## 2013-10-24 DIAGNOSIS — Z3493 Encounter for supervision of normal pregnancy, unspecified, third trimester: Secondary | ICD-10-CM

## 2013-10-24 NOTE — Patient Instructions (Signed)
Return to clinic for any obstetric concerns or go to [facility] for evaluation  

## 2013-10-24 NOTE — Progress Notes (Signed)
No complaints or concerns.  Labor and fetal movement precautions reviewed. 

## 2013-11-06 ENCOUNTER — Encounter: Payer: Self-pay | Admitting: Obstetrics and Gynecology

## 2013-11-06 ENCOUNTER — Ambulatory Visit (INDEPENDENT_AMBULATORY_CARE_PROVIDER_SITE_OTHER): Payer: 59 | Admitting: Obstetrics and Gynecology

## 2013-11-06 VITALS — BP 119/76 | HR 75 | Wt 164.4 lb

## 2013-11-06 DIAGNOSIS — Z3493 Encounter for supervision of normal pregnancy, unspecified, third trimester: Secondary | ICD-10-CM

## 2013-11-06 DIAGNOSIS — Z3480 Encounter for supervision of other normal pregnancy, unspecified trimester: Secondary | ICD-10-CM

## 2013-11-06 DIAGNOSIS — O99820 Streptococcus B carrier state complicating pregnancy: Secondary | ICD-10-CM

## 2013-11-06 DIAGNOSIS — O9989 Other specified diseases and conditions complicating pregnancy, childbirth and the puerperium: Secondary | ICD-10-CM

## 2013-11-06 DIAGNOSIS — Z22330 Carrier of Group B streptococcus: Secondary | ICD-10-CM

## 2013-11-06 DIAGNOSIS — IMO0002 Reserved for concepts with insufficient information to code with codable children: Secondary | ICD-10-CM

## 2013-11-06 NOTE — Progress Notes (Signed)
Patient is doing well without complaints. FM/PTL precautions reviewed.  

## 2013-11-20 ENCOUNTER — Ambulatory Visit (INDEPENDENT_AMBULATORY_CARE_PROVIDER_SITE_OTHER): Payer: 59 | Admitting: Obstetrics & Gynecology

## 2013-11-20 ENCOUNTER — Encounter: Payer: Self-pay | Admitting: Obstetrics & Gynecology

## 2013-11-20 VITALS — BP 125/65 | HR 83 | Wt 165.8 lb

## 2013-11-20 DIAGNOSIS — Z3493 Encounter for supervision of normal pregnancy, unspecified, third trimester: Secondary | ICD-10-CM

## 2013-11-20 DIAGNOSIS — Z3480 Encounter for supervision of other normal pregnancy, unspecified trimester: Secondary | ICD-10-CM

## 2013-11-20 DIAGNOSIS — Z113 Encounter for screening for infections with a predominantly sexual mode of transmission: Secondary | ICD-10-CM

## 2013-11-20 LAB — OB RESULTS CONSOLE GC/CHLAMYDIA
Chlamydia: NEGATIVE
Gonorrhea: NEGATIVE

## 2013-11-20 NOTE — Progress Notes (Signed)
GBS positive in urine, GC/Chlam culture done.  No other complaints or concerns.  Labor and fetal movement precautions reviewed.

## 2013-11-20 NOTE — Patient Instructions (Signed)
Return to clinic for any obstetric concerns or go to [facility] for evaluation  

## 2013-11-21 LAB — GC/CHLAMYDIA PROBE AMP
CT Probe RNA: NEGATIVE
GC Probe RNA: NEGATIVE

## 2013-11-28 ENCOUNTER — Encounter: Payer: Self-pay | Admitting: Family Medicine

## 2013-11-28 ENCOUNTER — Ambulatory Visit (INDEPENDENT_AMBULATORY_CARE_PROVIDER_SITE_OTHER): Payer: 59 | Admitting: Family Medicine

## 2013-11-28 VITALS — BP 125/77 | HR 77 | Wt 169.8 lb

## 2013-11-28 DIAGNOSIS — Z3493 Encounter for supervision of normal pregnancy, unspecified, third trimester: Secondary | ICD-10-CM

## 2013-11-28 NOTE — Patient Instructions (Signed)
Third Trimester of Pregnancy The third trimester is from week 29 through week 42, months 7 through 9. The third trimester is a time when the fetus is growing rapidly. At the end of the ninth month, the fetus is about 20 inches in length and weighs 6-10 pounds.  BODY CHANGES Your body goes through many changes during pregnancy. The changes vary from woman to woman.   Your weight will continue to increase. You can expect to gain 25-35 pounds (11-16 kg) by the end of the pregnancy.  You may begin to get stretch marks on your hips, abdomen, and breasts.  You may urinate more often because the fetus is moving lower into your pelvis and pressing on your bladder.  You may develop or continue to have heartburn as a result of your pregnancy.  You may develop constipation because certain hormones are causing the muscles that push waste through your intestines to slow down.  You may develop hemorrhoids or swollen, bulging veins (varicose veins).  You may have pelvic pain because of the weight gain and pregnancy hormones relaxing your joints between the bones in your pelvis. Backaches may result from overexertion of the muscles supporting your posture.  You may have changes in your hair. These can include thickening of your hair, rapid growth, and changes in texture. Some women also have hair loss during or after pregnancy, or hair that feels dry or thin. Your hair will most likely return to normal after your baby is born.  Your breasts will continue to grow and be tender. A yellow discharge may leak from your breasts called colostrum.  Your belly button may stick out.  You may feel short of breath because of your expanding uterus.  You may notice the fetus "dropping," or moving lower in your abdomen.  You may have a bloody mucus discharge. This usually occurs a few days to a week before labor begins.  Your cervix becomes thin and soft (effaced) near your due date. WHAT TO EXPECT AT YOUR  PRENATAL EXAMS  You will have prenatal exams every 2 weeks until week 36. Then, you will have weekly prenatal exams. During a routine prenatal visit:  You will be weighed to make sure you and the fetus are growing normally.  Your blood pressure is taken.  Your abdomen will be measured to track your baby's growth.  The fetal heartbeat will be listened to.  Any test results from the previous visit will be discussed.  You may have a cervical check near your due date to see if you have effaced. At around 36 weeks, your caregiver will check your cervix. At the same time, your caregiver will also perform a test on the secretions of the vaginal tissue. This test is to determine if a type of bacteria, Group B streptococcus, is present. Your caregiver will explain this further. Your caregiver may ask you:  What your birth plan is.  How you are feeling.  If you are feeling the baby move.  If you have had any abnormal symptoms, such as leaking fluid, bleeding, severe headaches, or abdominal cramping.  If you have any questions. Other tests or screenings that may be performed during your third trimester include:  Blood tests that check for low iron levels (anemia).  Fetal testing to check the health, activity level, and growth of the fetus. Testing is done if you have certain medical conditions or if there are problems during the pregnancy. FALSE LABOR You may feel small, irregular contractions that   eventually go away. These are called Braxton Hicks contractions, or false labor. Contractions may last for hours, days, or even weeks before true labor sets in. If contractions come at regular intervals, intensify, or become painful, it is best to be seen by your caregiver.  SIGNS OF LABOR   Menstrual-like cramps.  Contractions that are 5 minutes apart or less.  Contractions that start on the top of the uterus and spread down to the lower abdomen and back.  A sense of increased pelvic  pressure or back pain.  A watery or bloody mucus discharge that comes from the vagina. If you have any of these signs before the 37th week of pregnancy, call your caregiver right away. You need to go to the hospital to get checked immediately. HOME CARE INSTRUCTIONS   Avoid all smoking, herbs, alcohol, and unprescribed drugs. These chemicals affect the formation and growth of the baby.  Follow your caregiver's instructions regarding medicine use. There are medicines that are either safe or unsafe to take during pregnancy.  Exercise only as directed by your caregiver. Experiencing uterine cramps is a good sign to stop exercising.  Continue to eat regular, healthy meals.  Wear a good support bra for breast tenderness.  Do not use hot tubs, steam rooms, or saunas.  Wear your seat belt at all times when driving.  Avoid raw meat, uncooked cheese, cat litter boxes, and soil used by cats. These carry germs that can cause birth defects in the baby.  Take your prenatal vitamins.  Try taking a stool softener (if your caregiver approves) if you develop constipation. Eat more high-fiber foods, such as fresh vegetables or fruit and whole grains. Drink plenty of fluids to keep your urine clear or pale yellow.  Take warm sitz baths to soothe any pain or discomfort caused by hemorrhoids. Use hemorrhoid cream if your caregiver approves.  If you develop varicose veins, wear support hose. Elevate your feet for 15 minutes, 3-4 times a day. Limit salt in your diet.  Avoid heavy lifting, wear low heal shoes, and practice good posture.  Rest a lot with your legs elevated if you have leg cramps or low back pain.  Visit your dentist if you have not gone during your pregnancy. Use a soft toothbrush to brush your teeth and be gentle when you floss.  A sexual relationship may be continued unless your caregiver directs you otherwise.  Do not travel far distances unless it is absolutely necessary and only  with the approval of your caregiver.  Take prenatal classes to understand, practice, and ask questions about the labor and delivery.  Make a trial run to the hospital.  Pack your hospital bag.  Prepare the baby's nursery.  Continue to go to all your prenatal visits as directed by your caregiver. SEEK MEDICAL CARE IF:  You are unsure if you are in labor or if your water has broken.  You have dizziness.  You have mild pelvic cramps, pelvic pressure, or nagging pain in your abdominal area.  You have persistent nausea, vomiting, or diarrhea.  You have a bad smelling vaginal discharge.  You have pain with urination. SEEK IMMEDIATE MEDICAL CARE IF:   You have a fever.  You are leaking fluid from your vagina.  You have spotting or bleeding from your vagina.  You have severe abdominal cramping or pain.  You have rapid weight loss or gain.  You have shortness of breath with chest pain.  You notice sudden or extreme swelling   of your face, hands, ankles, feet, or legs.  You have not felt your baby move in over an hour.  You have severe headaches that do not go away with medicine.  You have vision changes. Document Released: 02/03/2001 Document Revised: 02/14/2013 Document Reviewed: 04/12/2012 [employer] Patient Information 2015 [employer]. This information is not intended to replace advice given to you by your health care provider. Make sure you discuss any questions you have with your health care provider.  Breastfeeding Deciding to breastfeed is one of the best choices you can make for you and your baby. A change in hormones during pregnancy causes your breast tissue to grow and increases the number and size of your milk ducts. These hormones also allow proteins, sugars, and fats from your blood supply to make breast milk in your milk-producing glands. Hormones prevent breast milk from being released before your baby is born as well as prompt milk flow after birth. Once  breastfeeding has begun, thoughts of your baby, as well as his or her sucking or crying, can stimulate the release of milk from your milk-producing glands.  BENEFITS OF BREASTFEEDING For Your Baby  Your first milk (colostrum) helps your baby's digestive system function better.   There are antibodies in your milk that help your baby fight off infections.   Your baby has a lower incidence of asthma, allergies, and sudden infant death syndrome.   The nutrients in breast milk are better for your baby than infant formulas and are designed uniquely for your baby's needs.   Breast milk improves your baby's brain development.   Your baby is less likely to develop other conditions, such as childhood obesity, asthma, or type 2 diabetes mellitus.  For You   Breastfeeding helps to create a very special bond between you and your baby.   Breastfeeding is convenient. Breast milk is always available at the correct temperature and costs nothing.   Breastfeeding helps to burn calories and helps you lose the weight gained during pregnancy.   Breastfeeding makes your uterus contract to its prepregnancy size faster and slows bleeding (lochia) after you give birth.   Breastfeeding helps to lower your risk of developing type 2 diabetes mellitus, osteoporosis, and breast or ovarian cancer later in life. SIGNS THAT YOUR BABY IS HUNGRY Early Signs of Hunger  Increased alertness or activity.  Stretching.  Movement of the head from side to side.  Movement of the head and opening of the mouth when the corner of the mouth or cheek is stroked (rooting).  Increased sucking sounds, smacking lips, cooing, sighing, or squeaking.  Hand-to-mouth movements.  Increased sucking of fingers or hands. Late Signs of Hunger  Fussing.  Intermittent crying. Extreme Signs of Hunger Signs of extreme hunger will require calming and consoling before your baby will be able to breastfeed successfully. Do not  wait for the following signs of extreme hunger to occur before you initiate breastfeeding:   Restlessness.  A loud, strong cry.   Screaming. BREASTFEEDING BASICS Breastfeeding Initiation  Find a comfortable place to sit or lie down, with your neck and back well supported.  Place a pillow or rolled up blanket under your baby to bring him or her to the level of your breast (if you are seated). Nursing pillows are specially designed to help support your arms and your baby while you breastfeed.  Make sure that your baby's abdomen is facing your abdomen.   Gently massage your breast. With your fingertips, massage from your chest   wall toward your nipple in a circular motion. This encourages milk flow. You may need to continue this action during the feeding if your milk flows slowly.  Support your breast with 4 fingers underneath and your thumb above your nipple. Make sure your fingers are well away from your nipple and your baby's mouth.   Stroke your baby's lips gently with your finger or nipple.   When your baby's mouth is open wide enough, quickly bring your baby to your breast, placing your entire nipple and as much of the colored area around your nipple (areola) as possible into your baby's mouth.   More areola should be visible above your baby's upper lip than below the lower lip.   Your baby's tongue should be between his or her lower gum and your breast.   Ensure that your baby's mouth is correctly positioned around your nipple (latched). Your baby's lips should create a seal on your breast and be turned out (everted).  It is common for your baby to suck about 2-3 minutes in order to start the flow of breast milk. Latching Teaching your baby how to latch on to your breast properly is very important. An improper latch can cause nipple pain and decreased milk supply for you and poor weight gain in your baby. Also, if your baby is not latched onto your nipple properly, he or she  may swallow some air during feeding. This can make your baby fussy. Burping your baby when you switch breasts during the feeding can help to get rid of the air. However, teaching your baby to latch on properly is still the best way to prevent fussiness from swallowing air while breastfeeding. Signs that your baby has successfully latched on to your nipple:    Silent tugging or silent sucking, without causing you pain.   Swallowing heard between every 3-4 sucks.    Muscle movement above and in front of his or her ears while sucking.  Signs that your baby has not successfully latched on to nipple:   Sucking sounds or smacking sounds from your baby while breastfeeding.  Nipple pain. If you think your baby has not latched on correctly, slip your finger into the corner of your baby's mouth to break the suction and place it between your baby's gums. Attempt breastfeeding initiation again. Signs of Successful Breastfeeding Signs from your baby:   A gradual decrease in the number of sucks or complete cessation of sucking.   Falling asleep.   Relaxation of his or her body.   Retention of a small amount of milk in his or her mouth.   Letting go of your breast by himself or herself. Signs from you:  Breasts that have increased in firmness, weight, and size 1-3 hours after feeding.   Breasts that are softer immediately after breastfeeding.  Increased milk volume, as well as a change in milk consistency and color by the fifth day of breastfeeding.   Nipples that are not sore, cracked, or bleeding. Signs That Your Baby is Getting Enough Milk  Wetting at least 3 diapers in a 24-hour period. The urine should be clear and pale yellow by age 5 days.  At least 3 stools in a 24-hour period by age 5 days. The stool should be soft and yellow.  At least 3 stools in a 24-hour period by age 7 days. The stool should be seedy and yellow.  No loss of weight greater than 10% of birth weight  during the first 3   days of age.  Average weight gain of 4-7 ounces (113-198 g) per week after age 4 days.  Consistent daily weight gain by age 5 days, without weight loss after the age of 2 weeks. After a feeding, your baby may spit up a small amount. This is common. BREASTFEEDING FREQUENCY AND DURATION Frequent feeding will help you make more milk and can prevent sore nipples and breast engorgement. Breastfeed when you feel the need to reduce the fullness of your breasts or when your baby shows signs of hunger. This is called "breastfeeding on demand." Avoid introducing a pacifier to your baby while you are working to establish breastfeeding (the first 4-6 weeks after your baby is born). After this time you may choose to use a pacifier. Research has shown that pacifier use during the first year of a baby's life decreases the risk of sudden infant death syndrome (SIDS). Allow your baby to feed on each breast as long as he or she wants. Breastfeed until your baby is finished feeding. When your baby unlatches or falls asleep while feeding from the first breast, offer the second breast. Because newborns are often sleepy in the first few weeks of life, you may need to awaken your baby to get him or her to feed. Breastfeeding times will vary from baby to baby. However, the following rules can serve as a guide to help you ensure that your baby is properly fed:  Newborns (babies 4 weeks of age or younger) may breastfeed every 1-3 hours.  Newborns should not go longer than 3 hours during the day or 5 hours during the night without breastfeeding.  You should breastfeed your baby a minimum of 8 times in a 24-hour period until you begin to introduce solid foods to your baby at around 6 months of age. BREAST MILK PUMPING Pumping and storing breast milk allows you to ensure that your baby is exclusively fed your breast milk, even at times when you are unable to breastfeed. This is especially important if you are  going back to work while you are still breastfeeding or when you are not able to be present during feedings. Your lactation consultant can give you guidelines on how long it is safe to store breast milk.  A breast pump is a machine that allows you to pump milk from your breast into a sterile bottle. The pumped breast milk can then be stored in a refrigerator or freezer. Some breast pumps are operated by hand, while others use electricity. Ask your lactation consultant which type will work best for you. Breast pumps can be purchased, but some hospitals and breastfeeding support groups lease breast pumps on a monthly basis. A lactation consultant can teach you how to hand express breast milk, if you prefer not to use a pump.  CARING FOR YOUR BREASTS WHILE YOU BREASTFEED Nipples can become dry, cracked, and sore while breastfeeding. The following recommendations can help keep your breasts moisturized and healthy:  Avoid using soap on your nipples.   Wear a supportive bra. Although not required, special nursing bras and tank tops are designed to allow access to your breasts for breastfeeding without taking off your entire bra or top. Avoid wearing underwire-style bras or extremely tight bras.  Air dry your nipples for 3-4minutes after each feeding.   Use only cotton bra pads to absorb leaked breast milk. Leaking of breast milk between feedings is normal.   Use lanolin on your nipples after breastfeeding. Lanolin helps to maintain your skin's   normal moisture barrier. If you use pure lanolin, you do not need to wash it off before feeding your baby again. Pure lanolin is not toxic to your baby. You may also hand express a few drops of breast milk and gently massage that milk into your nipples and allow the milk to air dry. In the first few weeks after giving birth, some women experience extremely full breasts (engorgement). Engorgement can make your breasts feel heavy, warm, and tender to the touch.  Engorgement peaks within 3-5 days after you give birth. The following recommendations can help ease engorgement:  Completely empty your breasts while breastfeeding or pumping. You may want to start by applying warm, moist heat (in the shower or with warm water-soaked hand towels) just before feeding or pumping. This increases circulation and helps the milk flow. If your baby does not completely empty your breasts while breastfeeding, pump any extra milk after he or she is finished.  Wear a snug bra (nursing or regular) or tank top for 1-2 days to signal your body to slightly decrease milk production.  Apply ice packs to your breasts, unless this is too uncomfortable for you.  Make sure that your baby is latched on and positioned properly while breastfeeding. If engorgement persists after 48 hours of following these recommendations, contact your health care provider or a lactation consultant. OVERALL HEALTH CARE RECOMMENDATIONS WHILE BREASTFEEDING  Eat healthy foods. Alternate between meals and snacks, eating 3 of each per day. Because what you eat affects your breast milk, some of the foods may make your baby more irritable than usual. Avoid eating these foods if you are sure that they are negatively affecting your baby.  Drink milk, fruit juice, and water to satisfy your thirst (about 10 glasses a day).   Rest often, relax, and continue to take your prenatal vitamins to prevent fatigue, stress, and anemia.  Continue breast self-awareness checks.  Avoid chewing and smoking tobacco.  Avoid alcohol and drug use. Some medicines that may be harmful to your baby can pass through breast milk. It is important to ask your health care provider before taking any medicine, including all over-the-counter and prescription medicine as well as vitamin and herbal supplements. It is possible to become pregnant while breastfeeding. If birth control is desired, ask your health care provider about options that  will be safe for your baby. SEEK MEDICAL CARE IF:   You feel like you want to stop breastfeeding or have become frustrated with breastfeeding.  You have painful breasts or nipples.  Your nipples are cracked or bleeding.  Your breasts are red, tender, or warm.  You have a swollen area on either breast.  You have a fever or chills.  You have nausea or vomiting.  You have drainage other than breast milk from your nipples.  Your breasts do not become full before feedings by the fifth day after you give birth.  You feel sad and depressed.  Your baby is too sleepy to eat well.  Your baby is having trouble sleeping.   Your baby is wetting less than 3 diapers in a 24-hour period.  Your baby has less than 3 stools in a 24-hour period.  Your baby's skin or the white part of his or her eyes becomes yellow.   Your baby is not gaining weight by 5 days of age. SEEK IMMEDIATE MEDICAL CARE IF:   Your baby is overly tired (lethargic) and does not want to wake up and feed.  Your baby   develops an unexplained fever. Document Released: 02/09/2005 Document Revised: 02/14/2013 Document Reviewed: 08/03/2012 [employer] Patient Information 2015 [employer]. This information is not intended to replace advice given to you by your health care provider. Make sure you discuss any questions you have with your health care provider.  

## 2013-11-28 NOTE — Progress Notes (Signed)
Doing well No complatints--few contractions

## 2013-12-05 ENCOUNTER — Ambulatory Visit (INDEPENDENT_AMBULATORY_CARE_PROVIDER_SITE_OTHER): Payer: 59 | Admitting: Obstetrics & Gynecology

## 2013-12-05 ENCOUNTER — Encounter: Payer: Self-pay | Admitting: Obstetrics & Gynecology

## 2013-12-05 VITALS — BP 120/83 | HR 81 | Wt 169.0 lb

## 2013-12-05 DIAGNOSIS — Z3493 Encounter for supervision of normal pregnancy, unspecified, third trimester: Secondary | ICD-10-CM

## 2013-12-05 NOTE — Progress Notes (Signed)
Routine visit. Good FM. No problems. Labor precautions reviewed. 

## 2013-12-11 ENCOUNTER — Ambulatory Visit (INDEPENDENT_AMBULATORY_CARE_PROVIDER_SITE_OTHER): Payer: 59 | Admitting: Family Medicine

## 2013-12-11 VITALS — BP 122/80 | HR 66 | Wt 172.0 lb

## 2013-12-11 DIAGNOSIS — Z3493 Encounter for supervision of normal pregnancy, unspecified, third trimester: Secondary | ICD-10-CM

## 2013-12-11 NOTE — Progress Notes (Signed)
Membranes stripped

## 2013-12-11 NOTE — Patient Instructions (Signed)
Third Trimester of Pregnancy The third trimester is from week 29 through week 42, months 7 through 9. The third trimester is a time when the fetus is growing rapidly. At the end of the ninth month, the fetus is about 20 inches in length and weighs 6-10 pounds.  BODY CHANGES Your body goes through many changes during pregnancy. The changes vary from woman to woman.   Your weight will continue to increase. You can expect to gain 25-35 pounds (11-16 kg) by the end of the pregnancy.  You may begin to get stretch marks on your hips, abdomen, and breasts.  You may urinate more often because the fetus is moving lower into your pelvis and pressing on your bladder.  You may develop or continue to have heartburn as a result of your pregnancy.  You may develop constipation because certain hormones are causing the muscles that push waste through your intestines to slow down.  You may develop hemorrhoids or swollen, bulging veins (varicose veins).  You may have pelvic pain because of the weight gain and pregnancy hormones relaxing your joints between the bones in your pelvis. Backaches may result from overexertion of the muscles supporting your posture.  You may have changes in your hair. These can include thickening of your hair, rapid growth, and changes in texture. Some women also have hair loss during or after pregnancy, or hair that feels dry or thin. Your hair will most likely return to normal after your baby is born.  Your breasts will continue to grow and be tender. A yellow discharge may leak from your breasts called colostrum.  Your belly button may stick out.  You may feel short of breath because of your expanding uterus.  You may notice the fetus "dropping," or moving lower in your abdomen.  You may have a bloody mucus discharge. This usually occurs a few days to a week before labor begins.  Your cervix becomes thin and soft (effaced) near your due date. WHAT TO EXPECT AT YOUR  PRENATAL EXAMS  You will have prenatal exams every 2 weeks until week 36. Then, you will have weekly prenatal exams. During a routine prenatal visit:  You will be weighed to make sure you and the fetus are growing normally.  Your blood pressure is taken.  Your abdomen will be measured to track your baby's growth.  The fetal heartbeat will be listened to.  Any test results from the previous visit will be discussed.  You may have a cervical check near your due date to see if you have effaced. At around 36 weeks, your caregiver will check your cervix. At the same time, your caregiver will also perform a test on the secretions of the vaginal tissue. This test is to determine if a type of bacteria, Group B streptococcus, is present. Your caregiver will explain this further. Your caregiver may ask you:  What your birth plan is.  How you are feeling.  If you are feeling the baby move.  If you have had any abnormal symptoms, such as leaking fluid, bleeding, severe headaches, or abdominal cramping.  If you have any questions. Other tests or screenings that may be performed during your third trimester include:  Blood tests that check for low iron levels (anemia).  Fetal testing to check the health, activity level, and growth of the fetus. Testing is done if you have certain medical conditions or if there are problems during the pregnancy. FALSE LABOR You may feel small, irregular contractions that   eventually go away. These are called Braxton Hicks contractions, or false labor. Contractions may last for hours, days, or even weeks before true labor sets in. If contractions come at regular intervals, intensify, or become painful, it is best to be seen by your caregiver.  SIGNS OF LABOR   Menstrual-like cramps.  Contractions that are 5 minutes apart or less.  Contractions that start on the top of the uterus and spread down to the lower abdomen and back.  A sense of increased pelvic  pressure or back pain.  A watery or bloody mucus discharge that comes from the vagina. If you have any of these signs before the 37th week of pregnancy, call your caregiver right away. You need to go to the hospital to get checked immediately. HOME CARE INSTRUCTIONS   Avoid all smoking, herbs, alcohol, and unprescribed drugs. These chemicals affect the formation and growth of the baby.  Follow your caregiver's instructions regarding medicine use. There are medicines that are either safe or unsafe to take during pregnancy.  Exercise only as directed by your caregiver. Experiencing uterine cramps is a good sign to stop exercising.  Continue to eat regular, healthy meals.  Wear a good support bra for breast tenderness.  Do not use hot tubs, steam rooms, or saunas.  Wear your seat belt at all times when driving.  Avoid raw meat, uncooked cheese, cat litter boxes, and soil used by cats. These carry germs that can cause birth defects in the baby.  Take your prenatal vitamins.  Try taking a stool softener (if your caregiver approves) if you develop constipation. Eat more high-fiber foods, such as fresh vegetables or fruit and whole grains. Drink plenty of fluids to keep your urine clear or pale yellow.  Take warm sitz baths to soothe any pain or discomfort caused by hemorrhoids. Use hemorrhoid cream if your caregiver approves.  If you develop varicose veins, wear support hose. Elevate your feet for 15 minutes, 3-4 times a day. Limit salt in your diet.  Avoid heavy lifting, wear low heal shoes, and practice good posture.  Rest a lot with your legs elevated if you have leg cramps or low back pain.  Visit your dentist if you have not gone during your pregnancy. Use a soft toothbrush to brush your teeth and be gentle when you floss.  A sexual relationship may be continued unless your caregiver directs you otherwise.  Do not travel far distances unless it is absolutely necessary and only  with the approval of your caregiver.  Take prenatal classes to understand, practice, and ask questions about the labor and delivery.  Make a trial run to the hospital.  Pack your hospital bag.  Prepare the baby's nursery.  Continue to go to all your prenatal visits as directed by your caregiver. SEEK MEDICAL CARE IF:  You are unsure if you are in labor or if your water has broken.  You have dizziness.  You have mild pelvic cramps, pelvic pressure, or nagging pain in your abdominal area.  You have persistent nausea, vomiting, or diarrhea.  You have a bad smelling vaginal discharge.  You have pain with urination. SEEK IMMEDIATE MEDICAL CARE IF:   You have a fever.  You are leaking fluid from your vagina.  You have spotting or bleeding from your vagina.  You have severe abdominal cramping or pain.  You have rapid weight loss or gain.  You have shortness of breath with chest pain.  You notice sudden or extreme swelling   of your face, hands, ankles, feet, or legs.  You have not felt your baby move in over an hour.  You have severe headaches that do not go away with medicine.  You have vision changes. Document Released: 02/03/2001 Document Revised: 02/14/2013 Document Reviewed: 04/12/2012 [employer] Patient Information 2015 [employer]. This information is not intended to replace advice given to you by your health care provider. Make sure you discuss any questions you have with your health care provider.  Breastfeeding Deciding to breastfeed is one of the best choices you can make for you and your baby. A change in hormones during pregnancy causes your breast tissue to grow and increases the number and size of your milk ducts. These hormones also allow proteins, sugars, and fats from your blood supply to make breast milk in your milk-producing glands. Hormones prevent breast milk from being released before your baby is born as well as prompt milk flow after birth. Once  breastfeeding has begun, thoughts of your baby, as well as his or her sucking or crying, can stimulate the release of milk from your milk-producing glands.  BENEFITS OF BREASTFEEDING For Your Baby  Your first milk (colostrum) helps your baby's digestive system function better.   There are antibodies in your milk that help your baby fight off infections.   Your baby has a lower incidence of asthma, allergies, and sudden infant death syndrome.   The nutrients in breast milk are better for your baby than infant formulas and are designed uniquely for your baby's needs.   Breast milk improves your baby's brain development.   Your baby is less likely to develop other conditions, such as childhood obesity, asthma, or type 2 diabetes mellitus.  For You   Breastfeeding helps to create a very special bond between you and your baby.   Breastfeeding is convenient. Breast milk is always available at the correct temperature and costs nothing.   Breastfeeding helps to burn calories and helps you lose the weight gained during pregnancy.   Breastfeeding makes your uterus contract to its prepregnancy size faster and slows bleeding (lochia) after you give birth.   Breastfeeding helps to lower your risk of developing type 2 diabetes mellitus, osteoporosis, and breast or ovarian cancer later in life. SIGNS THAT YOUR BABY IS HUNGRY Early Signs of Hunger  Increased alertness or activity.  Stretching.  Movement of the head from side to side.  Movement of the head and opening of the mouth when the corner of the mouth or cheek is stroked (rooting).  Increased sucking sounds, smacking lips, cooing, sighing, or squeaking.  Hand-to-mouth movements.  Increased sucking of fingers or hands. Late Signs of Hunger  Fussing.  Intermittent crying. Extreme Signs of Hunger Signs of extreme hunger will require calming and consoling before your baby will be able to breastfeed successfully. Do not  wait for the following signs of extreme hunger to occur before you initiate breastfeeding:   Restlessness.  A loud, strong cry.   Screaming. BREASTFEEDING BASICS Breastfeeding Initiation  Find a comfortable place to sit or lie down, with your neck and back well supported.  Place a pillow or rolled up blanket under your baby to bring him or her to the level of your breast (if you are seated). Nursing pillows are specially designed to help support your arms and your baby while you breastfeed.  Make sure that your baby's abdomen is facing your abdomen.   Gently massage your breast. With your fingertips, massage from your chest   wall toward your nipple in a circular motion. This encourages milk flow. You may need to continue this action during the feeding if your milk flows slowly.  Support your breast with 4 fingers underneath and your thumb above your nipple. Make sure your fingers are well away from your nipple and your baby's mouth.   Stroke your baby's lips gently with your finger or nipple.   When your baby's mouth is open wide enough, quickly bring your baby to your breast, placing your entire nipple and as much of the colored area around your nipple (areola) as possible into your baby's mouth.   More areola should be visible above your baby's upper lip than below the lower lip.   Your baby's tongue should be between his or her lower gum and your breast.   Ensure that your baby's mouth is correctly positioned around your nipple (latched). Your baby's lips should create a seal on your breast and be turned out (everted).  It is common for your baby to suck about 2-3 minutes in order to start the flow of breast milk. Latching Teaching your baby how to latch on to your breast properly is very important. An improper latch can cause nipple pain and decreased milk supply for you and poor weight gain in your baby. Also, if your baby is not latched onto your nipple properly, he or she  may swallow some air during feeding. This can make your baby fussy. Burping your baby when you switch breasts during the feeding can help to get rid of the air. However, teaching your baby to latch on properly is still the best way to prevent fussiness from swallowing air while breastfeeding. Signs that your baby has successfully latched on to your nipple:    Silent tugging or silent sucking, without causing you pain.   Swallowing heard between every 3-4 sucks.    Muscle movement above and in front of his or her ears while sucking.  Signs that your baby has not successfully latched on to nipple:   Sucking sounds or smacking sounds from your baby while breastfeeding.  Nipple pain. If you think your baby has not latched on correctly, slip your finger into the corner of your baby's mouth to break the suction and place it between your baby's gums. Attempt breastfeeding initiation again. Signs of Successful Breastfeeding Signs from your baby:   A gradual decrease in the number of sucks or complete cessation of sucking.   Falling asleep.   Relaxation of his or her body.   Retention of a small amount of milk in his or her mouth.   Letting go of your breast by himself or herself. Signs from you:  Breasts that have increased in firmness, weight, and size 1-3 hours after feeding.   Breasts that are softer immediately after breastfeeding.  Increased milk volume, as well as a change in milk consistency and color by the fifth day of breastfeeding.   Nipples that are not sore, cracked, or bleeding. Signs That Your Baby is Getting Enough Milk  Wetting at least 3 diapers in a 24-hour period. The urine should be clear and pale yellow by age 5 days.  At least 3 stools in a 24-hour period by age 5 days. The stool should be soft and yellow.  At least 3 stools in a 24-hour period by age 7 days. The stool should be seedy and yellow.  No loss of weight greater than 10% of birth weight  during the first 3   days of age.  Average weight gain of 4-7 ounces (113-198 g) per week after age 4 days.  Consistent daily weight gain by age 5 days, without weight loss after the age of 2 weeks. After a feeding, your baby may spit up a small amount. This is common. BREASTFEEDING FREQUENCY AND DURATION Frequent feeding will help you make more milk and can prevent sore nipples and breast engorgement. Breastfeed when you feel the need to reduce the fullness of your breasts or when your baby shows signs of hunger. This is called "breastfeeding on demand." Avoid introducing a pacifier to your baby while you are working to establish breastfeeding (the first 4-6 weeks after your baby is born). After this time you may choose to use a pacifier. Research has shown that pacifier use during the first year of a baby's life decreases the risk of sudden infant death syndrome (SIDS). Allow your baby to feed on each breast as long as he or she wants. Breastfeed until your baby is finished feeding. When your baby unlatches or falls asleep while feeding from the first breast, offer the second breast. Because newborns are often sleepy in the first few weeks of life, you may need to awaken your baby to get him or her to feed. Breastfeeding times will vary from baby to baby. However, the following rules can serve as a guide to help you ensure that your baby is properly fed:  Newborns (babies 4 weeks of age or younger) may breastfeed every 1-3 hours.  Newborns should not go longer than 3 hours during the day or 5 hours during the night without breastfeeding.  You should breastfeed your baby a minimum of 8 times in a 24-hour period until you begin to introduce solid foods to your baby at around 6 months of age. BREAST MILK PUMPING Pumping and storing breast milk allows you to ensure that your baby is exclusively fed your breast milk, even at times when you are unable to breastfeed. This is especially important if you are  going back to work while you are still breastfeeding or when you are not able to be present during feedings. Your lactation consultant can give you guidelines on how long it is safe to store breast milk.  A breast pump is a machine that allows you to pump milk from your breast into a sterile bottle. The pumped breast milk can then be stored in a refrigerator or freezer. Some breast pumps are operated by hand, while others use electricity. Ask your lactation consultant which type will work best for you. Breast pumps can be purchased, but some hospitals and breastfeeding support groups lease breast pumps on a monthly basis. A lactation consultant can teach you how to hand express breast milk, if you prefer not to use a pump.  CARING FOR YOUR BREASTS WHILE YOU BREASTFEED Nipples can become dry, cracked, and sore while breastfeeding. The following recommendations can help keep your breasts moisturized and healthy:  Avoid using soap on your nipples.   Wear a supportive bra. Although not required, special nursing bras and tank tops are designed to allow access to your breasts for breastfeeding without taking off your entire bra or top. Avoid wearing underwire-style bras or extremely tight bras.  Air dry your nipples for 3-4minutes after each feeding.   Use only cotton bra pads to absorb leaked breast milk. Leaking of breast milk between feedings is normal.   Use lanolin on your nipples after breastfeeding. Lanolin helps to maintain your skin's   normal moisture barrier. If you use pure lanolin, you do not need to wash it off before feeding your baby again. Pure lanolin is not toxic to your baby. You may also hand express a few drops of breast milk and gently massage that milk into your nipples and allow the milk to air dry. In the first few weeks after giving birth, some women experience extremely full breasts (engorgement). Engorgement can make your breasts feel heavy, warm, and tender to the touch.  Engorgement peaks within 3-5 days after you give birth. The following recommendations can help ease engorgement:  Completely empty your breasts while breastfeeding or pumping. You may want to start by applying warm, moist heat (in the shower or with warm water-soaked hand towels) just before feeding or pumping. This increases circulation and helps the milk flow. If your baby does not completely empty your breasts while breastfeeding, pump any extra milk after he or she is finished.  Wear a snug bra (nursing or regular) or tank top for 1-2 days to signal your body to slightly decrease milk production.  Apply ice packs to your breasts, unless this is too uncomfortable for you.  Make sure that your baby is latched on and positioned properly while breastfeeding. If engorgement persists after 48 hours of following these recommendations, contact your health care provider or a lactation consultant. OVERALL HEALTH CARE RECOMMENDATIONS WHILE BREASTFEEDING  Eat healthy foods. Alternate between meals and snacks, eating 3 of each per day. Because what you eat affects your breast milk, some of the foods may make your baby more irritable than usual. Avoid eating these foods if you are sure that they are negatively affecting your baby.  Drink milk, fruit juice, and water to satisfy your thirst (about 10 glasses a day).   Rest often, relax, and continue to take your prenatal vitamins to prevent fatigue, stress, and anemia.  Continue breast self-awareness checks.  Avoid chewing and smoking tobacco.  Avoid alcohol and drug use. Some medicines that may be harmful to your baby can pass through breast milk. It is important to ask your health care provider before taking any medicine, including all over-the-counter and prescription medicine as well as vitamin and herbal supplements. It is possible to become pregnant while breastfeeding. If birth control is desired, ask your health care provider about options that  will be safe for your baby. SEEK MEDICAL CARE IF:   You feel like you want to stop breastfeeding or have become frustrated with breastfeeding.  You have painful breasts or nipples.  Your nipples are cracked or bleeding.  Your breasts are red, tender, or warm.  You have a swollen area on either breast.  You have a fever or chills.  You have nausea or vomiting.  You have drainage other than breast milk from your nipples.  Your breasts do not become full before feedings by the fifth day after you give birth.  You feel sad and depressed.  Your baby is too sleepy to eat well.  Your baby is having trouble sleeping.   Your baby is wetting less than 3 diapers in a 24-hour period.  Your baby has less than 3 stools in a 24-hour period.  Your baby's skin or the white part of his or her eyes becomes yellow.   Your baby is not gaining weight by 5 days of age. SEEK IMMEDIATE MEDICAL CARE IF:   Your baby is overly tired (lethargic) and does not want to wake up and feed.  Your baby   develops an unexplained fever. Document Released: 02/09/2005 Document Revised: 02/14/2013 Document Reviewed: 08/03/2012 [employer] Patient Information 2015 [employer]. This information is not intended to replace advice given to you by your health care provider. Make sure you discuss any questions you have with your health care provider.  

## 2013-12-16 ENCOUNTER — Inpatient Hospital Stay (HOSPITAL_COMMUNITY): Payer: 59 | Admitting: Anesthesiology

## 2013-12-16 ENCOUNTER — Encounter (HOSPITAL_COMMUNITY): Payer: 59 | Admitting: Anesthesiology

## 2013-12-16 ENCOUNTER — Encounter (HOSPITAL_COMMUNITY): Payer: Self-pay | Admitting: *Deleted

## 2013-12-16 ENCOUNTER — Inpatient Hospital Stay (HOSPITAL_COMMUNITY)
Admission: AD | Admit: 2013-12-16 | Discharge: 2013-12-18 | DRG: 775 | Disposition: A | Discharge status: Home / Self Care | Payer: 59 | Source: Ambulatory Visit | Attending: Obstetrics & Gynecology | Admitting: Obstetrics & Gynecology

## 2013-12-16 DIAGNOSIS — Z3A40 40 weeks gestation of pregnancy: Secondary | ICD-10-CM | POA: Diagnosis present

## 2013-12-16 DIAGNOSIS — Z8249 Family history of ischemic heart disease and other diseases of the circulatory system: Secondary | ICD-10-CM | POA: Diagnosis not present

## 2013-12-16 DIAGNOSIS — O99824 Streptococcus B carrier state complicating childbirth: Secondary | ICD-10-CM | POA: Diagnosis present

## 2013-12-16 DIAGNOSIS — IMO0001 Reserved for inherently not codable concepts without codable children: Secondary | ICD-10-CM

## 2013-12-16 DIAGNOSIS — O471 False labor at or after 37 completed weeks of gestation: Secondary | ICD-10-CM | POA: Diagnosis present

## 2013-12-16 LAB — TYPE AND SCREEN
ABO/RH(D): A POS
ANTIBODY SCREEN: NEGATIVE

## 2013-12-16 LAB — CBC
HEMATOCRIT: 35.9 % — AB (ref 36.0–46.0)
HEMOGLOBIN: 12.5 g/dL (ref 12.0–15.0)
MCH: 33.9 pg (ref 26.0–34.0)
MCHC: 34.8 g/dL (ref 30.0–36.0)
MCV: 97.3 fL (ref 78.0–100.0)
Platelets: 140 10*3/uL — ABNORMAL LOW (ref 150–400)
RBC: 3.69 MIL/uL — ABNORMAL LOW (ref 3.87–5.11)
RDW: 13 % (ref 11.5–15.5)
WBC: 13.3 10*3/uL — ABNORMAL HIGH (ref 4.0–10.5)

## 2013-12-16 LAB — RPR

## 2013-12-16 MED ORDER — CITRIC ACID-SODIUM CITRATE 334-500 MG/5ML PO SOLN: 30.0000 mL | ORAL | Status: DC | PRN

## 2013-12-16 MED ORDER — OXYTOCIN 40 UNITS IN LACTATED RINGERS INFUSION - SIMPLE MED
62.5000 mL/h | INTRAVENOUS | Status: DC
  Filled 2013-12-16: qty 1000

## 2013-12-16 MED ORDER — OXYCODONE-ACETAMINOPHEN 5-325 MG PO TABS: 1.0000 | ORAL_TABLET | ORAL | Status: DC | PRN

## 2013-12-16 MED ORDER — LACTATED RINGERS IV SOLN: 500.0000 mL | INTRAVENOUS | Status: DC | PRN

## 2013-12-16 MED ORDER — LIDOCAINE HCL (PF) 1 % IJ SOLN
30.0000 mL | INTRAMUSCULAR | Status: DC | PRN
  Filled 2013-12-16: qty 30

## 2013-12-16 MED ORDER — LIDOCAINE HCL (PF) 1 % IJ SOLN
INTRAMUSCULAR | Status: DC | PRN
  Administered 2013-12-16 (×2): 5 mL

## 2013-12-16 MED ORDER — PHENYLEPHRINE 40 MCG/ML (10ML) SYRINGE FOR IV PUSH (FOR BLOOD PRESSURE SUPPORT)
80.0000 ug | PREFILLED_SYRINGE | INTRAVENOUS | Status: DC | PRN
  Filled 2013-12-16: qty 2

## 2013-12-16 MED ORDER — ACETAMINOPHEN 325 MG PO TABS: 650.0000 mg | ORAL_TABLET | ORAL | Status: DC | PRN

## 2013-12-16 MED ORDER — OXYCODONE-ACETAMINOPHEN 5-325 MG PO TABS: 2.0000 | ORAL_TABLET | ORAL | Status: DC | PRN

## 2013-12-16 MED ORDER — PENICILLIN G POTASSIUM 5000000 UNITS IJ SOLR
5.0000 10*6.[IU] | Freq: Once | INTRAVENOUS | Status: AC
  Administered 2013-12-16: 5 10*6.[IU] via INTRAVENOUS
  Filled 2013-12-16: qty 5

## 2013-12-16 MED ORDER — FLEET ENEMA 7-19 GM/118ML RE ENEM: 1.0000 | ENEMA | RECTAL | Status: DC | PRN

## 2013-12-16 MED ORDER — PENICILLIN G POTASSIUM 5000000 UNITS IJ SOLR
2.5000 10*6.[IU] | INTRAVENOUS | Status: DC
  Administered 2013-12-16 (×2): 2.5 10*6.[IU] via INTRAVENOUS
  Filled 2013-12-16 (×6): qty 2.5

## 2013-12-16 MED ORDER — ONDANSETRON HCL 4 MG/2ML IJ SOLN
4.0000 mg | Freq: Four times a day (QID) | INTRAMUSCULAR | Status: DC | PRN
Start: 2013-12-16 — End: 2013-12-17
  Administered 2013-12-16: 4 mg via INTRAVENOUS
  Filled 2013-12-16: qty 2

## 2013-12-16 MED ORDER — DIPHENHYDRAMINE HCL 50 MG/ML IJ SOLN: 12.5000 mg | INTRAMUSCULAR | Status: DC | PRN

## 2013-12-16 MED ORDER — FENTANYL CITRATE 0.05 MG/ML IJ SOLN: 100.0000 ug | INTRAMUSCULAR | Status: DC | PRN

## 2013-12-16 MED ORDER — OXYTOCIN BOLUS FROM INFUSION
500.0000 mL | INTRAVENOUS | Status: DC
  Administered 2013-12-16: 500 mL via INTRAVENOUS

## 2013-12-16 MED ORDER — PHENYLEPHRINE 40 MCG/ML (10ML) SYRINGE FOR IV PUSH (FOR BLOOD PRESSURE SUPPORT)
80.0000 ug | PREFILLED_SYRINGE | INTRAVENOUS | Status: DC | PRN
  Filled 2013-12-16: qty 10
  Filled 2013-12-16: qty 2

## 2013-12-16 MED ORDER — FENTANYL 2.5 MCG/ML BUPIVACAINE 1/10 % EPIDURAL INFUSION (WH - ANES)
14.0000 mL/h | INTRAMUSCULAR | Status: DC | PRN
Start: 2013-12-16 — End: 2013-12-17
  Administered 2013-12-16 (×3): 14 mL/h via EPIDURAL
  Filled 2013-12-16 (×2): qty 125

## 2013-12-16 MED ORDER — LACTATED RINGERS IV SOLN: 500.0000 mL | Freq: Once | INTRAVENOUS | Status: DC

## 2013-12-16 MED ORDER — EPHEDRINE 5 MG/ML INJ
10.0000 mg | INTRAVENOUS | Status: DC | PRN
  Filled 2013-12-16: qty 2

## 2013-12-16 MED ORDER — LACTATED RINGERS IV SOLN
INTRAVENOUS | Status: DC
  Administered 2013-12-16 (×2): via INTRAVENOUS

## 2013-12-16 NOTE — H&P (Signed)
[name] is a 33 y.o. G26P1001 female at [redacted]w[redacted]d by LMP c/w 7wk u/s presenting in early labor w/ SROM in MAU.   Reports active fetal movement, contractions: regular, vaginal bleeding: none, membranes: ruptured, clear fluid. Initiated prenatal care at [facility] at 6 wks.   Pregnancy complicated by GBS+ urine, basal cell skin cancer.   H/O 1st degree uterine prolapse 2013  Past Medical History: Past Medical History  Diagnosis Date  . Hyperlipidemia   . Celiac disease   . Uterine prolapse   . Basal cell carcinoma of nasolabial crease 2014    Past Surgical History: Past Surgical History  Procedure Laterality Date  . Tonsillectomy and adenoidectomy  1995  . Skin cancer excision      Obstetrical History: OB History   Grav Para Term Preterm Abortions TAB SAB Ect Mult Living   2 1 1       1       Social History: History   Social History  . Marital Status: Married    Spouse Name: N/A    Number of Children: N/A  . Years of Education: N/A   Social History Main Topics  . Smoking status: Never Smoker   . Smokeless tobacco: Never Used  . Alcohol Use: Yes     Comment: occassionally  . Drug Use: No  . Sexual Activity: Yes    Partners: Male    Birth Control/ Protection: None   Other Topics Concern  . 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   Recently moved here from [facility].   Married, 1 daughter named [name] (born 2011).    Family History: Family History  Problem Relation Age of Onset  . Hypothyroidism Mother   . Hyperlipidemia Father   . Celiac disease Sister   . Hashimoto's thyroiditis Sister   . Heart disease Paternal Grandfather 54    multiple CABG  . Hyperlipidemia Paternal Grandfather   . Cancer Paternal Grandmother     pancreatic cancer    Allergies: No Known Allergies  Prescriptions prior to admission  Medication Sig Dispense Refill  . calcium carbonate (TUMS - DOSED IN MG ELEMENTAL CALCIUM) 500 MG chewable tablet  Chew 2 tablets by mouth 2 (two) times daily as needed for indigestion or heartburn.      . Prenatal Multivit-Min-Fe-FA (PRENATAL VITAMINS) 0.8 MG tablet Take 1 tablet by mouth daily.  30 tablet  12     Review of Systems  Pertinent pos/neg as indicated in HPI    Blood pressure 128/68, pulse 78, temperature 98.4 F (36.9 C), temperature source Oral, resp. rate 18, weight 78.472 kg (173 lb), last menstrual period 03/08/2013. General appearance: alert, cooperative and no distress Lungs: clear to auscultation bilaterally Heart: regular rate and rhythm Abdomen: gravid, soft, non-tender Extremities: trace edema DTR's 2+  Fetal monitoring: FHR: 150 bpm, variability: moderate,  Accelerations: Present,  decelerations:  Absent Uterine activity: regular  Dilation: 3 Effacement (%): 80 Station: -2 Exam by:: Devoria Glassing RN Presentation: cephalic   Prenatal labs: ABO, Rh: A/POS/-- (02/25 1352) Antibody: NEG (02/25 1352) Rubella:   RPR: NON REAC (07/24 0920)  HBsAg: NEGATIVE (02/25 1352)  HIV: NONREACTIVE (07/24 0920)  GBS:   Pos urine  1 hr Glucola: 96 Genetic screening:  declined Anatomy US: normal  Results for orders placed during the hospital encounter of 12/16/13 (from the past 24 hour(s))  CBC   Collection Time    12/16/13 12:55 PM  Result Value Ref Range   WBC 13.3 (*) 4.0 - 10.5 K/uL   RBC 3.69 (*) 3.87 - 5.11 MIL/uL   Hemoglobin 12.5  12.0 - 15.0 g/dL   HCT 35.9 (*) 36.0 - 46.0 %   MCV 97.3  78.0 - 100.0 fL   MCH 33.9  26.0 - 34.0 pg   MCHC 34.8  30.0 - 36.0 g/dL   RDW 13.0  11.5 - 15.5 %   Platelets 140 (*) 150 - 400 K/uL     Assessment:  [redacted]w[redacted]d SIUP  G2P1001  Early labor w/ SROM  Cat 1 FHR  GBS  Pos urine  Plan:  Admit to BS  IV pain meds/epidural prn active labor  Expectant management  Anticipate NSVB   Plans to bottlefeed  Contraception: BTL if c/s, interim BTL w/ hernia repair if SVB  Circumcision: n/a  Tawnya Crook CNM,  WHNP-BC 12/16/2013, 1:29 PM

## 2013-12-16 NOTE — Anesthesia Procedure Notes (Signed)
Epidural Patient location during procedure: OB Start time: 12/16/2013 2:02 PM  Staffing Anesthesiologist: Rudean Curt Performed by: anesthesiologist   Preanesthetic Checklist Completed: patient identified, site marked, surgical consent, pre-op evaluation, timeout performed, IV checked, risks and benefits discussed and monitors and equipment checked  Epidural Patient position: sitting Prep: site prepped and draped and DuraPrep Patient monitoring: continuous pulse ox and blood pressure Approach: midline Location: L3-L4 Injection technique: LOR air  Needle:  Needle type: Tuohy  Needle gauge: 17 G Needle length: 9 cm and 9 Needle insertion depth: 6 cm Catheter type: closed end flexible Catheter size: 19 Gauge Catheter at skin depth: 11 cm Test dose: negative  Assessment Events: blood not aspirated, injection not painful, no injection resistance, negative IV test and no paresthesia  Additional Notes Patient identified.  Risk benefits discussed including failed block, incomplete pain control, headache, nerve damage, paralysis, blood pressure changes, nausea, vomiting, reactions to medication both toxic or allergic, and postpartum back pain.  Patient expressed understanding and wished to proceed.  All questions were answered.  Sterile technique used throughout procedure and epidural site dressed with sterile barrier dressing. No paresthesia or other complications noted.The patient did not experience any signs of intravascular injection such as tinnitus or metallic taste in mouth nor signs of intrathecal spread such as rapid motor block. Please see nursing notes for vital signs.

## 2013-12-16 NOTE — MAU Note (Signed)
C/o stronger ucs @ 0930; denies SROM; c/o small vaginal bleeding since 0930; pain rated 7;

## 2013-12-16 NOTE — Anesthesia Preprocedure Evaluation (Signed)

## 2013-12-16 NOTE — Progress Notes (Signed)
Patient ID: [name], female   DOB: [birth year], 33 y.o.   MRN: [id number] [name] is a 33 y.o. G2P1001 at [redacted]w[redacted]d admitted for rupture of membranes, early labor  Subjective: Worried about having big baby, otherwise no complaints.   Objective: BP 96/59  Pulse 84  Temp(Src) 98.4 F (36.9 C) (Oral)  Resp 18  Ht 5\' 1"  (1.549 m)  Wt 78.472 kg (173 lb)  BMI 32.70 kg/m2  SpO2 99%  LMP 03/08/2013 Total I/O In: -  Out: 300 [Urine:300]  FHT:  FHR: 130 bpm, variability: moderate,  accelerations:  Present,  decelerations:  Absent UC:   regular, every 2-5 minutes  SVE:   Dilation: 8.5 Effacement (%): 100 Station: 0 Exam by:: Veronica Mensah  Labs: Lab Results  Component Value Date   WBC 13.3* 12/16/2013   HGB 12.5 12/16/2013   HCT 35.9* 12/16/2013   MCV 97.3 12/16/2013   PLT 140* 12/16/2013    Assessment / Plan: Spontaneous labor, progressing normally  Labor: Progressing normally Fetal Wellbeing:  Category I Pain Control:  Epidural Pre-eclampsia: n/a I/D:  PCN for gbs+ Anticipated MOD:  NSVD  [name]  12/16/2013, 10:26 PM

## 2013-12-16 NOTE — MAU Note (Signed)
Pt states was 1cm in office, had membranes swept. No gush of fluid. Regular ctx's q3-5 minutes apart since 0630 this am.

## 2013-12-16 NOTE — Progress Notes (Signed)
Patient ID: SAAMIYA JEPPSEN, female   DOB: January 14, 1981, 33 y.o.   MRN: [id number] MYESHA STILLION is a 33 y.o. G2P1001 at [redacted]w[redacted]d admitted for rupture of membranes, early labor  Subjective: Doing well, no complaints  Objective: BP 126/77  Pulse 72  Temp(Src) 98 F (36.7 C) (Oral)  Resp 18  Ht 5\' 1"  (1.549 m)  Wt 78.472 kg (173 lb)  BMI 32.70 kg/m2  SpO2 99%  LMP 03/08/2013    FHT:  FHR: 135 bpm, variability: moderate,  accelerations:  Present,  decelerations:  Absent UC:   regular, every 2-5 minutes  SVE:   Dilation: 6 Effacement (%): 90 Station: -2;-1 Exam by:: [email or web address],rnc-ob  Labs: Lab Results  Component Value Date   WBC 13.3* 12/16/2013   HGB 12.5 12/16/2013   HCT 35.9* 12/16/2013   MCV 97.3 12/16/2013   PLT 140* 12/16/2013    Assessment / Plan: Spontaneous labor, progressing normally  Labor: Progressing normally Fetal Wellbeing:  Category I Pain Control:  Epidural Pre-eclampsia: n/a I/D:  PCN for gbs+ Anticipated MOD:  NSVD  Tawnya Crook CNM, WHNP-BC 12/16/2013, 7:10 PM

## 2013-12-17 ENCOUNTER — Encounter (HOSPITAL_COMMUNITY): Payer: Self-pay | Admitting: General Practice

## 2013-12-17 DIAGNOSIS — O4292 Full-term premature rupture of membranes, unspecified as to length of time between rupture and onset of labor: Secondary | ICD-10-CM

## 2013-12-17 LAB — ABO/RH: ABO/RH(D): A POS

## 2013-12-17 MED ORDER — PRENATAL MULTIVITAMIN CH
1.0000 | ORAL_TABLET | Freq: Every day | ORAL | Status: DC
  Administered 2013-12-17 – 2013-12-18 (×2): 1 via ORAL
  Filled 2013-12-17 (×2): qty 1

## 2013-12-17 MED ORDER — TETANUS-DIPHTH-ACELL PERTUSSIS 5-2.5-18.5 LF-MCG/0.5 IM SUSP: 0.5000 mL | Freq: Once | INTRAMUSCULAR | Status: DC

## 2013-12-17 MED ORDER — OXYCODONE-ACETAMINOPHEN 5-325 MG PO TABS: 1.0000 | ORAL_TABLET | ORAL | Status: DC | PRN

## 2013-12-17 MED ORDER — LANOLIN HYDROUS EX OINT: TOPICAL_OINTMENT | CUTANEOUS | Status: DC | PRN

## 2013-12-17 MED ORDER — WITCH HAZEL-GLYCERIN EX PADS: 1.0000 "application " | MEDICATED_PAD | CUTANEOUS | Status: DC | PRN

## 2013-12-17 MED ORDER — IBUPROFEN 600 MG PO TABS
600.0000 mg | ORAL_TABLET | Freq: Four times a day (QID) | ORAL | Status: DC
  Administered 2013-12-17 – 2013-12-18 (×7): 600 mg via ORAL
  Filled 2013-12-17 (×7): qty 1

## 2013-12-17 MED ORDER — SENNOSIDES-DOCUSATE SODIUM 8.6-50 MG PO TABS
2.0000 | ORAL_TABLET | ORAL | Status: DC
  Administered 2013-12-18: 2 via ORAL
  Filled 2013-12-17: qty 2

## 2013-12-17 MED ORDER — DIBUCAINE 1 % RE OINT: 1.0000 "application " | TOPICAL_OINTMENT | RECTAL | Status: DC | PRN

## 2013-12-17 MED ORDER — ONDANSETRON HCL 4 MG/2ML IJ SOLN: 4.0000 mg | INTRAMUSCULAR | Status: DC | PRN

## 2013-12-17 MED ORDER — BENZOCAINE-MENTHOL 20-0.5 % EX AERO
1.0000 "application " | INHALATION_SPRAY | CUTANEOUS | Status: DC | PRN
  Filled 2013-12-17: qty 56

## 2013-12-17 MED ORDER — ONDANSETRON HCL 4 MG PO TABS: 4.0000 mg | ORAL_TABLET | ORAL | Status: DC | PRN

## 2013-12-17 MED ORDER — ZOLPIDEM TARTRATE 5 MG PO TABS
5.0000 mg | ORAL_TABLET | Freq: Every evening | ORAL | Status: DC | PRN
Start: 2013-12-17 — End: 2013-12-19

## 2013-12-17 MED ORDER — OXYCODONE-ACETAMINOPHEN 5-325 MG PO TABS: 2.0000 | ORAL_TABLET | ORAL | Status: DC | PRN

## 2013-12-17 MED ORDER — DIPHENHYDRAMINE HCL 25 MG PO CAPS: 25.0000 mg | ORAL_CAPSULE | Freq: Four times a day (QID) | ORAL | Status: DC | PRN

## 2013-12-17 MED ORDER — SIMETHICONE 80 MG PO CHEW: 80.0000 mg | CHEWABLE_TABLET | ORAL | Status: DC | PRN

## 2013-12-17 NOTE — Progress Notes (Signed)
Post Partum Day 1 Subjective: Eating, drinking, voiding, ambulating well.  +flatus.  Lochia and pain wnl.  Denies dizziness, lightheadedness, or sob. No complaints.   Objective: Blood pressure 104/53, pulse 51, temperature 98 F (36.7 C), temperature source Oral, resp. rate 18, height 5\' 1"  (1.549 m), weight 78.472 kg (173 lb), last menstrual period 03/08/2013, SpO2 99.00%, unknown if currently breastfeeding.  Physical Exam:  General: alert, cooperative and no distress Lochia: appropriate Uterine Fundus: firm Incision: n/a DVT Evaluation: No evidence of DVT seen on physical exam. Negative Homan's sign. No cords or calf tenderness. No significant calf/ankle edema.   Recent Labs  12/16/13 1255  HGB 12.5  HCT 35.9*    Assessment/Plan: Plan for discharge tomorrow and Contraception interval BTL w/ umbilical hernia repair   LOS: 1 day   [name] 12/17/2013, 7:05 AM

## 2013-12-17 NOTE — Anesthesia Postprocedure Evaluation (Signed)
Anesthesia Post Note  Patient: [name]  Procedure(s) Performed: * No procedures listed *  Anesthesia type: Epidural  Patient location: Mother/Baby  Post pain: Pain level controlled  Post assessment: Post-op Vital signs reviewed  Last Vitals:  Filed Vitals:   12/17/13 0640  BP: 104/53  Pulse: 51  Temp: 36.7 C  Resp: 18    Post vital signs: Reviewed  Level of consciousness:alert  Complications: No apparent anesthesia complications

## 2013-12-18 MED ORDER — LIDOCAINE HCL (PF) 1 % IJ SOLN
30.0000 mL | Freq: Once | INTRAMUSCULAR | Status: AC
  Administered 2013-12-18: 10 mL via INTRADERMAL
  Filled 2013-12-18: qty 30

## 2013-12-18 NOTE — Discharge Instructions (Signed)

## 2013-12-18 NOTE — Plan of Care (Signed)
Problem: Discharge Progression Outcomes Goal: Complications resolved/controlled Outcome: Progressing Awaiting revision of perineal laceration repair prior to being discharged home.

## 2013-12-18 NOTE — Discharge Summary (Signed)
  Obstetric Discharge Summary Reason for Admission: rupture of membranes Prenatal Procedures: none Intrapartum Procedures: spontaneous vaginal delivery Postpartum Procedures: none Complications-Operative and Postpartum: 1st degree perineal laceration  Delivery Note At 11:48 PM a viable female was delivered via Vaginal, Spontaneous Delivery (Presentation: Right Occiput Anterior).  APGAR: 8, 9; weight 9 lb 3.4 oz (4179 g).   Placenta status: Intact, Spontaneous.  Cord: 3 vessels with the following complications: None  Anesthesia: Epidural  Episiotomy: None Lacerations: 1st degree Est. Blood Loss (mL): 250  Mom to postpartum.  Baby to Couplet care / Skin to Skin.  [name] Katherine 33/26/2015, [name]     Hospital Course:  Active Problems:   Active labor   Today: No acute events overnight.  Pt denies problems with ambulating, voiding or po intake.  She denies nausea or vomiting.  Pain is well controlled.Plan for birth control is  interval BTL with hernia repair or IUD.  Method of Feeding: bottle  [name] is a 33 y.o. [id number] s/p SVD.  Patient presented to OBT with SROM and was admitted to L&D.  She has postpartum course that was uncomplicated including no problems with ambulating, PO intake, urination, pain, or bleeding. The pt feels ready to go home and  will be discharged with outpatient follow-up.    H/H: Lab Results  Component Value Date/Time   HGB 12.5 12/16/2013 12:55 PM   HCT 35.9* 12/16/2013 12:55 PM    Discharge Diagnoses: Term Pregnancy-delivered  Discharge Information: Date: [name] Activity: pelvic rest Diet: routine  Medications: None Breast feeding:  Yes Condition: stable Instructions: refer to handout Discharge to: home   Discharge Instructions   Call MD for:  severe uncontrolled pain    Complete by:  As directed      Call MD for:  temperature >100.4    Complete by:  As directed      Diet - low sodium heart healthy    Complete by:   As directed             Medication List         calcium carbonate 500 MG chewable tablet  Commonly known as:  TUMS - dosed in mg elemental calcium  Chew 2 tablets by mouth 2 (two) times daily as needed for indigestion or heartburn.     Prenatal Vitamins 0.8 MG tablet  Take 1 tablet by mouth daily.         Merla Riches ,MD OB Fellow [name],[name]

## 2013-12-19 ENCOUNTER — Encounter: Payer: Self-pay | Admitting: Obstetrics & Gynecology

## 2013-12-21 NOTE — Discharge Summary (Signed)
Attestation of Attending Supervision of Advanced Practitioner (CNM/NP): Evaluation and management procedures were performed by the Advanced Practitioner under my supervision and collaboration.  I have reviewed the Advanced Practitioner's note and chart, and I agree with the management and plan.  Lauro Manlove 12/21/2013 10:23 AM

## 2013-12-25 ENCOUNTER — Encounter (HOSPITAL_COMMUNITY): Payer: Self-pay | Admitting: *Deleted

## 2013-12-25 ENCOUNTER — Inpatient Hospital Stay (HOSPITAL_COMMUNITY)
Admission: AD | Admit: 2013-12-25 | Discharge: 2013-12-25 | Disposition: A | Discharge status: Home / Self Care | Payer: 59 | Source: Ambulatory Visit | Attending: Obstetrics & Gynecology | Admitting: Obstetrics & Gynecology

## 2013-12-25 DIAGNOSIS — O901 Disruption of perineal obstetric wound: Secondary | ICD-10-CM | POA: Diagnosis present

## 2013-12-25 LAB — URINALYSIS, ROUTINE W REFLEX MICROSCOPIC
Bilirubin Urine: NEGATIVE
GLUCOSE, UA: NEGATIVE mg/dL
Ketones, ur: NEGATIVE mg/dL
Nitrite: NEGATIVE
PH: 5.5 (ref 5.0–8.0)
Protein, ur: NEGATIVE mg/dL
SPECIFIC GRAVITY, URINE: 1.01 (ref 1.005–1.030)
Urobilinogen, UA: 0.2 mg/dL (ref 0.0–1.0)

## 2013-12-25 LAB — URINE MICROSCOPIC-ADD ON

## 2013-12-25 MED ORDER — DOXYCYCLINE HYCLATE 100 MG PO CAPS: 100.0000 mg | ORAL_CAPSULE | Freq: Two times a day (BID) | ORAL | Status: DC

## 2013-12-25 NOTE — MAU Note (Signed)
Pt presents with complaints of having brown vaginal discharge stating her vaginal sutures are coming out from her delivery on 12-16-13

## 2013-12-25 NOTE — MAU Provider Note (Signed)
  History     CSN: [id number]  Arrival date and time: 12/25/13 1111   First Provider Initiated Contact with Patient 12/25/13 1306      Chief Complaint  Patient presents with  . Vaginal Discharge   HPI  33 yo female s/p NSVD 1 week ago c/o midline perineal laceration separation.  Pt had a bedside repair the day of discharge.  Pt then began noticing malodorous bloody discharge on Friday.  She states she did not fell worse but feels the area has become further apart.  Pt denies pain, chills, fever, and rigors.    Past Medical History  Diagnosis Date  . Hyperlipidemia   . Celiac disease   . Uterine prolapse 08/2009  . Basal cell carcinoma of nasolabial crease 2014    Past Surgical History  Procedure Laterality Date  . Tonsillectomy and adenoidectomy  1995  . Skin cancer excision      Family History  Problem Relation Age of Onset  . Hypothyroidism Mother   . Hyperlipidemia Father   . Celiac disease Sister   . Hashimoto's thyroiditis Sister   . Heart disease Paternal Grandfather 80    multiple CABG  . Hyperlipidemia Paternal Grandfather   . Cancer Paternal Grandmother     pancreatic cancer    History  Substance Use Topics  . Smoking status: Never Smoker   . Smokeless tobacco: Never Used  . Alcohol Use: Yes     Comment: occassionally    Allergies: No Known Allergies  Prescriptions prior to admission  Medication Sig Dispense Refill Last Dose  . acetaminophen (TYLENOL) 500 MG tablet Take 1,000 mg by mouth every 6 (six) hours as needed for mild pain.   12/24/2013 at Unknown time  . ibuprofen (ADVIL,MOTRIN) 200 MG tablet Take 800 mg by mouth every 6 (six) hours as needed.   12/24/2013 at Unknown time  . Prenatal Multivit-Min-Fe-FA (PRENATAL VITAMINS) 0.8 MG tablet Take 1 tablet by mouth daily. 30 tablet 12 12/24/2013 at Unknown time  . calcium carbonate (TUMS - DOSED IN MG ELEMENTAL CALCIUM) 500 MG chewable tablet Chew 2 tablets by mouth 2 (two) times daily as needed for  indigestion or heartburn.   Past Week at Unknown time    ROS Physical Exam   Blood pressure 136/86, pulse 62, temperature 99 F (37.2 C), resp. rate 16, last menstrual period 03/08/2013, unknown if currently breastfeeding.  Physical Exam  Vitals reviewed. Constitutional: She appears well-developed and well-nourished. No distress.  HENT:  Head: Normocephalic and atraumatic.  Respiratory: Effort normal.  GI: Soft. She exhibits no distension and no mass. There is no tenderness. There is no rebound and no guarding.  Genitourinary:     Uterus is post parutm, slightly enlarged.  Non tender. No CMT.  Mild tenderness in RLQ but no mass felt.  No LLQ tenderness.    MAU Course  Procedures  Assessment and Plan  33 yo female s/p NSVD with separation of Sub Q stitches of midline peroneal laceration with mild infection.  Doxycycline 100 mg bid Recheck on Thursday; can try to re approximate with Monocryl interrupted suture.    LEGGETT,KELLY H. 12/25/2013, 1:16 PM

## 2013-12-28 ENCOUNTER — Encounter: Payer: Self-pay | Admitting: Family Medicine

## 2013-12-28 ENCOUNTER — Ambulatory Visit (INDEPENDENT_AMBULATORY_CARE_PROVIDER_SITE_OTHER): Payer: 59 | Admitting: Family Medicine

## 2013-12-28 NOTE — Progress Notes (Signed)
    Subjective:    Patient ID: [name] is a 33 y.o. female presenting with Follow-up  on 12/28/2013  HPI: 1 wk pp. Had 2 repairs of her incision. Infection.  Now on Doxy. Denies pain. Dark brown discharge improving.  Review of Systems  Constitutional: Negative for fever and chills.  Respiratory: Negative for shortness of breath.   Cardiovascular: Negative for chest pain.  Gastrointestinal: Negative for nausea, vomiting and abdominal pain.  Genitourinary: Negative for dysuria.  Skin: Negative for rash.      Objective:    BP 130/92 mmHg  Pulse 64  Wt 147 lb (66.679 kg)  Breastfeeding? No Physical Exam  Constitutional: She is oriented to person, place, and time. She appears well-developed and well-nourished. No distress.  HENT:  Head: Normocephalic and atraumatic.  Eyes: No scleral icterus.  Neck: Neck supple.  Cardiovascular: Normal rate.   Pulmonary/Chest: Effort normal.  Abdominal: Soft.  Genitourinary:  Suture is visible.  Well approximated. No frank pus.  Neurological: She is alert and oriented to person, place, and time.  Skin: Skin is warm and dry.  Psychiatric: She has a normal mood and affect.        Assessment & Plan:  Healing well Return with bleeding or infection.  Return for pp check.

## 2014-01-08 ENCOUNTER — Encounter: Payer: Self-pay | Admitting: Obstetrics & Gynecology

## 2014-01-08 ENCOUNTER — Ambulatory Visit (INDEPENDENT_AMBULATORY_CARE_PROVIDER_SITE_OTHER): Payer: 59 | Admitting: Obstetrics & Gynecology

## 2014-01-08 DIAGNOSIS — IMO0001 Reserved for inherently not codable concepts without codable children: Secondary | ICD-10-CM

## 2014-01-08 DIAGNOSIS — IMO0002 Reserved for concepts with insufficient information to code with codable children: Secondary | ICD-10-CM

## 2014-01-08 DIAGNOSIS — N8110 Cystocele, unspecified: Secondary | ICD-10-CM

## 2014-01-08 NOTE — Patient Instructions (Signed)
Return to clinic for any obstetric concerns or go to [facility] for evaluation  

## 2014-01-08 NOTE — Progress Notes (Signed)
Patient feels like her incision is tearing even more, she feels like she can see her bladder and that this is putting pressure on the incision, she is taking it easy as far as doing anything strenuous.  At this point she really would just like it fixed even if that means going back to the ER.  She works in the [employer] and feels like she can not go back to work and stand up like she need to be able to do.

## 2014-01-08 NOTE — Progress Notes (Signed)
   CLINIC ENCOUNTER NOTE  History:  33 y.o. [id number] here today for follow up evaluation of her 1st degree perineal laceration s/p SVD on 12/17/13.  Note by Vivia Ewing, CMA: Patient feels like her incision is tearing even more, she feels like she can see her bladder and that this is putting pressure on the incision, she is taking it easy as far as doing anything strenuous. At this point she really would just like it fixed even if that means going back to the ER. She works in the [employer] and feels like she can not go back to work and stand up like she need to be able to do.     The following portions of the patient's history were reviewed and updated as appropriate: allergies, current medications, past family history, past medical history, past social history, past surgical history and problem list. Normal pap and negative HRHPV on 02/07/13.    Review of Systems:  Pertinent items are noted in HPI.  Objective:  Physical Exam BP 135/93 mmHg  Pulse 58  Ht 5\' 2"  (1.575 m)  Wt 145 lb (65.772 kg)  BMI 26.51 kg/m2  Breastfeeding? No Gen: NAD Abd: Soft, nontender and nondistended Pelvic: Normal appearing external genitalia;weel-healing first degree laceration.  Air knot sutures seen, these were excised. No erythema.  Grade 2 cystocele appreciated with Valsalva; bulge presses on perineal repair.  Moderate lochia noted.  Pessary Placement Patient desires surgical correction of pessary and will meet with Dr. Hulan Fray to discuss options.  In the meantime, offered pessary which she agreed to try.  After the distance from her pubic symphysis to top of cervix was measured to be about 6 cm; a 2.3" (57 mm) ring pessary with support was placed. Patient was able to ambulate freely and use bathroom without expulsion; also felt comfortable.   Assessment & Plan:  Well-healing perineal laceration Symptomatic cystocele s/p pessary placement Will return to Dr. Hulan Fray to discuss surgical options; this will also be her  formal postpartum check Routine preventative health maintenance measures emphasized.   Verita Schneiders, MD, [place] Attending [place] for [employer], [place]

## 2014-01-11 ENCOUNTER — Encounter: Payer: Self-pay | Admitting: Obstetrics & Gynecology

## 2014-01-11 ENCOUNTER — Ambulatory Visit (INDEPENDENT_AMBULATORY_CARE_PROVIDER_SITE_OTHER): Payer: 59 | Admitting: Obstetrics & Gynecology

## 2014-01-11 VITALS — BP 109/81 | HR 49 | Ht 62.0 in | Wt 145.0 lb

## 2014-01-11 DIAGNOSIS — N813 Complete uterovaginal prolapse: Secondary | ICD-10-CM

## 2014-01-11 DIAGNOSIS — N814 Uterovaginal prolapse, unspecified: Secondary | ICD-10-CM

## 2014-01-11 NOTE — Progress Notes (Signed)
   Subjective:    Patient ID: [name], female    DOB: 27-Jun-1980, 33 y.o.   MRN: [id number]  HPI  Thsi 33 yo MW P2 CT surgery PA is here today because of increasing worse cystocele. This started after her first vaginal delivery and has worsened significantly since her second delivery a month ago (both greater than 9 pounds and NSVDs).  She has only occasional GSUI and declines a sling. She was seen here 2 days ago with this complaint and a pessary was placed. She says that this relieves the pelvic pressure that she feels, but she wants it corrected surgically.  Her other complaint is that she can sometimes see her cervix coming from her vagina.  She has no pp compaints.  Review of Systems She has always had normal pap smears. She is bottle feeding.    Objective:   Physical Exam  3rd degree cystocele and uterine prolapse      Assessment & Plan:  We had a long discussion of treatment options and their risks and benefits. At this time she would like to have a TVH/BS and anterior repair.

## 2014-01-15 NOTE — Patient Instructions (Addendum)
   Your procedure is scheduled on:  Wednesday, Dec 2nd  Enter through the [employer] of [facility] at: 11:30 AM Pick up the phone at the desk and dial [phone] and inform us of your arrival.  Please call this number if you have any problems the morning of surgery: [phone]  Remember: Do not eat food after midnight: Tuesday Do not drink clear liquids after: 9 AM Wednesday, day of surgery Take these medicines the morning of surgery with a SIP OF WATER:  None  Do not wear jewelry, make-up, or FINGER nail polish No metal in your hair or on your body. Do not wear lotions, powders, perfumes.  You may wear deodorant.  Do not bring valuables to the hospital. Contacts, dentures or bridgework may not be worn into surgery.  Leave suitcase in the car. After Surgery it may be brought to your room. For patients being admitted to the hospital, checkout time is 11:00am the day of discharge.  [place] [phone]

## 2014-01-16 ENCOUNTER — Encounter (HOSPITAL_COMMUNITY)
Admission: RE | Admit: 2014-01-16 | Discharge: 2014-01-16 | Disposition: A | Discharge status: Home / Self Care | Payer: 59 | Source: Ambulatory Visit | Attending: Obstetrics & Gynecology | Admitting: Obstetrics & Gynecology

## 2014-01-16 ENCOUNTER — Encounter (HOSPITAL_COMMUNITY): Payer: Self-pay

## 2014-01-16 DIAGNOSIS — Z01812 Encounter for preprocedural laboratory examination: Secondary | ICD-10-CM | POA: Insufficient documentation

## 2014-01-16 HISTORY — DX: Other specified postprocedural states: Z98.890

## 2014-01-16 HISTORY — DX: Nausea with vomiting, unspecified: R11.2

## 2014-01-16 LAB — CBC
HEMATOCRIT: 39.4 % (ref 36.0–46.0)
Hemoglobin: 13.7 g/dL (ref 12.0–15.0)
MCH: 33.3 pg (ref 26.0–34.0)
MCHC: 34.8 g/dL (ref 30.0–36.0)
MCV: 95.6 fL (ref 78.0–100.0)
Platelets: 168 10*3/uL (ref 150–400)
RBC: 4.12 MIL/uL (ref 3.87–5.11)
RDW: 12 % (ref 11.5–15.5)
WBC: 7 10*3/uL (ref 4.0–10.5)

## 2014-01-24 ENCOUNTER — Ambulatory Visit (HOSPITAL_COMMUNITY): Payer: 59 | Admitting: Anesthesiology

## 2014-01-24 ENCOUNTER — Encounter (HOSPITAL_COMMUNITY): Payer: Self-pay

## 2014-01-24 ENCOUNTER — Encounter: Payer: Self-pay | Admitting: Family Medicine

## 2014-01-24 ENCOUNTER — Encounter (HOSPITAL_COMMUNITY)
Admission: RE | Disposition: A | Discharge status: Home / Self Care | Payer: Self-pay | Source: Ambulatory Visit | Attending: Obstetrics & Gynecology

## 2014-01-24 ENCOUNTER — Ambulatory Visit (HOSPITAL_COMMUNITY)
Admission: RE | Admit: 2014-01-24 | Discharge: 2014-01-25 | Disposition: A | Discharge status: Home / Self Care | Payer: 59 | Source: Ambulatory Visit | Attending: Obstetrics & Gynecology | Admitting: Obstetrics & Gynecology

## 2014-01-24 DIAGNOSIS — Z9889 Other specified postprocedural states: Secondary | ICD-10-CM

## 2014-01-24 DIAGNOSIS — K900 Celiac disease: Secondary | ICD-10-CM | POA: Diagnosis not present

## 2014-01-24 DIAGNOSIS — N813 Complete uterovaginal prolapse: Principal | ICD-10-CM | POA: Insufficient documentation

## 2014-01-24 DIAGNOSIS — E785 Hyperlipidemia, unspecified: Secondary | ICD-10-CM | POA: Diagnosis not present

## 2014-01-24 DIAGNOSIS — Z85828 Personal history of other malignant neoplasm of skin: Secondary | ICD-10-CM | POA: Diagnosis not present

## 2014-01-24 HISTORY — PX: VAGINAL HYSTERECTOMY: SHX2639

## 2014-01-24 HISTORY — PX: BILATERAL SALPINGECTOMY: SHX5743

## 2014-01-24 HISTORY — PX: CYSTOCELE REPAIR: SHX163

## 2014-01-24 LAB — PREGNANCY, URINE: Preg Test, Ur: NEGATIVE

## 2014-01-24 SURGERY — Surgical Case
Anesthesia: *Unknown

## 2014-01-24 SURGERY — HYSTERECTOMY, VAGINAL
Anesthesia: Spinal | Site: Vagina

## 2014-01-24 MED ORDER — HYDROMORPHONE HCL 1 MG/ML IJ SOLN: 0.2000 mg | INTRAMUSCULAR | Status: DC | PRN

## 2014-01-24 MED ORDER — ONDANSETRON HCL 4 MG PO TABS
4.0000 mg | ORAL_TABLET | Freq: Four times a day (QID) | ORAL | Status: DC | PRN
  Administered 2014-01-24: 4 mg via ORAL
  Filled 2014-01-24: qty 1

## 2014-01-24 MED ORDER — KETOROLAC TROMETHAMINE 30 MG/ML IJ SOLN
INTRAMUSCULAR | Status: DC | PRN
  Administered 2014-01-24: 30 mg via INTRAVENOUS

## 2014-01-24 MED ORDER — ACETAMINOPHEN 160 MG/5ML PO SOLN
960.0000 mg | Freq: Four times a day (QID) | ORAL | Status: DC | PRN
  Administered 2014-01-24: 960 mg via ORAL

## 2014-01-24 MED ORDER — IBUPROFEN 800 MG PO TABS
800.0000 mg | ORAL_TABLET | Freq: Three times a day (TID) | ORAL | Status: DC | PRN
  Administered 2014-01-24 – 2014-01-25 (×3): 800 mg via ORAL
  Filled 2014-01-24 (×3): qty 1

## 2014-01-24 MED ORDER — CEFAZOLIN SODIUM-DEXTROSE 2-3 GM-% IV SOLR
2.0000 g | INTRAVENOUS | Status: AC
  Administered 2014-01-24: 2 g via INTRAVENOUS

## 2014-01-24 MED ORDER — ONDANSETRON HCL 4 MG/2ML IJ SOLN
INTRAMUSCULAR | Status: AC
  Filled 2014-01-24: qty 2

## 2014-01-24 MED ORDER — BUPIVACAINE HCL (PF) 0.5 % IJ SOLN
INTRAMUSCULAR | Status: AC
  Filled 2014-01-24: qty 30

## 2014-01-24 MED ORDER — ONDANSETRON HCL 4 MG/2ML IJ SOLN: 4.0000 mg | Freq: Four times a day (QID) | INTRAMUSCULAR | Status: DC | PRN

## 2014-01-24 MED ORDER — SODIUM CHLORIDE 0.9 % IJ SOLN
INTRAMUSCULAR | Status: AC
  Filled 2014-01-24: qty 150

## 2014-01-24 MED ORDER — HYDROMORPHONE HCL 1 MG/ML IJ SOLN: 0.2500 mg | INTRAMUSCULAR | Status: DC | PRN

## 2014-01-24 MED ORDER — SCOPOLAMINE 1 MG/3DAYS TD PT72
MEDICATED_PATCH | TRANSDERMAL | Status: AC
Start: 2014-01-24 — End: 2014-01-25
  Filled 2014-01-24: qty 1

## 2014-01-24 MED ORDER — BUPIVACAINE-EPINEPHRINE (PF) 0.5% -1:200000 IJ SOLN
INTRAMUSCULAR | Status: AC
  Filled 2014-01-24: qty 30

## 2014-01-24 MED ORDER — MIDAZOLAM HCL 5 MG/5ML IJ SOLN
INTRAMUSCULAR | Status: DC | PRN
  Administered 2014-01-24: 2 mg via INTRAVENOUS

## 2014-01-24 MED ORDER — DEXAMETHASONE SODIUM PHOSPHATE 4 MG/ML IJ SOLN
INTRAMUSCULAR | Status: DC | PRN
  Administered 2014-01-24: 4 mg via INTRAVENOUS

## 2014-01-24 MED ORDER — OXYCODONE-ACETAMINOPHEN 5-325 MG PO TABS: 1.0000 | ORAL_TABLET | ORAL | Status: DC | PRN

## 2014-01-24 MED ORDER — GLYCOPYRROLATE 0.2 MG/ML IJ SOLN
INTRAMUSCULAR | Status: AC
  Filled 2014-01-24: qty 1

## 2014-01-24 MED ORDER — FENTANYL CITRATE 0.05 MG/ML IJ SOLN
INTRAMUSCULAR | Status: AC
  Filled 2014-01-24: qty 5

## 2014-01-24 MED ORDER — MORPHINE SULFATE (PF) 0.5 MG/ML IJ SOLN
INTRAMUSCULAR | Status: DC | PRN
  Administered 2014-01-24: .1 mg via INTRATHECAL

## 2014-01-24 MED ORDER — MORPHINE SULFATE 0.5 MG/ML IJ SOLN
INTRAMUSCULAR | Status: AC
  Filled 2014-01-24: qty 10

## 2014-01-24 MED ORDER — FENTANYL CITRATE 0.05 MG/ML IJ SOLN
INTRAMUSCULAR | Status: DC | PRN
  Administered 2014-01-24 (×2): 50 ug via INTRAVENOUS

## 2014-01-24 MED ORDER — KETOROLAC TROMETHAMINE 30 MG/ML IJ SOLN
INTRAMUSCULAR | Status: AC
  Filled 2014-01-24: qty 1

## 2014-01-24 MED ORDER — ACETAMINOPHEN 160 MG/5ML PO SOLN
ORAL | Status: AC
  Filled 2014-01-24: qty 40.6

## 2014-01-24 MED ORDER — SCOPOLAMINE 1 MG/3DAYS TD PT72
1.0000 | MEDICATED_PATCH | Freq: Once | TRANSDERMAL | Status: DC
  Administered 2014-01-24: 1.5 mg via TRANSDERMAL

## 2014-01-24 MED ORDER — DEXAMETHASONE SODIUM PHOSPHATE 4 MG/ML IJ SOLN
INTRAMUSCULAR | Status: AC
  Filled 2014-01-24: qty 1

## 2014-01-24 MED ORDER — SODIUM CHLORIDE 0.9 % IJ SOLN
INTRAMUSCULAR | Status: DC | PRN
  Administered 2014-01-24: 100 mL

## 2014-01-24 MED ORDER — CEFAZOLIN SODIUM-DEXTROSE 2-3 GM-% IV SOLR
INTRAVENOUS | Status: AC
  Filled 2014-01-24: qty 50

## 2014-01-24 MED ORDER — LACTATED RINGERS IV SOLN
INTRAVENOUS | Status: DC
  Administered 2014-01-24: 125 mL/h via INTRAVENOUS
  Administered 2014-01-24: 14:00:00 via INTRAVENOUS

## 2014-01-24 MED ORDER — BUPIVACAINE-EPINEPHRINE (PF) 0.5% -1:200000 IJ SOLN
INTRAMUSCULAR | Status: AC
  Filled 2014-01-24: qty 60

## 2014-01-24 MED ORDER — BUPIVACAINE HCL (PF) 0.5 % IJ SOLN
INTRAMUSCULAR | Status: DC | PRN
  Administered 2014-01-24: 30 mL

## 2014-01-24 MED ORDER — FLAVOXATE HCL 100 MG PO TABS
100.0000 mg | ORAL_TABLET | Freq: Three times a day (TID) | ORAL | Status: DC | PRN
  Administered 2014-01-24 – 2014-01-25 (×2): 100 mg via ORAL
  Filled 2014-01-24 (×3): qty 1

## 2014-01-24 MED ORDER — ONDANSETRON HCL 4 MG/2ML IJ SOLN
INTRAMUSCULAR | Status: DC | PRN
  Administered 2014-01-24: 4 mg via INTRAVENOUS

## 2014-01-24 MED ORDER — GLYCOPYRROLATE 0.2 MG/ML IJ SOLN
INTRAMUSCULAR | Status: DC | PRN
  Administered 2014-01-24: 0.2 mg via INTRAVENOUS

## 2014-01-24 MED ORDER — MIDAZOLAM HCL 2 MG/2ML IJ SOLN
INTRAMUSCULAR | Status: AC
  Filled 2014-01-24: qty 2

## 2014-01-24 SURGICAL SUPPLY — 35 items
BLADE SURG 10 STRL SS (BLADE) ×6 IMPLANT
CANISTER SUCT 3000ML (MISCELLANEOUS) ×3 IMPLANT
CLOTH BEACON ORANGE TIMEOUT ST (SAFETY) ×3 IMPLANT
CONT PATH 16OZ SNAP LID 3702 (MISCELLANEOUS) ×3 IMPLANT
DECANTER SPIKE VIAL GLASS SM (MISCELLANEOUS) ×6 IMPLANT
DRSG TELFA 3X8 NADH (GAUZE/BANDAGES/DRESSINGS) ×3 IMPLANT
GAUZE SPONGE 4X4 16PLY XRAY LF (GAUZE/BANDAGES/DRESSINGS) ×6 IMPLANT
GLOVE BIO SURGEON STRL SZ 6.5 (GLOVE) ×3 IMPLANT
GLOVE BIOGEL PI IND STRL 6.5 (GLOVE) ×2 IMPLANT
GLOVE BIOGEL PI IND STRL 7.0 (GLOVE) ×6 IMPLANT
GLOVE BIOGEL PI INDICATOR 6.5 (GLOVE) ×1
GLOVE BIOGEL PI INDICATOR 7.0 (GLOVE) ×3
GLOVE ECLIPSE 7.0 STRL STRAW (GLOVE) ×3 IMPLANT
GOWN STRL REUS W/TWL LRG LVL3 (GOWN DISPOSABLE) ×9 IMPLANT
NEEDLE HYPO 22GX1.5 SAFETY (NEEDLE) IMPLANT
NEEDLE MAYO .5 CIRCLE (NEEDLE) ×3 IMPLANT
NEEDLE SPNL 18GX3.5 QUINCKE PK (NEEDLE) ×3 IMPLANT
NS IRRIG 1000ML POUR BTL (IV SOLUTION) ×3 IMPLANT
PACK VAGINAL WOMENS (CUSTOM PROCEDURE TRAY) ×3 IMPLANT
PAD OB MATERNITY 4.3X12.25 (PERSONAL CARE ITEMS) ×3 IMPLANT
SHEARS FOC LG CVD HARMONIC 17C (MISCELLANEOUS) IMPLANT
SUT CHROMIC 0 SH (SUTURE) IMPLANT
SUT CHROMIC 2 0 SH (SUTURE) IMPLANT
SUT VIC AB 0 CT1 27 (SUTURE)
SUT VIC AB 0 CT1 27XBRD ANBCTR (SUTURE) IMPLANT
SUT VIC AB 0 CT1 36 (SUTURE) IMPLANT
SUT VIC AB 2-0 CT1 18 (SUTURE) ×9 IMPLANT
SUT VIC AB 2-0 CT1 27 (SUTURE) ×4
SUT VIC AB 2-0 CT1 TAPERPNT 27 (SUTURE) ×8 IMPLANT
SUT VIC AB 2-0 SH 27 (SUTURE) ×3
SUT VIC AB 2-0 SH 27XBRD (SUTURE) ×6 IMPLANT
SUT VICRYL 0 TIES 12 18 (SUTURE) ×3 IMPLANT
TOWEL OR 17X24 6PK STRL BLUE (TOWEL DISPOSABLE) ×6 IMPLANT
TRAY FOLEY CATH 14FR (SET/KITS/TRAYS/PACK) ×3 IMPLANT
WATER STERILE IRR 1000ML POUR (IV SOLUTION) ×3 IMPLANT

## 2014-01-24 NOTE — Op Note (Signed)
01/24/2014  3:01 PM  PATIENT:  [name]  33 y.o. female  PRE-OPERATIVE DIAGNOSIS:    Uterine prolapse and 3rd degree cystocele  POST-OPERATIVE DIAGNOSIS:    Uterine prolapse and 3rd degree cystocele  PROCEDURE:  Procedure(s): HYSTERECTOMY VAGINAL (N/A) BILATERAL SALPINGECTOMY (Bilateral) ANTERIOR REPAIR (CYSTOCELE) (N/A)  PERINEORRHAPHY  SURGEON:  Surgeon(s) and Role:    * Emily Filbert, MD - Primary    ANESTHESIA:   spinal  EBL:  Total I/O In: 1600 [I.V.:1600] Out: 200 [Blood:200]  BLOOD ADMINISTERED:none  DRAINS: none   LOCAL MEDICATIONS USED:  MARCAINE     SPECIMEN:  Source of Specimen:  Uterus, tubes  DISPOSITION OF SPECIMEN:  PATHOLOGY  COUNTS:  YES  TOURNIQUET:  * No tourniquets in log *  DICTATION: .Dragon Dictation  PLAN OF CARE: Admit for overnight observation  PATIENT DISPOSITION:  PACU - hemodynamically stable.   Delay start of Pharmacological VTE agent (>24hrs) due to surgical blood loss or risk of bleeding: not applicable  The risks, benefits, alternatives of surgery were explained, understood, and accepted. All questions were answered and a consent form was signed. She was taken to the operating room and spinal anesthesia was applied. She was put in the dorsal lithotomy position. Her vagina and abdomen were prepped and draped in the usual sterile fashion. A timeout procedure was done. A bimanual exam revealed a small prolapsed uterus. A Foley catheter was placed and it drained clear urine throughout the case. The cervix was grasped with a single-tooth tenaculum. A total of 80 cc of dilute Marcaine was injected in a circumferential fashion at the cervicovaginal junction. An incision was made at the site. The posterior peritoneum was entered. A long weighted speculum was placed. The anterior peritoneum was entered. A Deaver was placed anteriorly. The uterosacral ligaments were clamped, cut, and ligated. They were tagged and held. 2 Vicryl sutures used  throughout this case unless otherwise specified. The small uterus was separated from its pelvic attachments using a similar clamp, cut, ligate technique. Excellent hemostasis was maintained throughout. Once the uterus was removed, the bowel was kept out of the operative site with a sponge on a stick. I was able to visualize each adnexa and grabbed them with a Babcock clamp. Using a Kelly clamp, I was able to clamp, cut, ligate and remove each oviduct. Hemostasis was maintained. I closed the peritoneum with a purse string closure. I encorporated the tagged uterosacral ligaments into the vaginal angles. I then injected more of the dilute marcaine into the anterior vaginal mucosa in a vertical fashion.  I then made a vertical incision at this site. I dissected the overlying vaginal mucosa and closed the defect with interrupted vaginal sutures. I then excised the excess vaginal mucosa and reapproximated the edges in a running locking fashion. Excellent hemostasis was maintained. I then spoke with Gwenevere about my recommendation that I do a perineal repair. She agreed to this. I infiltrated the perineum and a small amount of the posterior vaginal mucosa with dilute marcaine. I made an incision in a vertical fashion and dissected the vaginal mucosa off of the rectovaginal fascia. I closed the defect with interrupted vicryl sutures. The excess mucosa was removed and the edges were reapproximated with a 2-0 vicryl suture in a running fashion. I was able to place 3 of my fingers side by side in her vagina at the end of the case. She was taken to the recovery room in stable condition.

## 2014-01-24 NOTE — Anesthesia Postprocedure Evaluation (Signed)
  Anesthesia Post-op Note  Patient: [name]  Procedure(s) Performed: Procedure(s): HYSTERECTOMY VAGINAL (N/A) BILATERAL SALPINGECTOMY (Bilateral) ANTERIOR REPAIR (CYSTOCELE) (N/A)  Patient Location: PACU  Anesthesia Type:Spinal  Level of Consciousness: awake, alert  and oriented  Airway and Oxygen Therapy: Patient Spontanous Breathing  Post-op Pain: none  Post-op Assessment: Post-op Vital signs reviewed, Patient's Cardiovascular Status Stable, Respiratory Function Stable, Patent Airway, No signs of Nausea or vomiting and Pain level controlled  Post-op Vital Signs: Reviewed and stable  Last Vitals:  Filed Vitals:   01/24/14 1630  BP: 112/51  Pulse: 59  Temp: 36.6 C  Resp: 16    Complications: No apparent anesthesia complications

## 2014-01-24 NOTE — Plan of Care (Signed)
Problem: Phase I Progression Outcomes Goal: Pain controlled with appropriate interventions Outcome: Completed/Met Date Met:  01/24/14 Goal: VS, stable, temp < 100.4 degrees F Outcome: Completed/Met Date Met:  01/24/14 Goal: I & O every 4 hrs or as ordered Outcome: Completed/Met Date Met:  01/24/14 Goal: IS, TCDB as ordered Outcome: Completed/Met Date Met:  01/24/14

## 2014-01-24 NOTE — Plan of Care (Signed)
Problem: Phase II Progression Outcomes Goal: Afebrile, VS remain stable Outcome: Progressing

## 2014-01-24 NOTE — Plan of Care (Signed)
Problem: Phase I Progression Outcomes Goal: Admission history reviewed Outcome: Completed/Met Date Met:  01/24/14

## 2014-01-24 NOTE — H&P (Addendum)
  This 33 yo [name] is here today because of increasing worse cystocele. This started after her first vaginal delivery and has worsened significantly since her second delivery a month ago (both greater than 9 pounds and NSVDs). She has only occasional GSUI and declines a sling. She was seen here 2 days ago with this complaint and a pessary was placed. She says that this relieves the pelvic pressure that she feels, but she wants it corrected surgically. Her other complaint is that she can sometimes see her cervix coming from her vagina.  Pertinent Gynecological History: Menses: none as she is 5 weeks pp    Menstrual History: Menarche age: 7 No LMP recorded.    Past Medical History  Diagnosis Date  . Hyperlipidemia     DIET CONTROLLED  . Celiac disease   . Uterine prolapse 08/2009  . Basal cell carcinoma of nasolabial crease 2014  . SVD (spontaneous vaginal delivery)     X 2  . PONV (postoperative nausea and vomiting)     Past Surgical History  Procedure Laterality Date  . Tonsillectomy and adenoidectomy  1995  . Skin cancer excision      NOSE X 2  . Wisdom tooth extraction      Family History  Problem Relation Age of Onset  . Hypothyroidism Mother   . Hyperlipidemia Father   . Celiac disease Sister   . Hashimoto's thyroiditis Sister   . Heart disease Paternal Grandfather 48    multiple CABG  . Hyperlipidemia Paternal Grandfather   . Cancer Paternal Grandmother     pancreatic cancer    Social History:  reports that she has never smoked. She has never used smokeless tobacco. She reports that she drinks alcohol. She reports that she does not use illicit drugs.  Allergies: No Known Allergies  Prescriptions prior to admission  Medication Sig Dispense Refill Last Dose  . Prenatal Multivit-Min-Fe-FA (PRENATAL VITAMINS) 0.8 MG tablet Take 1 tablet by mouth daily. 30 tablet 12 01/23/2014 at 1700    ROS  Blood pressure 124/79, temperature 97.9 F (36.6 C),  temperature source Oral, resp. rate 16, height 5\' 2"  (1.575 m), weight 63.957 kg (141 lb), SpO2 100 %, not currently breastfeeding. Physical Exam  Heart- rrr Lungs- CTAB Abd- benign Pelvic- 3rd degree cystocele and uterine prolapse  Results for orders placed or performed during the hospital encounter of 01/24/14 (from the past 24 hour(s))  Pregnancy, urine     Status: None   Collection Time: 01/24/14 11:30 AM  Result Value Ref Range   Preg Test, Ur NEGATIVE NEGATIVE    No results found.  Assessment/Plan:  Symptomatic uterine prolapse and cystocele- plan for TVH/BS/anterior repair.  She understands the risks of surgery, including, but not to infection, bleeding, DVTs, damage to bowel, bladder, ureters. She wishes to proceed. She is aware that she will have an indwelling catheter for several days post op.     Markey Deady C. 01/24/2014, 12:24 PM

## 2014-01-24 NOTE — Plan of Care (Signed)
Problem: Phase I Progression Outcomes Goal: Pain controlled with appropriate interventions Outcome: Progressing     

## 2014-01-24 NOTE — Transfer of Care (Signed)
Immediate Anesthesia Transfer of Care Note  Patient: [name]  Procedure(s) Performed: Procedure(s): HYSTERECTOMY VAGINAL (N/A) BILATERAL SALPINGECTOMY (Bilateral) ANTERIOR REPAIR (CYSTOCELE) (N/A)  Patient Location: PACU  Anesthesia Type:Spinal  Level of Consciousness: awake, alert  and oriented  Airway & Oxygen Therapy: Patient Spontanous Breathing  Post-op Assessment: Report given to PACU RN and Post -op Vital signs reviewed and stable  Post vital signs: Reviewed and stable  Complications: No apparent anesthesia complications

## 2014-01-24 NOTE — Plan of Care (Signed)
Problem: Phase I Progression Outcomes Goal: I & O every 4 hrs or as ordered Outcome: Progressing

## 2014-01-24 NOTE — Anesthesia Postprocedure Evaluation (Signed)
  Anesthesia Post-op Note  Patient: [name]  Procedure(s) Performed: Procedure(s): HYSTERECTOMY VAGINAL (N/A) BILATERAL SALPINGECTOMY (Bilateral) ANTERIOR REPAIR (CYSTOCELE) (N/A)  Patient Location: PACU  Anesthesia Type:General  Level of Consciousness: awake, alert  and oriented  Airway and Oxygen Therapy: Patient Spontanous Breathing  Post-op Pain: mild  Post-op Assessment: Post-op Vital signs reviewed, Patient's Cardiovascular Status Stable, Respiratory Function Stable, Patent Airway, No signs of Nausea or vomiting and Pain level controlled  Post-op Vital Signs: Reviewed and stable  Last Vitals:  Filed Vitals:   01/24/14 1630  BP: 112/51  Pulse: 59  Temp: 36.6 C  Resp: 16    Complications: No apparent anesthesia complications

## 2014-01-24 NOTE — Anesthesia Preprocedure Evaluation (Addendum)

## 2014-01-24 NOTE — Plan of Care (Signed)
Problem: Phase I Progression Outcomes Goal: Dangle/OOB as tolerated per MD order Outcome: Progressing

## 2014-01-24 NOTE — Plan of Care (Signed)
Problem: Phase II Progression Outcomes Goal: Progress activity as tolerated unless otherwise ordered Outcome: Progressing     

## 2014-01-24 NOTE — Plan of Care (Signed)
Problem: Phase II Progression Outcomes Goal: Pain controlled on oral analgesia Outcome: Progressing

## 2014-01-24 NOTE — Plan of Care (Signed)
Problem: Phase I Progression Outcomes Goal: VS, stable, temp < 100.4 degrees F Outcome: Progressing

## 2014-01-24 NOTE — Plan of Care (Signed)
Problem: Phase I Progression Outcomes Goal: IS, TCDB as ordered Outcome: Progressing

## 2014-01-25 ENCOUNTER — Encounter (HOSPITAL_COMMUNITY): Payer: Self-pay | Admitting: Obstetrics & Gynecology

## 2014-01-25 DIAGNOSIS — N813 Complete uterovaginal prolapse: Secondary | ICD-10-CM | POA: Diagnosis not present

## 2014-01-25 LAB — CBC
HCT: 29.5 % — ABNORMAL LOW (ref 36.0–46.0)
HEMOGLOBIN: 10.3 g/dL — AB (ref 12.0–15.0)
MCH: 32.9 pg (ref 26.0–34.0)
MCHC: 34.9 g/dL (ref 30.0–36.0)
MCV: 94.2 fL (ref 78.0–100.0)
Platelets: 161 10*3/uL (ref 150–400)
RBC: 3.13 MIL/uL — ABNORMAL LOW (ref 3.87–5.11)
RDW: 12.1 % (ref 11.5–15.5)
WBC: 10 10*3/uL (ref 4.0–10.5)

## 2014-01-25 MED ORDER — FLAVOXATE HCL 100 MG PO TABS: 100.0000 mg | ORAL_TABLET | Freq: Three times a day (TID) | ORAL | Status: DC | PRN

## 2014-01-25 MED ORDER — IBUPROFEN 800 MG PO TABS: 800.0000 mg | ORAL_TABLET | Freq: Three times a day (TID) | ORAL | Status: DC | PRN

## 2014-01-25 MED ORDER — OXYCODONE-ACETAMINOPHEN 5-325 MG PO TABS: 1.0000 | ORAL_TABLET | ORAL | Status: DC | PRN

## 2014-01-25 NOTE — Addendum Note (Signed)
Addendum  created 01/25/14 0754 by Raenette Rover, CRNA   Modules edited: Notes Section   Notes Section:  File: [id number]

## 2014-01-25 NOTE — Telephone Encounter (Signed)
Response is in child's chart.

## 2014-01-25 NOTE — Discharge Summary (Signed)
Physician Discharge Summary  Patient ID: [name] MRN: [id number] DOB/AGE: Mar 18, 1980 33 y.o.  Admit date: 01/24/2014 Discharge date: 01/25/2014  Admission Diagnoses: symptomatic uterine prolapse and cystocele   Discharge Diagnoses: same  Hospital Course: She underwent an uncomplicated TVH/BS/anterior repair/perineorrhaphy under spinal anesthesia. Post operatively she did well, ambulating, tolerating po well, and voicing her readiness to go home.  Discharge Exam: Blood pressure 95/47, pulse 51, temperature 97.8 F (36.6 C), temperature source Axillary, resp. rate 16, height 5\' 2"  (1.575 m), weight 63.957 kg (141 lb), SpO2 100 %, not currently breastfeeding. General appearance: alert Resp: clear to auscultation bilaterally Cardio: regular rate and rhythm, S1, S2 normal, no murmur, click, rub or gallop GI: soft, non-tender; bowel sounds normal; no masses,  no organomegaly  I removed her vaginal packing.  Disposition: 01-Home or Self Care     Medication List    TAKE these medications        flavoxATE 100 MG tablet  Commonly known as:  URISPAS  Take 1 tablet (100 mg total) by mouth 3 (three) times daily as needed for bladder spasms.     ibuprofen 800 MG tablet  Commonly known as:  ADVIL,MOTRIN  Take 1 tablet (800 mg total) by mouth every 8 (eight) hours as needed (mild pain).     oxyCODONE-acetaminophen 5-325 MG per tablet  Commonly known as:  PERCOCET/ROXICET  Take 1-2 tablets by mouth every 4 (four) hours as needed for severe pain (moderate to severe pain (when tolerating fluids)).     Prenatal Vitamins 0.8 MG tablet  Take 1 tablet by mouth daily.           Follow-up Information    Follow up with Carlus Stay C., MD. Schedule an appointment as soon as possible for a visit in 1 month.   Specialty:  Obstetrics and Gynecology   Contact information:   [place] [address] [phone]       Signed: Emily Filbert 01/25/2014, 11:58 AM

## 2014-01-25 NOTE — Anesthesia Postprocedure Evaluation (Signed)
  Anesthesia Post-op Note  Patient: [name]  Procedure(s) Performed: Procedure(s): HYSTERECTOMY VAGINAL (N/A) BILATERAL SALPINGECTOMY (Bilateral) ANTERIOR REPAIR (CYSTOCELE) (N/A)  Patient Location: [facility]  Anesthesia Type:Spinal  Level of Consciousness: awake, alert , oriented and patient cooperative  Airway and Oxygen Therapy: Patient Spontanous Breathing  Post-op Pain: mild  Post-op Assessment: Post-op Vital signs reviewed, Patient's Cardiovascular Status Stable, Respiratory Function Stable, Patent Airway, No headache, No backache, No residual numbness and No residual motor weakness  Post-op Vital Signs: Reviewed and stable  Last Vitals:  Filed Vitals:   01/25/14 0514  BP: 185/33  Pulse: 46  Temp: 36.7 C  Resp: 16    Complications: No apparent anesthesia complications

## 2014-01-25 NOTE — Progress Notes (Signed)
Pt is discharged in the care of husband per ambulatory,with N.T. Escort. Denies pain or discomfort. Foley catheter in place and draining well.  Questions were asked and answered. Discharge instructions with Rx were given to pt  With good understanding. Instructions were given on foley leg bag. Pt understands all instructions well.

## 2014-01-25 NOTE — Telephone Encounter (Signed)
Would you please take a look at this for me, in Dr Hulen Shouts absence

## 2014-01-25 NOTE — Discharge Instructions (Signed)
Anterior and Posterior Colporrhaphy Anterior or posterior colporrhaphy is surgery to fix a prolapse of organs in the genital tract. Prolapse means the falling down, bulging, dropping, or drooping of an organ. Organs that commonly prolapse include the rectum, bladder, vagina, and uterus. Prolapse can affect a single organ or several organs at the same time. This often worsens when women stop having their monthly periods (menopause) because estrogen loss weakens the muscles and tissues in the genital tract. In addition, prolapse happens when the organs are damaged or weakened. This commonly happens after childbirth and as a result of aging. Surgery is often done for severe prolapses.  The type of colporrhaphy done depends on the type of genital prolapse. Types of genital prolapse include the following:   Cystocele. This is a prolapse of the upper (anterior) wall of the vagina. The anterior wall bulges into the vagina and brings the bladder with it.   Rectocele. This is a prolapse of the lower (posterior) wall of the vagina. The posterior vaginal wall bulges into the vagina and brings the rectum with it.   Enterocele. This is a prolapse of part of the pelvic organs called the pouch of Douglas. It also involves a portion of the small bowel. It appears as a bulge under the neck of the uterus at the top of the back wall of the vagina.   Procidentia. This is a complete prolapse of the uterus and the cervix. The prolapse can be seen and felt coming out of the vagina. LET [facility] CARE PROVIDER KNOW ABOUT:   Any allergies you have.   All medicines you are taking, including vitamins, herbs, eye drops, creams, and over-the-counter medicines.   Previous problems you or members of your family have had with the use of anesthetics.   Any blood disorders you have.   Previous surgeries you have had.   Medical conditions you have.   Smoking history or history of alcohol use.   Possibility of  pregnancy, if this applies.  RISKS AND COMPLICATIONS Generally, anterior or posterior colporrhaphy is a safe procedure. However, as with any procedure, complications can occur. Possible complications include:   Infection.   Damage to other organs during surgery.   Bleeding after surgery.   Problems urinating.   Problems from the anesthetic.  BEFORE THE PROCEDURE  Ask your health care provider about changing or stopping your regular medicines.   Do not eat or drink anything for at least 8 hours before the surgery.   If you smoke, do not smoke for at least 2 weeks before the surgery.   Make plans to have someone drive you home after your hospital stay. Also, arrange for someone to help you with activities during recovery. PROCEDURE  You may be given medicine to help you relax before the surgery (sedative). During the surgery you will be given medicine to make you sleep through the procedure (general anesthetic) or medicine to numb you from the waist down (spinal anesthetic). This medicine will be given through an intravenous (IV) access tube that is put into one of your veins.  The procedure will vary depending on the type of repair:   Anterior repair. A cut (incision) is made in the midline section of the front part of the vaginal wall. A triangular-shaped piece of vaginal tissue is removed, and the stronger, healthier tissue is sewn together in order to support and suspend the bladder.   Posterior repair. An incision is made midline on the back wall of the  vagina. A triangular portion of vaginal skin is removed to expose the muscle. Excess tissue is removed, and stronger, healthier muscle and ligament tissue is sewn together to support the rectum.   Anterior and posterior repair. Both procedures are done during the same surgery. AFTER THE PROCEDURE You will be taken to a recovery area. Your blood pressure, pulse, breathing, and temperature (vital signs) will be monitored.  This is done until you are stable. Then you will be transferred to a hospital room.  After surgery, you will have a small rubber tube in place to drain your bladder (urinary catheter). This will be in place for 2 to 7 days or until your bladder is working properly on its own. The IV access tube will be removed in 1 to 3 days. You may have a gauze packing in your vagina to prevent bleeding. This will be removed 2 or 3 days after the surgery. You will likely need to stay in the hospital for 3 to 5 days.  Document Released: 05/02/2003 Document Revised: 10/12/2012 Document Reviewed: 07/01/2012 [facility] Patient Information 2015 [place], [place]. This information is not intended to replace advice given to you by your health care provider. Make sure you discuss any questions you have with your health care provider. Abdominal Hysterectomy, Care After These instructions give you information on caring for yourself after your procedure. Your doctor may also give you more specific instructions. Call your doctor if you have any problems or questions after your procedure.  HOME CARE It takes 4-6 weeks to recover from this surgery. Follow all of your doctor's instructions.   Only take medicines as told by your doctor.  Change your bandage as told by your doctor.  Return to your doctor to have your stitches taken out.  Take showers for 2-3 weeks. Ask your doctor when it is okay to shower.  Do not douche, use tampons, or have sex (intercourse) for at least 6 weeks or as told.  Follow your doctor's advice about exercise, lifting objects, driving, and general activities.  Get plenty of rest and sleep.  Do not lift anything heavier than a gallon of milk (about 10 pounds [4.5 kilograms]) for the first month after surgery.  Get back to your normal diet as told by your doctor.  Do not drink alcohol until your doctor says it is okay.  Take a medicine to help you poop (laxative) as told by your doctor.  Eating  foods high in fiber may help you poop. Eat a lot of raw fruits and vegetables, whole grains, and beans.  Drink enough fluids to keep your pee (urine) clear or pale yellow.  Have someone help you at home for 1-2 weeks after your surgery.  Keep follow-up doctor visits as told. GET HELP IF:  You have chills or fever.  You have puffiness, redness, or pain in area of the cut (incision).  You have yellowish-white fluid (pus) coming from the cut.  You have a bad smell coming from the cut or bandage.  Your cut pulls apart.  You feel dizzy or light-headed.  You have pain or bleeding when you pee.  You keep having watery poop (diarrhea).  You keep feeling sick to your stomach (nauseous) or keep throwing up (vomiting).  You have fluid (discharge) coming from your vagina.  You have a rash.  You have a reaction to your medicine.  You need stronger pain medicine. GET HELP RIGHT AWAY IF:   You have a fever and your symptoms suddenly  get worse.  You have bad belly (abdominal) pain.  You have chest pain.  You are short of breath.  You pass out (faint).  You have pain, puffiness, or redness of your leg.  You bleed a lot from your vagina and notice clumps of tissue (clots). MAKE SURE YOU:   Understand these instructions.  Will watch your condition.  Will get help right away if you are not doing well or get worse. Document Released: 11/19/2007 Document Revised: 02/14/2013 Document Reviewed: 12/02/2012 [facility] Patient Information 2015 [place], [place]. This information is not intended to replace advice given to you by your health care provider. Make sure you discuss any questions you have with your health care provider.

## 2014-01-27 HISTORY — PX: TOTAL VAGINAL HYSTERECTOMY: SHX2548

## 2014-01-30 ENCOUNTER — Ambulatory Visit: Payer: 59 | Admitting: Family Medicine

## 2014-02-02 ENCOUNTER — Other Ambulatory Visit: Payer: Self-pay | Admitting: *Deleted

## 2014-02-02 DIAGNOSIS — O2240 Hemorrhoids in pregnancy, unspecified trimester: Secondary | ICD-10-CM

## 2014-02-02 MED ORDER — HYDROCORTISONE 2.5 % RE CREA: 1.0000 "application " | TOPICAL_CREAM | Freq: Two times a day (BID) | RECTAL | Status: DC

## 2014-02-02 NOTE — Telephone Encounter (Signed)
Pt called and needed something called in for hemorrhoids.

## 2014-02-20 ENCOUNTER — Ambulatory Visit (INDEPENDENT_AMBULATORY_CARE_PROVIDER_SITE_OTHER): Payer: 59 | Admitting: Obstetrics & Gynecology

## 2014-02-20 VITALS — BP 134/81 | HR 56 | Ht 61.0 in | Wt 141.0 lb

## 2014-02-20 DIAGNOSIS — Z9889 Other specified postprocedural states: Secondary | ICD-10-CM

## 2014-02-20 NOTE — Progress Notes (Signed)
   Subjective:    Patient ID: [name], female    DOB: 06-24-1980, 33 y.o.   MRN: [id number]  HPI  [name] is a 33 yo MW lady who is here for a post op visit. She is 4 weeks post op s/p TVH/ anterior repair, and perineorraphy. She reports normal bowel and bladder function now. She wants to return to work next week. She has not had sex yet.  Review of Systems     Objective:   Physical Exam  Well healed perineum and vagina. She still has some intact sutures.  She has excellent vaginal vault support.      Assessment & Plan:  Post op - doing well RTC 1 year/prn sooner. She may return to work She will delay sex for another 2 weeks at least.

## 2014-05-23 ENCOUNTER — Encounter: Payer: Self-pay | Admitting: Family Medicine

## 2014-05-23 ENCOUNTER — Other Ambulatory Visit: Payer: Self-pay | Admitting: Family Medicine

## 2014-05-23 DIAGNOSIS — R197 Diarrhea, unspecified: Secondary | ICD-10-CM

## 2014-05-23 NOTE — Addendum Note (Signed)
Addended by: Ellamae Sia on: 05/23/2014 06:50 PM   Modules accepted: Orders

## 2014-05-24 ENCOUNTER — Encounter: Payer: Self-pay | Admitting: Family Medicine

## 2014-05-24 LAB — C. DIFFICILE GDH AND TOXIN A/B
C. DIFF TOXIN A/B: NOT DETECTED
C. difficile GDH: NOT DETECTED

## 2014-06-05 ENCOUNTER — Encounter: Payer: 59 | Admitting: Family Medicine

## 2014-06-13 ENCOUNTER — Encounter: Payer: 59 | Admitting: Family Medicine

## 2014-06-20 ENCOUNTER — Encounter: Payer: Self-pay | Admitting: Family Medicine

## 2014-06-20 ENCOUNTER — Other Ambulatory Visit: Payer: Self-pay | Admitting: Family Medicine

## 2014-06-20 ENCOUNTER — Ambulatory Visit (INDEPENDENT_AMBULATORY_CARE_PROVIDER_SITE_OTHER): Payer: 59 | Admitting: Family Medicine

## 2014-06-20 VITALS — BP 104/68 | HR 55 | Temp 98.1°F | Ht 61.0 in | Wt 129.0 lb

## 2014-06-20 DIAGNOSIS — E785 Hyperlipidemia, unspecified: Secondary | ICD-10-CM

## 2014-06-20 DIAGNOSIS — Z01419 Encounter for gynecological examination (general) (routine) without abnormal findings: Secondary | ICD-10-CM

## 2014-06-20 DIAGNOSIS — Z0000 Encounter for general adult medical examination without abnormal findings: Secondary | ICD-10-CM | POA: Diagnosis not present

## 2014-06-20 LAB — CBC WITH DIFFERENTIAL/PLATELET
BASOS PCT: 0.5 % (ref 0.0–3.0)
Basophils Absolute: 0 10*3/uL (ref 0.0–0.1)
EOS PCT: 1.3 % (ref 0.0–5.0)
Eosinophils Absolute: 0.1 10*3/uL (ref 0.0–0.7)
HCT: 39.9 % (ref 36.0–46.0)
HEMOGLOBIN: 13.6 g/dL (ref 12.0–15.0)
LYMPHS PCT: 36.2 % (ref 12.0–46.0)
Lymphs Abs: 1.8 10*3/uL (ref 0.7–4.0)
MCHC: 34 g/dL (ref 30.0–36.0)
MCV: 89.8 fl (ref 78.0–100.0)
Monocytes Absolute: 0.4 10*3/uL (ref 0.1–1.0)
Monocytes Relative: 8.1 % (ref 3.0–12.0)
Neutro Abs: 2.7 10*3/uL (ref 1.4–7.7)
Neutrophils Relative %: 53.9 % (ref 43.0–77.0)
Platelets: 208 10*3/uL (ref 150.0–400.0)
RBC: 4.44 Mil/uL (ref 3.87–5.11)
RDW: 13.1 % (ref 11.5–15.5)
WBC: 5 10*3/uL (ref 4.0–10.5)

## 2014-06-20 LAB — LIPID PANEL
Cholesterol: 173 mg/dL (ref 0–200)
HDL: 45.8 mg/dL (ref >39.00)
LDL CALC: 117 mg/dL — AB (ref 0–99)
NonHDL: 127.2
Total CHOL/HDL Ratio: 4
Triglycerides: 52 mg/dL (ref 0.0–149.0)
VLDL: 10.4 mg/dL (ref 0.0–40.0)

## 2014-06-20 LAB — COMPREHENSIVE METABOLIC PANEL
ALT: 14 U/L (ref 0–35)
AST: 18 U/L (ref 0–37)
Albumin: 4.6 g/dL (ref 3.5–5.2)
Alkaline Phosphatase: 56 U/L (ref 39–117)
BILIRUBIN TOTAL: 0.8 mg/dL (ref 0.2–1.2)
BUN: 16 mg/dL (ref 6–23)
CO2: 29 meq/L (ref 19–32)
CREATININE: 1.25 mg/dL — AB (ref 0.40–1.20)
Calcium: 9.6 mg/dL (ref 8.4–10.5)
Chloride: 104 mEq/L (ref 96–112)
GFR: 52.05 mL/min — AB (ref >60.00)
Glucose, Bld: 81 mg/dL (ref 70–99)
Potassium: 3.6 mEq/L (ref 3.5–5.1)
SODIUM: 138 meq/L (ref 135–145)
TOTAL PROTEIN: 7.3 g/dL (ref 6.0–8.3)

## 2014-06-20 LAB — VITAMIN D 25 HYDROXY (VIT D DEFICIENCY, FRACTURES): VITD: 22.14 ng/mL — ABNORMAL LOW (ref 30.00–100.00)

## 2014-06-20 LAB — TSH: TSH: 1.26 u[IU]/mL (ref 0.35–4.50)

## 2014-06-20 LAB — VITAMIN B12: VITAMIN B 12: 440 pg/mL (ref 211–911)

## 2014-06-20 LAB — T4, FREE: Free T4: 0.74 ng/dL (ref 0.60–1.60)

## 2014-06-20 MED ORDER — VITAMIN D (ERGOCALCIFEROL) 1.25 MG (50000 UNIT) PO CAPS: 50000.0000 [IU] | ORAL_CAPSULE | ORAL | Status: DC

## 2014-06-20 NOTE — Assessment & Plan Note (Signed)
Reviewed preventive care protocols, scheduled due services, and updated immunizations Discussed nutrition, exercise, diet, and healthy lifestyle.  Orders Placed This Encounter  Procedures  . CBC with Differential/Platelet  . Comprehensive metabolic panel  . Lipid panel  . TSH  . Vitamin B12  . Vitamin D, 25-hydroxy  . T4, Free

## 2014-06-20 NOTE — Progress Notes (Signed)
Pre visit review using our clinic review tool, if applicable. No additional management support is needed unless otherwise documented below in the visit note. 

## 2014-06-20 NOTE — Patient Instructions (Signed)
Great to see you. You look fantastic!   We will call you with your lab results.

## 2014-06-20 NOTE — Progress Notes (Signed)
Subjective:   Patient ID: [name], female    DOB: [birth year], 34 y.o.   MRN: [id number]  [name] is a pleasant 34 y.o. year old female who presents to clinic today with Annual Exam  on 06/20/2014  HPI:  Doing well since her hysterectomy.   She does feel like she has more "mommy brain"- forgets things more easily, puts things away in wrong place (put butter in the cabinet yesterday). Denies any other symptoms - headaches, blurred vision, UE or LE weakness or tingling.    Does have strong family history of thyroid disease- mom is hypothyroid and sister has hashimotos.  No current outpatient prescriptions on file prior to visit.   No current facility-administered medications on file prior to visit.    No Known Allergies  Past Medical History  Diagnosis Date  . Hyperlipidemia     DIET CONTROLLED  . Celiac disease   . Uterine prolapse 08/2009  . Basal cell carcinoma of nasolabial crease 2014  . SVD (spontaneous vaginal delivery)     X 2  . PONV (postoperative nausea and vomiting)     Past Surgical History  Procedure Laterality Date  . Tonsillectomy and adenoidectomy  1995  . Skin cancer excision      NOSE X 2  . Wisdom tooth extraction    . Vaginal hysterectomy N/A 01/24/2014    Procedure: HYSTERECTOMY VAGINAL;  Surgeon: Emily Filbert, MD;  Location: [facility];  Service: Gynecology;  Laterality: N/A;  . Bilateral salpingectomy Bilateral 01/24/2014    Procedure: BILATERAL SALPINGECTOMY;  Surgeon: Emily Filbert, MD;  Location: [facility];  Service: Gynecology;  Laterality: Bilateral;  . Cystocele repair N/A 01/24/2014    Procedure: ANTERIOR REPAIR (CYSTOCELE);  Surgeon: Emily Filbert, MD;  Location: [facility];  Service: Gynecology;  Laterality: N/A;    Family History  Problem Relation Age of Onset  . Hypothyroidism Mother   . Hyperlipidemia Father   . Celiac disease Sister   . Hashimoto's thyroiditis Sister   . Heart disease Paternal Grandfather 40    multiple CABG  .  Hyperlipidemia Paternal Grandfather   . Cancer Paternal Grandmother     pancreatic cancer    History   Social History  . Marital Status: Married    Spouse Name: N/A  . Number of Children: N/A  . Years of Education: N/A   Occupational History  . Not on file.   Social History Main Topics  . Smoking status: Never Smoker   . Smokeless tobacco: Never Used  . Alcohol Use: Yes     Comment: occassionally  . Drug Use: No  . Sexual Activity:    Partners: Male    Birth Control/ Protection: None   Other Topics Concern  . 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   Married, 1 daughter named [name] (born 2011).   The PMH, PSH, Social History, Family History, Medications, and allergies have been reviewed in [facility], and have been updated if relevant.   Review of Systems  Constitutional: Negative.   HENT: Negative.   Respiratory: Negative.   Cardiovascular: Negative.   Gastrointestinal: Negative.   Endocrine: Negative.   Genitourinary: Negative.   Musculoskeletal: Negative.   Skin: Negative.   Allergic/Immunologic: Negative.   Neurological: Negative.   Hematological: Negative.   Psychiatric/Behavioral: Negative.   All other systems reviewed and are negative.  Objective:    BP 104/68 mmHg  Pulse 55  Temp(Src) 98.1 F (36.7 C) (Tympanic)  Ht 5\' 1"  (1.549 m)  Wt 129 lb (58.514 kg)  BMI 24.39 kg/m2  SpO2 98%  LMP 03/08/2013 Wt Readings from Last 3 Encounters:  06/20/14 129 lb (58.514 kg)  02/20/14 141 lb (63.957 kg)  01/11/14 145 lb (65.772 kg)     Physical Exam    General:  Well-developed,well-nourished,in no acute distress; alert,appropriate and cooperative throughout examination Head:  normocephalic and atraumatic.   Eyes:  vision grossly intact, pupils equal, pupils round, and pupils reactive to light.   Ears:  R ear normal and L ear normal.   Nose:  no external deformity.   Mouth:   good dentition.   Neck:  Thyroid fullness, no nodules or TTP Lungs:  Normal respiratory effort, chest expands symmetrically. Lungs are clear to auscultation, no crackles or wheezes. Heart:  Normal rate and regular rhythm. S1 and S2 normal without gallop, murmur, click, rub or other extra sounds. Abdomen:  Bowel sounds positive,abdomen soft and non-tender without masses, organomegaly or hernias noted. Msk:  No deformity or scoliosis noted of thoracic or lumbar spine.   Extremities:  No clubbing, cyanosis, edema, or deformity noted with normal full range of motion of all joints.   Neurologic:  alert & oriented X3 and gait normal.   Skin:  Intact without suspicious lesions or rashes Cervical Nodes:  No lymphadenopathy noted Axillary Nodes:  No palpable lymphadenopathy Psych:  Cognition and judgment appear intact. Alert and cooperative with normal attention span and concentration. No apparent delusions, illusions, hallucinations      Assessment & Plan:   Well woman exam - Plan: CBC with Differential/Platelet, Comprehensive metabolic panel, Lipid panel, TSH, Vitamin B12, Vitamin D, 25-hydroxy  Hyperlipidemia No Follow-up on file.

## 2014-06-24 ENCOUNTER — Encounter: Payer: Self-pay | Admitting: Family Medicine

## 2014-07-13 ENCOUNTER — Emergency Department (HOSPITAL_COMMUNITY)
Admission: EM | Admit: 2014-07-13 | Discharge: 2014-07-13 | Disposition: A | Discharge status: Home / Self Care | Payer: 59 | Source: Home / Self Care | Attending: Internal Medicine | Admitting: Internal Medicine

## 2014-07-13 ENCOUNTER — Encounter (HOSPITAL_COMMUNITY): Payer: Self-pay | Admitting: Emergency Medicine

## 2014-07-13 DIAGNOSIS — L259 Unspecified contact dermatitis, unspecified cause: Secondary | ICD-10-CM

## 2014-07-13 DIAGNOSIS — L249 Irritant contact dermatitis, unspecified cause: Secondary | ICD-10-CM

## 2014-07-13 DIAGNOSIS — L509 Urticaria, unspecified: Secondary | ICD-10-CM

## 2014-07-13 MED ORDER — PREDNISONE 50 MG PO TABS: 50.0000 mg | ORAL_TABLET | Freq: Every day | ORAL | Status: AC

## 2014-07-13 NOTE — ED Provider Notes (Signed)
CSN: [id number]     Arrival date & time 07/13/14  1918 History   First MD Initiated Contact with Patient 07/13/14 2005     Chief Complaint  Patient presents with  . Rash   HPI History of hand dermatitis, sensitive to topical agents and prone to dryness.Has had dermatitis flares with exposure to bleach containing products.  Spends a lot of time scrubbed in in [place].  Blotchy/itchy hive-like reaction B dorsal wrists/distal forearms earlier today, now subsiding. Not coughing/wheezing, no dyspnea, no N/V/cramping reported.    Past Medical History  Diagnosis Date  . Hyperlipidemia     DIET CONTROLLED  . Celiac disease   . Uterine prolapse 08/2009  . Basal cell carcinoma of nasolabial crease 2014  . SVD (spontaneous vaginal delivery)     X 2  . PONV (postoperative nausea and vomiting)    Past Surgical History  Procedure Laterality Date  . Tonsillectomy and adenoidectomy  1995  . Skin cancer excision      NOSE X 2  . Wisdom tooth extraction    . Vaginal hysterectomy N/A 01/24/2014    Procedure: HYSTERECTOMY VAGINAL;  Surgeon: Emily Filbert, MD;  Location: [facility];  Service: Gynecology;  Laterality: N/A;  . Bilateral salpingectomy Bilateral 01/24/2014    Procedure: BILATERAL SALPINGECTOMY;  Surgeon: Emily Filbert, MD;  Location: [facility];  Service: Gynecology;  Laterality: Bilateral;  . Cystocele repair N/A 01/24/2014    Procedure: ANTERIOR REPAIR (CYSTOCELE);  Surgeon: Emily Filbert, MD;  Location: [facility];  Service: Gynecology;  Laterality: N/A;   Family History  Problem Relation Age of Onset  . Hypothyroidism Mother   . Hyperlipidemia Father   . Celiac disease Sister   . Hashimoto's thyroiditis Sister   . Heart disease Paternal Grandfather 26    multiple CABG  . Hyperlipidemia Paternal Grandfather   . Cancer Paternal Grandmother     pancreatic cancer   History  Substance Use Topics  . Smoking status: Never Smoker   . Smokeless tobacco: Never Used  . Alcohol Use: Yes     Comment:  occassionally   OB History    Gravida Para Term Preterm AB TAB SAB Ectopic Multiple Living   2 2 2       2      Review of Systems  All other systems reviewed and are negative.   Allergies  Review of patient's allergies indicates no known allergies.  Home Medications   Prior to Admission medications   Medication Sig Start Date End Date Taking? Authorizing Provider  Vitamin D, Ergocalciferol, (DRISDOL) 50000 UNITS CAPS capsule Take 1 capsule (50,000 Units total) by mouth every 7 (seven) days. 06/20/14  Yes Lucille Passy, MD   BP 125/78 mmHg  Pulse 77  Temp(Src) 98.1 F (36.7 C) (Oral)  Resp 16  SpO2 100%  LMP 03/08/2013 Physical Exam  Constitutional: She is oriented to person, place, and time. No distress.  Alert, nicely groomed  HENT:  Head: Atraumatic.  Eyes:  Conjugate gaze, no eye redness/drainage  Neck: Neck supple.  Cardiovascular: Normal rate.   Pulmonary/Chest: No respiratory distress.  Abdominal: She exhibits no distension.  Musculoskeletal: Normal range of motion.  No leg swelling  Neurological: She is alert and oriented to person, place, and time.  Skin: Skin is warm and dry.  No cyanosis Erthema/dryness to dorsal wrists/distal forearms bilaterally, no fissures, no apparent scale.  No papules. Not swollen.    Nursing note and vitals reviewed.   ED Course  Procedures (including critical care time) Labs Review Labs Reviewed - No data to display  Imaging Review No results found.   MDM   1. Irritant hand dermatitis   2. Urticaria   Rx prednisone pulse, in case further difficulties with urticaria develop. Benadryl or claritin/zyrtec po for itching if urticaria recurs. Avoid scented skin products, exposure to cleansers and other topicals other than dove soap, water, triamcinolone (pt has). Rinse carefully after washing with soap, pat dry (do not rub). Topical triamcinolone qhs under occlusion; might benefit from derm evaluation if not improving in a few  days.       Sherlene Shams, MD 07/14/14 [phone]

## 2014-07-13 NOTE — Discharge Instructions (Signed)
Use triamcinolone topically to hands/wrists with plastic wrap on top, overnight.  Prescription for prednisone if needed.  Recheck if not improving in a few days.  Eczema Eczema, also called atopic dermatitis, is a skin disorder that causes inflammation of the skin. It causes a red rash and dry, scaly skin. The skin becomes very itchy. Eczema is generally worse during the cooler winter months and often improves with the warmth of summer. Eczema usually starts showing signs in infancy. Some children outgrow eczema, but it may last through adulthood.  CAUSES  The exact cause of eczema is not known, but it appears to run in families. People with eczema often have a family history of eczema, allergies, asthma, or hay fever. Eczema is not contagious. Flare-ups of the condition may be caused by:   Contact with something you are sensitive or allergic to.   Stress. SIGNS AND SYMPTOMS  Dry, scaly skin.   Red, itchy rash.   Itchiness. This may occur before the skin rash and may be very intense.  DIAGNOSIS  The diagnosis of eczema is usually made based on symptoms and medical history. TREATMENT  Eczema cannot be cured, but symptoms usually can be controlled with treatment and other strategies. A treatment plan might include:  Controlling the itching and scratching.   Use over-the-counter antihistamines as directed for itching. This is especially useful at night when the itching tends to be worse.   Use over-the-counter steroid creams as directed for itching.   Avoid scratching. Scratching makes the rash and itching worse. It may also result in a skin infection (impetigo) due to a break in the skin caused by scratching.   Keeping the skin well moisturized with creams every day. This will seal in moisture and help prevent dryness. Lotions that contain alcohol and water should be avoided because they can dry the skin.   Limiting exposure to things that you are sensitive or allergic to  (allergens).   Recognizing situations that cause stress.   Developing a plan to manage stress.  HOME CARE INSTRUCTIONS   Only take over-the-counter or prescription medicines as directed by your health care provider.   Do not use anything on the skin without checking with your health care provider.   Keep baths or showers short (5 minutes) in warm (not hot) water. Use mild cleansers for bathing. These should be unscented. You may add nonperfumed bath oil to the bath water. It is best to avoid soap and bubble bath.   Immediately after a bath or shower, when the skin is still damp, apply a moisturizing ointment to the entire body. This ointment should be a petroleum ointment. This will seal in moisture and help prevent dryness. The thicker the ointment, the better. These should be unscented.   Keep fingernails cut short. Children with eczema may need to wear soft gloves or mittens at night after applying an ointment.   Dress in clothes made of cotton or cotton blends. Dress lightly, because heat increases itching.   A child with eczema should stay away from anyone with fever blisters or cold sores. The virus that causes fever blisters (herpes simplex) can cause a serious skin infection in children with eczema. SEEK MEDICAL CARE IF:   Your itching interferes with sleep.   Your rash gets worse or is not better within 1 week after starting treatment.   You see pus or soft yellow scabs in the rash area.   You have a fever.   You have a rash  flare-up after contact with someone who has fever blisters.  Document Released: 02/07/2000 Document Revised: 11/30/2012 Document Reviewed: 09/12/2012 [facility] Patient Information 2015 [place], [place]. This information is not intended to replace advice given to you by your health care provider. Make sure you discuss any questions you have with your health care provider.

## 2014-07-13 NOTE — ED Notes (Signed)
Pt states she broke out into white hives on both of her forearms about 2 hours ago.  She states the arms turned red then broke out in hives.  She does not know what she may have come into contact with to cause the reaction.

## 2014-09-11 ENCOUNTER — Encounter: Payer: Self-pay | Admitting: Family Medicine

## 2014-09-12 ENCOUNTER — Encounter: Payer: Self-pay | Admitting: Family Medicine

## 2014-10-20 ENCOUNTER — Telehealth: Payer: 59 | Admitting: Family

## 2014-10-20 DIAGNOSIS — R05 Cough: Secondary | ICD-10-CM | POA: Diagnosis not present

## 2014-10-20 DIAGNOSIS — J208 Acute bronchitis due to other specified organisms: Secondary | ICD-10-CM

## 2014-10-20 DIAGNOSIS — J00 Acute nasopharyngitis [common cold]: Secondary | ICD-10-CM

## 2014-10-20 DIAGNOSIS — B9689 Other specified bacterial agents as the cause of diseases classified elsewhere: Secondary | ICD-10-CM

## 2014-10-20 DIAGNOSIS — R059 Cough, unspecified: Secondary | ICD-10-CM

## 2014-10-20 MED ORDER — BENZONATATE 100 MG PO CAPS: 100.0000 mg | ORAL_CAPSULE | Freq: Three times a day (TID) | ORAL | Status: DC | PRN

## 2014-10-20 MED ORDER — AZITHROMYCIN 250 MG PO TABS: ORAL_TABLET | ORAL | Status: DC

## 2014-10-20 NOTE — Progress Notes (Signed)

## 2014-10-31 ENCOUNTER — Ambulatory Visit: Payer: 59 | Admitting: Podiatry

## 2014-11-22 ENCOUNTER — Encounter: Payer: Self-pay | Admitting: Family Medicine

## 2014-11-26 ENCOUNTER — Ambulatory Visit: Payer: 59 | Admitting: Podiatry

## 2015-01-14 ENCOUNTER — Encounter: Payer: Self-pay | Admitting: Family Medicine

## 2015-01-14 ENCOUNTER — Ambulatory Visit (INDEPENDENT_AMBULATORY_CARE_PROVIDER_SITE_OTHER): Payer: 59 | Admitting: Family Medicine

## 2015-01-14 VITALS — BP 132/72 | HR 47 | Temp 97.8°F | Wt 125.5 lb

## 2015-01-14 DIAGNOSIS — R438 Other disturbances of smell and taste: Secondary | ICD-10-CM | POA: Insufficient documentation

## 2015-01-14 LAB — COMPREHENSIVE METABOLIC PANEL
ALBUMIN: 4.3 g/dL (ref 3.5–5.2)
ALT: 11 U/L (ref 0–35)
AST: 16 U/L (ref 0–37)
Alkaline Phosphatase: 47 U/L (ref 39–117)
BUN: 15 mg/dL (ref 6–23)
CHLORIDE: 104 meq/L (ref 96–112)
CO2: 27 mEq/L (ref 19–32)
Calcium: 9.1 mg/dL (ref 8.4–10.5)
Creatinine, Ser: 0.69 mg/dL (ref 0.40–1.20)
GFR: 102.98 mL/min (ref >60.00)
Glucose, Bld: 87 mg/dL (ref 70–99)
Potassium: 3.8 mEq/L (ref 3.5–5.1)
Sodium: 138 mEq/L (ref 135–145)
Total Bilirubin: 0.6 mg/dL (ref 0.2–1.2)
Total Protein: 7.1 g/dL (ref 6.0–8.3)

## 2015-01-14 LAB — TSH: TSH: 1.28 u[IU]/mL (ref 0.35–4.50)

## 2015-01-14 LAB — HEMOGLOBIN A1C: HEMOGLOBIN A1C: 5.3 % (ref 4.6–6.5)

## 2015-01-14 NOTE — Patient Instructions (Addendum)
Great to see you. I will call you with your lab results.  If everything is normal, let's try nightly nexium for 2 weeks.

## 2015-01-14 NOTE — Progress Notes (Signed)
Subjective:   Patient ID: [name], female    DOB: 01-26-1981, 34 y.o.   MRN: [id number]  [name] is a pleasant 34 y.o. year old female who presents to clinic today with metallic taste  on [id number]  HPI:  Metallic taste in her mouth- x 1 month. No changes to rxs or recent respiratory illnesses.  Just went to the dentist- no dental or gum infections. Denies any burning in throat.  Has not tried anything for it.  Does work long hours in [place].  Sometimes gets dizzy but feels better when she eats something.  Lab Results  Component Value Date   CREATININE 1.25* 06/20/2014   No current outpatient prescriptions on file prior to visit.   No current facility-administered medications on file prior to visit.    No Known Allergies  Past Medical History  Diagnosis Date  . Hyperlipidemia     DIET CONTROLLED  . Celiac disease   . Uterine prolapse 08/2009  . Basal cell carcinoma of nasolabial crease 2014  . SVD (spontaneous vaginal delivery)     X 2  . PONV (postoperative nausea and vomiting)     Past Surgical History  Procedure Laterality Date  . Tonsillectomy and adenoidectomy  1995  . Skin cancer excision      NOSE X 2  . Wisdom tooth extraction    . Vaginal hysterectomy N/A 01/24/2014    Procedure: HYSTERECTOMY VAGINAL;  Surgeon: Emily Filbert, MD;  Location: [facility];  Service: Gynecology;  Laterality: N/A;  . Bilateral salpingectomy Bilateral 01/24/2014    Procedure: BILATERAL SALPINGECTOMY;  Surgeon: Emily Filbert, MD;  Location: [facility];  Service: Gynecology;  Laterality: Bilateral;  . Cystocele repair N/A 01/24/2014    Procedure: ANTERIOR REPAIR (CYSTOCELE);  Surgeon: Emily Filbert, MD;  Location: [facility];  Service: Gynecology;  Laterality: N/A;    Family History  Problem Relation Age of Onset  . Hypothyroidism Mother   . Hyperlipidemia Father   . Celiac disease Sister   . Hashimoto's thyroiditis Sister   . Heart disease Paternal Grandfather 75    multiple CABG    . Hyperlipidemia Paternal Grandfather   . Cancer Paternal Grandmother     pancreatic cancer    Social History   Social History  . Marital Status: Married    Spouse Name: N/A  . Number of Children: N/A  . Years of Education: N/A   Occupational History  . Not on file.   Social History Main Topics  . Smoking status: Never Smoker   . Smokeless tobacco: Never Used  . Alcohol Use: Yes     Comment: occassionally  . Drug Use: No  . Sexual Activity:    Partners: Male    Birth Control/ Protection: None   Other Topics Concern  . 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   Married, 1 daughter named [name] (born 2011).   The PMH, PSH, Social History, Family History, Medications, and allergies have been reviewed in [facility], and have been updated if relevant.   Review of Systems  Constitutional: Negative.   HENT: Negative.   Eyes: Negative.   Respiratory: Negative.   Cardiovascular: Negative.   Endocrine: Negative.   Genitourinary: Negative.   Musculoskeletal: Negative.   Allergic/Immunologic: Negative.   Neurological: Positive for dizziness. Negative for tremors, seizures, syncope, facial asymmetry, speech difficulty, weakness, light-headedness, numbness and headaches.  Hematological:  Negative.   All other systems reviewed and are negative.      Objective:    BP 132/72 mmHg  Pulse 47  Temp(Src) 97.8 F (36.6 C) (Oral)  Wt 125 lb 8 oz (56.926 kg)  SpO2 99%  LMP 03/08/2013   Physical Exam  Constitutional: She is oriented to person, place, and time. She appears well-developed and well-nourished. No distress.  HENT:  Head: Normocephalic.  Right Ear: External ear normal.  Left Ear: External ear normal.  Eyes: Conjunctivae are normal.  Neck: Normal range of motion.  Cardiovascular: Normal rate and regular rhythm.   Pulmonary/Chest: Effort normal.  Musculoskeletal: Normal range of motion.   Neurological: She is alert and oriented to person, place, and time. No cranial nerve deficit.  Skin: Skin is warm and dry.  Psychiatric: She has a normal mood and affect. Her behavior is normal. Judgment and thought content normal.  Nursing note and vitals reviewed.         Assessment & Plan:   Metallic taste No Follow-up on file.

## 2015-01-14 NOTE — Progress Notes (Signed)
Pre visit review using our clinic review tool, if applicable. No additional management support is needed unless otherwise documented below in the visit note. 

## 2015-01-14 NOTE — Assessment & Plan Note (Signed)
New- Discussed different potential causes. Start work up with labs today. Given samples of nexium to try as well - ? Silent reflux- nightly for two weeks. Call or return to clinic prn if these symptoms worsen or fail to improve as anticipated. Consider ENT referral if persists. The patient indicates understanding of these issues and agrees with the plan. Orders Placed This Encounter  Procedures  . Hemoglobin A1c  . Comprehensive metabolic panel  . TSH

## 2015-01-15 ENCOUNTER — Encounter: Payer: Self-pay | Admitting: Family Medicine

## 2015-02-06 ENCOUNTER — Encounter: Payer: Self-pay | Admitting: Family Medicine

## 2015-04-01 ENCOUNTER — Encounter: Payer: Self-pay | Admitting: Family Medicine

## 2015-04-02 ENCOUNTER — Ambulatory Visit: Payer: 59 | Admitting: Obstetrics & Gynecology

## 2015-05-14 ENCOUNTER — Ambulatory Visit (INDEPENDENT_AMBULATORY_CARE_PROVIDER_SITE_OTHER): Payer: 59 | Admitting: Obstetrics & Gynecology

## 2015-05-14 ENCOUNTER — Encounter: Payer: Self-pay | Admitting: Obstetrics & Gynecology

## 2015-05-14 VITALS — BP 112/74 | HR 52 | Resp 16 | Ht 62.0 in | Wt 122.0 lb

## 2015-05-14 DIAGNOSIS — Z01419 Encounter for gynecological examination (general) (routine) without abnormal findings: Secondary | ICD-10-CM | POA: Diagnosis not present

## 2015-05-14 NOTE — Progress Notes (Signed)
Subjective:    [name] is a 35 y.o. MW P74 (25 and 92 month old kids) female who presents for an annual exam. The patient has no complaints today. She complains of a new hernia of her abdomen. The patient is sexually active. GYN screening history: last pap: was normal. The patient wears seatbelts: yes. The patient participates in regular exercise: yes. Has the patient ever been transfused or tattooed?: no. The patient reports that there is not domestic violence in her life.   Menstrual History: OB History    Gravida Para Term Preterm AB TAB SAB Ectopic Multiple Living   2 2 2       2       Menarche age: 59  Patient's last menstrual period was 03/08/2013.    The following portions of the patient's history were reviewed and updated as appropriate: allergies, current medications, past family history, past medical history, past social history, past surgical history and problem list.  Review of Systems Pertinent items noted in HPI and remainder of comprehensive ROS otherwise negative. She has had a flu vaccine, works for [employer]. Married for 8 years, denies dyspareunia.   Objective:    BP 112/74 mmHg  Pulse 52  Resp 16  Ht 5\' 2"  (1.575 m)  Wt 122 lb (55.339 kg)  BMI 22.31 kg/m2  LMP 03/08/2013  General Appearance:    Alert, cooperative, no distress, appears stated age  Head:    Normocephalic, without obvious abnormality, atraumatic  Eyes:    PERRL, conjunctiva/corneas clear, EOM's intact, fundi    benign, both eyes  Ears:    Normal TM's and external ear canals, both ears  Nose:   Nares normal, septum midline, mucosa normal, no drainage    or sinus tenderness  Throat:   Lips, mucosa, and tongue normal; teeth and gums normal  Neck:   Supple, symmetrical, trachea midline, no adenopathy;    thyroid:  no enlargement/tenderness/nodules; no carotid   bruit or JVD  Back:     Symmetric, no curvature, ROM normal, no CVA tenderness  Lungs:     Clear to auscultation bilaterally, respirations  unlabored  Chest Wall:    No tenderness or deformity   Heart:    Regular rate and rhythm, S1 and S2 normal, no murmur, rub   or gallop  Breast Exam:    No tenderness, masses, or nipple abnormality  Abdomen:     Soft, non-tender, bowel sounds active all four quadrants,    no masses, no organomegaly, rectus diathesis noted  Genitalia:    Normal female without lesion, discharge or tenderness, good vault support, normal adnexal exam     Extremities:   Extremities normal, atraumatic, no cyanosis or edema  Pulses:   2+ and symmetric all extremities  Skin:   Skin color, texture, turgor normal, no rashes or lesions  Lymph nodes:   Cervical, supraclavicular, and axillary nodes normal  Neurologic:   CNII-XII intact, normal strength, sensation and reflexes    throughout  .    Assessment:    Healthy female exam.   Rectus diathesis   Plan:     Breast self exam technique reviewed and patient encouraged to perform self-exam monthly.   Refer to plastic surgeon

## 2015-07-23 ENCOUNTER — Other Ambulatory Visit: Payer: Self-pay | Admitting: Family Medicine

## 2015-07-23 ENCOUNTER — Encounter: Payer: Self-pay | Admitting: Family Medicine

## 2015-07-23 DIAGNOSIS — R1011 Right upper quadrant pain: Secondary | ICD-10-CM

## 2015-07-24 ENCOUNTER — Ambulatory Visit (HOSPITAL_COMMUNITY)
Admission: RE | Admit: 2015-07-24 | Discharge: 2015-07-24 | Disposition: A | Discharge status: Home / Self Care | Payer: 59 | Source: Ambulatory Visit | Attending: Family Medicine | Admitting: Family Medicine

## 2015-07-24 DIAGNOSIS — R1011 Right upper quadrant pain: Secondary | ICD-10-CM | POA: Diagnosis not present

## 2015-08-02 DIAGNOSIS — M722 Plantar fascial fibromatosis: Secondary | ICD-10-CM | POA: Diagnosis not present

## 2015-09-04 DIAGNOSIS — H5213 Myopia, bilateral: Secondary | ICD-10-CM | POA: Diagnosis not present

## 2015-09-20 ENCOUNTER — Encounter: Payer: Self-pay | Admitting: Family Medicine

## 2015-11-01 ENCOUNTER — Encounter: Payer: Self-pay | Admitting: Family Medicine

## 2016-02-18 DIAGNOSIS — Z85828 Personal history of other malignant neoplasm of skin: Secondary | ICD-10-CM | POA: Diagnosis not present

## 2016-02-18 DIAGNOSIS — L814 Other melanin hyperpigmentation: Secondary | ICD-10-CM | POA: Diagnosis not present

## 2016-02-18 DIAGNOSIS — D485 Neoplasm of uncertain behavior of skin: Secondary | ICD-10-CM | POA: Diagnosis not present

## 2016-02-18 DIAGNOSIS — L821 Other seborrheic keratosis: Secondary | ICD-10-CM | POA: Diagnosis not present

## 2016-02-18 DIAGNOSIS — C44212 Basal cell carcinoma of skin of right ear and external auricular canal: Secondary | ICD-10-CM | POA: Diagnosis not present

## 2016-02-18 DIAGNOSIS — D225 Melanocytic nevi of trunk: Secondary | ICD-10-CM | POA: Diagnosis not present

## 2016-02-18 DIAGNOSIS — D1800 Hemangioma unspecified site: Secondary | ICD-10-CM | POA: Diagnosis not present

## 2016-02-18 DIAGNOSIS — L309 Dermatitis, unspecified: Secondary | ICD-10-CM | POA: Diagnosis not present

## 2016-02-18 DIAGNOSIS — Z23 Encounter for immunization: Secondary | ICD-10-CM | POA: Diagnosis not present

## 2016-02-18 DIAGNOSIS — Z86018 Personal history of other benign neoplasm: Secondary | ICD-10-CM | POA: Diagnosis not present

## 2016-03-19 ENCOUNTER — Telehealth: Payer: Self-pay | Admitting: Radiology

## 2016-03-19 NOTE — Telephone Encounter (Signed)
Spoke with [name] 03/19/16, attempted to schedule her Annual Exam with Dr Hulan Fray but unable to find a date that would work for her. Offered to schedule w another MD, she requested just to put it off til April. Put on waiting list to f/u with her in April.

## 2016-03-26 ENCOUNTER — Encounter: Payer: Self-pay | Admitting: Family Medicine

## 2016-04-01 ENCOUNTER — Encounter: Payer: Self-pay | Admitting: Family Medicine

## 2016-05-12 DIAGNOSIS — C44212 Basal cell carcinoma of skin of right ear and external auricular canal: Secondary | ICD-10-CM | POA: Diagnosis not present

## 2016-06-12 ENCOUNTER — Ambulatory Visit: Payer: 59 | Admitting: Obstetrics & Gynecology

## 2016-06-15 ENCOUNTER — Ambulatory Visit: Payer: 59 | Admitting: Obstetrics & Gynecology

## 2016-08-02 ENCOUNTER — Encounter (HOSPITAL_COMMUNITY): Payer: Self-pay | Admitting: Emergency Medicine

## 2016-08-02 ENCOUNTER — Ambulatory Visit (INDEPENDENT_AMBULATORY_CARE_PROVIDER_SITE_OTHER): Payer: 59

## 2016-08-02 ENCOUNTER — Ambulatory Visit (HOSPITAL_COMMUNITY)
Admission: EM | Admit: 2016-08-02 | Discharge: 2016-08-02 | Disposition: A | Discharge status: Home / Self Care | Payer: 59 | Attending: Internal Medicine | Admitting: Internal Medicine

## 2016-08-02 DIAGNOSIS — S60212A Contusion of left wrist, initial encounter: Secondary | ICD-10-CM | POA: Diagnosis not present

## 2016-08-02 DIAGNOSIS — M25532 Pain in left wrist: Secondary | ICD-10-CM | POA: Diagnosis not present

## 2016-08-02 NOTE — ED Provider Notes (Signed)
CSN: [id number]     Arrival date & time 08/02/16  1727 History   First MD Initiated Contact with Patient 08/02/16 1811     Chief Complaint  Patient presents with  . Wrist Pain   (Consider location/radiation/quality/duration/timing/severity/associated sxs/prior Treatment) HPI [name] is a 36 y.o. female presenting to UC with c/o 2/10 aching sore pain to anterior aspect of Left distal forearm over wrist and proximal hand from accidentally getting hit with a metal tent pole yesterday that flung back.  Pains is worse with grasping motions and trying to lift her young daughter.  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  She is Right hand dominant.  Denies being on blood thinners. No numbness to Left hand.    Past Medical History:  Diagnosis Date  . Basal cell carcinoma of nasolabial crease 2014  . Celiac disease   . Hyperlipidemia    DIET CONTROLLED  . PONV (postoperative nausea and vomiting)   . SVD (spontaneous vaginal delivery)    X 2  . Uterine prolapse 08/2009   Past Surgical History:  Procedure Laterality Date  . BILATERAL SALPINGECTOMY Bilateral 01/24/2014   Procedure: BILATERAL SALPINGECTOMY;  Surgeon: Emily Filbert, MD;  Location: [facility];  Service: Gynecology;  Laterality: Bilateral;  . CYSTOCELE REPAIR N/A 01/24/2014   Procedure: ANTERIOR REPAIR (CYSTOCELE);  Surgeon: Emily Filbert, MD;  Location: [facility];  Service: Gynecology;  Laterality: N/A;  . SKIN CANCER EXCISION     NOSE X 2  . TONSILLECTOMY AND ADENOIDECTOMY  1995  . VAGINAL HYSTERECTOMY N/A 01/24/2014   Procedure: HYSTERECTOMY VAGINAL;  Surgeon: Emily Filbert, MD;  Location: [facility];  Service: Gynecology;  Laterality: N/A;  . WISDOM TOOTH EXTRACTION     Family History  Problem Relation Age of Onset  . Hypothyroidism Mother   . Hyperlipidemia Father   . Celiac disease Sister   . Hashimoto's thyroiditis Sister   . Heart disease Paternal  Grandfather 68       multiple CABG  . Hyperlipidemia Paternal Grandfather   . Cancer Paternal Grandmother        pancreatic cancer   Social History  Substance Use Topics  . Smoking status: Never Smoker  . Smokeless tobacco: Never Used  . Alcohol use Yes     Comment: occassionally   OB History    Gravida Para Term Preterm AB Living   2 2 2     2    SAB TAB Ectopic Multiple Live Births           2     Review of Systems  Musculoskeletal: Positive for arthralgias, joint swelling and myalgias.  Skin: Positive for color change. Negative for wound.  Neurological: Positive for weakness ( mild in Left wrist due to pain). Negative for numbness.    Allergies  Patient has no known allergies.  Home Medications   Prior to Admission medications   Not on File   Meds Ordered and Administered this Visit  Medications - No data to display  BP 137/70 (BP Location: Right Arm)   Pulse 64   Temp 98.2 F (36.8 C) (Oral)   Resp 16   LMP 03/08/2013   SpO2 100%  No data found.   Physical Exam  Constitutional: She is oriented to person, place, and time. She appears well-developed and well-nourished.  HENT:  Head: Normocephalic and atraumatic.  Eyes: EOM are normal.  Neck: Normal range of motion.  Cardiovascular: Normal rate.   Pulmonary/Chest: Effort normal.  Musculoskeletal: Normal range of motion. She exhibits tenderness. She exhibits no edema.  Left wrist: full ROM. Tenderness to anterior aspect and radial aspect. Full ROM all fingers. No tenderness to hand, fingers, or snuff-box.   Neurological: She is alert and oriented to person, place, and time.  Skin: Skin is warm and dry.  Left wrist: skin in tact. 4-5cm area of ecchymosis. Tender. No induration or fluctuance.   Psychiatric: She has a normal mood and affect. Her behavior is normal.  Nursing note and vitals reviewed.   Urgent Care Course     Procedures (including critical care time)  Labs Review Labs Reviewed - No  data to display  Imaging Review Dg Wrist Complete Left  Result Date: 08/02/2016 CLINICAL DATA:  Left wrist pain. EXAM: LEFT WRIST - COMPLETE 3+ VIEW COMPARISON:  None. FINDINGS: There is no evidence of fracture or dislocation. There is no evidence of arthropathy or other focal bone abnormality. Soft tissues are unremarkable. IMPRESSION: No acute fracture or dislocation, left wrist. Electronically Signed   By: Ulyses Jarred M.D.   On: 08/02/2016 18:46    MDM   1. Contusion of left wrist, initial encounter    Doubt scaphoid fracture. No tenderness to snuff box.  MOI not c/w scaphoid fracture. Plain films unremarkable of acute bony injury.  Offered ace wrap or splint. Pt declined. Home care instructions provided F/u with PCP as needed.    Caresse, Sed[place]y, PA-C 08/02/16 1958

## 2016-08-02 NOTE — ED Triage Notes (Signed)
The patient presented to the [facility] with a complaint of left wrist pain, swelling and bruising secondary to it getting hit with a metal tent pole yesterday.

## 2016-08-21 ENCOUNTER — Ambulatory Visit (INDEPENDENT_AMBULATORY_CARE_PROVIDER_SITE_OTHER): Payer: 59 | Admitting: Obstetrics & Gynecology

## 2016-08-21 ENCOUNTER — Encounter: Payer: Self-pay | Admitting: Obstetrics & Gynecology

## 2016-08-21 VITALS — BP 106/69 | HR 68 | Ht 61.5 in | Wt 117.0 lb

## 2016-08-21 DIAGNOSIS — Z01419 Encounter for gynecological examination (general) (routine) without abnormal findings: Secondary | ICD-10-CM

## 2016-08-21 NOTE — Progress Notes (Signed)
Subjective:    [name] is a 36 y.o. MW P2 (2 daughters) female who presents for an annual exam. The patient has no complaints today. The patient is sexually active. GYN screening history: last pap: was normal. The patient wears seatbelts: yes. The patient participates in regular exercise: no. Has the patient ever been transfused or tattooed?: no. The patient reports that there is not domestic violence in her life.   Menstrual History: OB History    Gravida Para Term Preterm AB Living   2 2 2  0 0 2   SAB TAB Ectopic Multiple Live Births   0 0 0 0 2      Menarche age: 16 Patient's last menstrual period was 03/08/2013.    The following portions of the patient's history were reviewed and updated as appropriate: allergies, current medications, past family history, past medical history, past social history, past surgical history and problem list.  Review of Systems Pertinent items are noted in HPI.   Married to a surgical tech at [employer], She also works at [employer] FH-+ pancreas in [employer], + breast- paunt, + ovarian-pfirst cousin, no colon cancer   Objective:    BP 106/69   Pulse 68   Ht 5' 1.5" (1.562 m)   Wt 117 lb (53.1 kg)   LMP 03/08/2013   BMI 21.75 kg/m   General Appearance:    Alert, cooperative, no distress, appears stated age  Head:    Normocephalic, without obvious abnormality, atraumatic  Eyes:    PERRL, conjunctiva/corneas clear, EOM's intact, fundi    benign, both eyes  Ears:    Normal TM's and external ear canals, both ears  Nose:   Nares normal, septum midline, mucosa normal, no drainage    or sinus tenderness  Throat:   Lips, mucosa, and tongue normal; teeth and gums normal  Neck:   Supple, symmetrical, trachea midline, no adenopathy;    thyroid:  no enlargement/tenderness/nodules; no carotid   bruit or JVD  Back:     Symmetric, no curvature, ROM normal, no CVA tenderness  Lungs:     Clear to auscultation bilaterally, respirations unlabored  Chest Wall:    No  tenderness or deformity   Heart:    Regular rate and rhythm, S1 and S2 normal, no murmur, rub   or gallop  Breast Exam:    No tenderness, masses, or nipple abnormality  Abdomen:     Soft, non-tender, bowel sounds active all four quadrants,    no masses, no organomegaly, small umbilical hernia  Genitalia:    Normal female without lesion, discharge or tenderness, normal cuff, no masses or tenderness with bimanual exam     Extremities:   Extremities normal, atraumatic, no cyanosis or edema  Pulses:   2+ and symmetric all extremities  Skin:   Skin color, texture, turgor normal, no rashes or lesions  Lymph nodes:   Cervical, supraclavicular, and axillary nodes normal  Neurologic:   CNII-XII intact, normal strength, sensation and reflexes    throughout  .    Assessment:    Healthy female exam.    Plan:     My Risk

## 2016-09-23 ENCOUNTER — Ambulatory Visit: Payer: Self-pay | Admitting: Family Medicine

## 2016-09-30 ENCOUNTER — Other Ambulatory Visit (INDEPENDENT_AMBULATORY_CARE_PROVIDER_SITE_OTHER): Payer: 59 | Admitting: *Deleted

## 2016-09-30 ENCOUNTER — Encounter: Payer: Self-pay | Admitting: *Deleted

## 2016-09-30 DIAGNOSIS — Z803 Family history of malignant neoplasm of breast: Secondary | ICD-10-CM | POA: Diagnosis not present

## 2016-09-30 DIAGNOSIS — Z800 Family history of malignant neoplasm of digestive organs: Secondary | ICD-10-CM

## 2016-09-30 DIAGNOSIS — Z808 Family history of malignant neoplasm of other organs or systems: Secondary | ICD-10-CM | POA: Diagnosis not present

## 2016-09-30 DIAGNOSIS — Z8041 Family history of malignant neoplasm of ovary: Secondary | ICD-10-CM | POA: Diagnosis not present

## 2016-09-30 NOTE — Progress Notes (Signed)
Pt here for BRCA test - Sent to lab

## 2016-10-22 ENCOUNTER — Encounter: Payer: Self-pay | Admitting: Family Medicine

## 2016-11-10 ENCOUNTER — Encounter: Payer: Self-pay | Admitting: *Deleted

## 2016-12-20 ENCOUNTER — Encounter: Payer: Self-pay | Admitting: Family Medicine

## 2016-12-24 ENCOUNTER — Telehealth: Payer: Self-pay | Admitting: *Deleted

## 2016-12-24 ENCOUNTER — Ambulatory Visit (INDEPENDENT_AMBULATORY_CARE_PROVIDER_SITE_OTHER): Payer: 59 | Admitting: Podiatry

## 2016-12-24 ENCOUNTER — Encounter: Payer: Self-pay | Admitting: Podiatry

## 2016-12-24 ENCOUNTER — Ambulatory Visit (INDEPENDENT_AMBULATORY_CARE_PROVIDER_SITE_OTHER): Payer: 59

## 2016-12-24 VITALS — BP 114/70 | HR 58 | Resp 16

## 2016-12-24 DIAGNOSIS — S93491A Sprain of other ligament of right ankle, initial encounter: Secondary | ICD-10-CM

## 2016-12-24 DIAGNOSIS — M722 Plantar fascial fibromatosis: Secondary | ICD-10-CM

## 2016-12-24 DIAGNOSIS — S93601A Unspecified sprain of right foot, initial encounter: Secondary | ICD-10-CM | POA: Diagnosis not present

## 2016-12-24 NOTE — Progress Notes (Signed)
Subjective:  Patient ID: [name], female    DOB: [birth year],  MRN: [id number] HPI Chief Complaint  Patient presents with  . 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 female presents with the above complaint.   She states she's been seen by orthopedics on several occasions been told that his plantar fasciitis radiographs have never been performed before was placed in CAM boot. She states that nothing seems to be working the pain is not at the typical plantar fascial insertion site. But feels that this is affecting her ability to perform her everyday activities. She states that she's also had orthotics which did not help at all.  Past Medical History:  Diagnosis Date  . Basal cell carcinoma of nasolabial crease 2014  . Celiac disease   . Hyperlipidemia    DIET CONTROLLED  . PONV (postoperative nausea and vomiting)   . SVD (spontaneous vaginal delivery)    X 2  . Uterine prolapse 08/2009   Past Surgical History:  Procedure Laterality Date  . BILATERAL SALPINGECTOMY Bilateral 01/24/2014   Procedure: BILATERAL SALPINGECTOMY;  Surgeon: Emily Filbert, MD;  Location: [facility];  Service: Gynecology;  Laterality: Bilateral;  . CYSTOCELE REPAIR N/A 01/24/2014   Procedure: ANTERIOR REPAIR (CYSTOCELE);  Surgeon: Emily Filbert, MD;  Location: [facility];  Service: Gynecology;  Laterality: N/A;  . SKIN CANCER EXCISION     NOSE X 2  . TONSILLECTOMY AND ADENOIDECTOMY  1995  . VAGINAL HYSTERECTOMY N/A 01/24/2014   Procedure: HYSTERECTOMY VAGINAL;  Surgeon: Emily Filbert, MD;  Location: [facility];  Service: Gynecology;  Laterality: N/A;  . WISDOM TOOTH EXTRACTION      Current Outpatient Prescriptions:  .  esomeprazole (NEXIUM) 40 MG capsule, Take 40 mg by mouth daily at 12 noon., Disp: , Rfl:   No Known Allergies Review  of Systems  All other systems reviewed and are negative.  Objective:   Vitals:   12/24/16 0916  BP: 114/70  Pulse: (!) 58  Resp: 16    General: Well developed, nourished, in no acute distress, alert and oriented x3   Dermatological: Skin is warm, dry and supple bilateral. Nails x 10 are well maintained; remaining integument appears unremarkable at this time. There are no open sores, no preulcerative lesions, no rash or signs of infection present.  Vascular: Dorsalis Pedis artery and Posterior Tibial artery pedal pulses are 2/4 bilateral with immedate capillary fill time. Pedal hair growth present. No varicosities and no lower extremity edema present bilateral.   Neruologic: Grossly intact via light touch bilateral. Vibratory intact via tuning fork bilateral. Protective threshold with Semmes Wienstein monofilament intact to all pedal sites bilateral. Patellar and Achilles deep tendon reflexes 2+ bilateral. No Babinski or clonus noted bilateral.   Musculoskeletal: No gross boney pedal deformities bilateral. No pain, crepitus, or limitation noted with foot and ankle range of motion bilateral. Muscular strength 5/5 in all groups tested bilateral. She has pain on palpation just superior to the medial calcaneal tubercle. The pain is more in the area of the talonavicular joint area of the spring ligament she has frontal plane range of motion pain but she does have pain on direct palpation of the porta pedis without Tinel sign.  Gait: Unassisted, Nonantalgic.  Radiographs:  Radiographs demonstrate relatively normal osseous architecture no significant findings in the area of complaint. No fractures are identified no significant arthropathies. No significant soft tissue edema.  Assessment & Plan:   Assessment: Probable tear of the deep ligament possible fibers from the posterior tibial tendon or spring ligament.  Plan: Since CONSERVATIVE therapies have failed in the past I feel it best to  have an MRI performed to evaluate the deep structures. I'll follow-up with her to discuss her options once her MRI results are in.     Dillan Candela T. [place], [place]

## 2016-12-24 NOTE — Telephone Encounter (Signed)
-----   Message from Rip Harbour, [facility] sent at 12/24/2016  9:48 AM EDT ----- Regarding: MRI MRI foot and rearfoot right - evaluate tear of spring ligament right - surgical consideration

## 2016-12-24 NOTE — Telephone Encounter (Signed)
I spoke with [facility] and he stated rearfoot = ankle views. Orders

## 2017-01-05 ENCOUNTER — Ambulatory Visit
Admission: RE | Admit: 2017-01-05 | Discharge: 2017-01-05 | Disposition: A | Discharge status: Home / Self Care | Payer: 59 | Source: Ambulatory Visit | Attending: Podiatry | Admitting: Podiatry

## 2017-01-05 DIAGNOSIS — M79671 Pain in right foot: Secondary | ICD-10-CM | POA: Diagnosis not present

## 2017-01-05 DIAGNOSIS — M25571 Pain in right ankle and joints of right foot: Secondary | ICD-10-CM | POA: Diagnosis not present

## 2017-01-06 ENCOUNTER — Telehealth: Payer: Self-pay | Admitting: *Deleted

## 2017-01-06 NOTE — Telephone Encounter (Signed)
Mailed copy of MRI Disc to [facility]. I informed pt Dr.Hyatt had reviewed the MRI results and wanted to send to a radiology specialist for a more in-depth reading for treatment planning and there would be a 7-10 day delay in final results. Pt states understanding and would like to keep her 01/19/2017 appt, because she is going to [place] and would like to know if she needed a boot or what she would be able to do.

## 2017-01-06 NOTE — Telephone Encounter (Signed)
-----   Message from Garrel Ridgel, [place] sent at 01/06/2017 12:36 PM EST ----- Please send for an over read and inform patient of the delay.

## 2017-01-13 ENCOUNTER — Encounter: Payer: Self-pay | Admitting: Podiatry

## 2017-01-19 ENCOUNTER — Encounter: Payer: Self-pay | Admitting: Podiatry

## 2017-01-19 ENCOUNTER — Ambulatory Visit: Payer: 59 | Admitting: Podiatry

## 2017-01-19 DIAGNOSIS — M76821 Posterior tibial tendinitis, right leg: Secondary | ICD-10-CM

## 2017-01-19 DIAGNOSIS — S93491A Sprain of other ligament of right ankle, initial encounter: Secondary | ICD-10-CM | POA: Diagnosis not present

## 2017-01-20 ENCOUNTER — Telehealth: Payer: Self-pay | Admitting: *Deleted

## 2017-01-20 DIAGNOSIS — S93491A Sprain of other ligament of right ankle, initial encounter: Secondary | ICD-10-CM

## 2017-01-20 DIAGNOSIS — M76821 Posterior tibial tendinitis, right leg: Secondary | ICD-10-CM

## 2017-01-20 NOTE — Telephone Encounter (Signed)
Orders faxed to [facility] - main Scheduling.

## 2017-01-20 NOTE — Telephone Encounter (Signed)
-----   Message from Rip Harbour, [facility] sent at 01/19/2017  4:12 PM EST ----- Regarding: Ultrasound Schedule patient for -   Dynamic Ultrasound right foot and ankle - evaluate medial posterior tibial FHL and FDC tendon and spring ligament right   [facility]

## 2017-01-20 NOTE — Progress Notes (Signed)
She presents today for follow-up of her MRI and over read regarding her right foot pain. She states that if anything is worse wearing the boot.  Objective: Vital signs are stable alert and oriented 3. Pulses are palpable. She still has pain inferior to the posterior tibial tendon and inferior to the navicular tuberosity. It extends from the posterior aspect of the subtalar joint along the sustentaculum tali.  MRI states mild distal posterior tendinopathy and some possible osteoarthritic changes talonavicular joint dorsally possible old but mild sprain of the spring ligament portion of the deltoid ligament.  Also had Dr. Jacqualyn Posey to evaluate this patient he feels that a dynamic ultrasound may be best for her to evaluate the tendons and the areas of symptomatology.  Assessment currently diagnosis is posterior tibial tendinitis  Plan: We are requesting radiology for a dynamic ultrasound to better visualize the posterior tibial tendon and the lesser hallucis longus tendon as well as the flexor digitorum longus tendon.

## 2017-01-22 ENCOUNTER — Ambulatory Visit (HOSPITAL_COMMUNITY): Payer: 59

## 2017-01-25 ENCOUNTER — Ambulatory Visit (HOSPITAL_COMMUNITY)
Admission: RE | Admit: 2017-01-25 | Discharge: 2017-01-25 | Disposition: A | Discharge status: Home / Self Care | Payer: 59 | Source: Ambulatory Visit | Attending: Podiatry | Admitting: Podiatry

## 2017-01-25 DIAGNOSIS — X58XXXA Exposure to other specified factors, initial encounter: Secondary | ICD-10-CM | POA: Diagnosis not present

## 2017-01-25 DIAGNOSIS — S93491A Sprain of other ligament of right ankle, initial encounter: Secondary | ICD-10-CM | POA: Diagnosis not present

## 2017-01-25 DIAGNOSIS — M79671 Pain in right foot: Secondary | ICD-10-CM | POA: Diagnosis not present

## 2017-01-25 DIAGNOSIS — M76821 Posterior tibial tendinitis, right leg: Secondary | ICD-10-CM | POA: Diagnosis not present

## 2017-01-26 ENCOUNTER — Telehealth: Payer: Self-pay

## 2017-01-26 NOTE — Telephone Encounter (Signed)
-----   Message from Garrel Ridgel, [place] sent at 01/26/2017  7:16 AM EST ----- Tell her that it was normal and we will talk once she gets back from her trip.

## 2017-01-26 NOTE — Telephone Encounter (Signed)
Spoke with patient informing her of normal [place] results and to follow up with Dr Milinda Pointer for further discussion

## 2017-02-09 DIAGNOSIS — L814 Other melanin hyperpigmentation: Secondary | ICD-10-CM | POA: Diagnosis not present

## 2017-02-09 DIAGNOSIS — Z85828 Personal history of other malignant neoplasm of skin: Secondary | ICD-10-CM | POA: Diagnosis not present

## 2017-02-09 DIAGNOSIS — L821 Other seborrheic keratosis: Secondary | ICD-10-CM | POA: Diagnosis not present

## 2017-02-09 DIAGNOSIS — D1801 Hemangioma of skin and subcutaneous tissue: Secondary | ICD-10-CM | POA: Diagnosis not present

## 2017-04-19 DIAGNOSIS — D225 Melanocytic nevi of trunk: Secondary | ICD-10-CM | POA: Diagnosis not present

## 2017-04-19 DIAGNOSIS — D485 Neoplasm of uncertain behavior of skin: Secondary | ICD-10-CM | POA: Diagnosis not present

## 2017-04-22 ENCOUNTER — Other Ambulatory Visit: Payer: Self-pay | Admitting: General Surgery

## 2017-04-22 DIAGNOSIS — D1809 Hemangioma of other sites: Secondary | ICD-10-CM | POA: Diagnosis not present

## 2017-05-31 ENCOUNTER — Encounter: Payer: Self-pay | Admitting: Radiology

## 2017-06-14 ENCOUNTER — Encounter: Payer: Self-pay | Admitting: Family Medicine

## 2017-06-14 ENCOUNTER — Ambulatory Visit (INDEPENDENT_AMBULATORY_CARE_PROVIDER_SITE_OTHER): Payer: 59

## 2017-06-14 ENCOUNTER — Ambulatory Visit (INDEPENDENT_AMBULATORY_CARE_PROVIDER_SITE_OTHER): Payer: 59 | Admitting: Family Medicine

## 2017-06-14 VITALS — BP 114/58 | HR 88 | Temp 98.8°F | Ht 62.5 in | Wt 122.0 lb

## 2017-06-14 DIAGNOSIS — E785 Hyperlipidemia, unspecified: Secondary | ICD-10-CM | POA: Diagnosis not present

## 2017-06-14 DIAGNOSIS — M5412 Radiculopathy, cervical region: Secondary | ICD-10-CM

## 2017-06-14 DIAGNOSIS — Z0000 Encounter for general adult medical examination without abnormal findings: Secondary | ICD-10-CM

## 2017-06-14 DIAGNOSIS — M542 Cervicalgia: Secondary | ICD-10-CM | POA: Diagnosis not present

## 2017-06-14 NOTE — Patient Instructions (Signed)
Great to see you.  We will call you with your lab and xray results.

## 2017-06-14 NOTE — Progress Notes (Signed)
Subjective:   Patient ID: [name], female    DOB: May 05, 1980, 37 y.o.   MRN: [id number]  [name] is a pleasant 37 y.o. year old female who presents to clinic today with Annual Exam (Patient is here today for a CPE without PAP.  She sees Dr. Hulan Fray for bimanual exams and is without cervix.  She is not currently fasting but would like to be able to get her labs drawn at [facility] within the next 2d.  Immunizations are UTD.)  on 06/14/2017  HPI:  Health Maintenance  Topic Date Due  . INFLUENZA VACCINE  09/23/2017  . TETANUS/TDAP  09/16/2023  . HIV Screening  Completed  . PAP SMEAR  Discontinued   1. Anticipatory guidance: Patient counseled regarding regular dental exams -q6 months, eye exams -twice a year right now, wearing seatbelts.  2. Risk factor reduction:  Advised patient of need for regular exercise and diet rich and fruits and vegetables to reduce risk of heart attack and stroke. Exercise- no regular exercise but she is quite active on her job.  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 3.Pap smear- done by Dr. Hulan Fray last but she has had a hysterectomy. 4. H/o skin cancer- saw dermatology last month- goes twice yearly.  Left shoulder tingling and pain- used to come and go after a long case in the OR.  Now has it all the time.  No decreased grip strength but shoulder does feel weaker after she has been holding surgical instruments for long periods of time. Current Outpatient Medications on File Prior to Visit  Medication Sig Dispense Refill  . esomeprazole (NEXIUM) 40 MG capsule Take 40 mg by mouth daily at 12 noon.     No current facility-administered medications on file prior to visit.     No Known Allergies  Past Medical History:  Diagnosis Date  . Basal cell carcinoma of nasolabial crease 2014  . Celiac disease   . Hyperlipidemia    DIET CONTROLLED  . PONV (postoperative nausea and vomiting)   . SVD (spontaneous vaginal delivery)    X 2  .  Uterine prolapse 08/2009    Past Surgical History:  Procedure Laterality Date  . BILATERAL SALPINGECTOMY Bilateral 01/24/2014   Procedure: BILATERAL SALPINGECTOMY;  Surgeon: Emily Filbert, MD;  Location: [facility];  Service: Gynecology;  Laterality: Bilateral;  . CYSTOCELE REPAIR N/A 01/24/2014   Procedure: ANTERIOR REPAIR (CYSTOCELE);  Surgeon: Emily Filbert, MD;  Location: [facility];  Service: Gynecology;  Laterality: N/A;  . SKIN CANCER EXCISION     NOSE X 2  . TONSILLECTOMY AND ADENOIDECTOMY  1995  . VAGINAL HYSTERECTOMY N/A 01/24/2014   Procedure: HYSTERECTOMY VAGINAL;  Surgeon: Emily Filbert, MD;  Location: [facility];  Service: Gynecology;  Laterality: N/A;  . WISDOM TOOTH EXTRACTION      Family History  Problem Relation Age of Onset  . Hypothyroidism Mother   . Hyperlipidemia Father   . Celiac disease Sister   . Hashimoto's thyroiditis Sister   . Heart disease Paternal Grandfather 57       multiple CABG  . Hyperlipidemia Paternal Grandfather   . Cancer Paternal Grandmother        pancreatic cancer    Social History   Socioeconomic History  . Marital status: Married    Spouse name: Not on file  . Number of children: Not on file  . Years of education: Not on file  . Highest education level: Not  on file  Occupational History  . Not on file  Social Needs  . Financial resource strain: Not on file  . Food insecurity:    Worry: Not on file    Inability: Not on file  . Transportation needs:    Medical: Not on file    Non-medical: Not on file  Tobacco Use  . Smoking status: Never Smoker  . Smokeless tobacco: Never Used  Substance and Sexual Activity  . Alcohol use: Yes    Comment: occassionally  . Drug use: No  . Sexual activity: Yes    Partners: Male    Birth control/protection: None  Lifestyle  . Physical activity:    Days per week: Not on file    Minutes per session: Not on file  . Stress: Not on file  Relationships  . Social connections:    Talks on phone: Not on file      Gets together: Not on file    Attends religious service: Not on file    Active member of club or organization: Not on file    Attends meetings of clubs or organizations: Not on file    Relationship status: Not on file  . Intimate partner violence:    Fear of current or ex partner: Not on file    Emotionally abused: Not on file    Physically abused: Not on file    Forced sexual activity: Not on file  Other Topics Concern  . 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   Recently moved here from [facility].   Married, 1 daughter named [name] (born 2011).   The PMH, PSH, Social History, Family History, Medications, and allergies have been reviewed in [facility], and have been updated if relevant.      Review of Systems  Constitutional: Negative.   HENT: Negative.   Eyes: Negative.   Respiratory: Negative.   Cardiovascular: Negative.   Gastrointestinal: Negative.   Endocrine: Negative.   Genitourinary: Negative.   Musculoskeletal: Negative.   Neurological: Positive for numbness. Negative for dizziness and weakness.  Hematological: Negative.   Psychiatric/Behavioral: Negative.   All other systems reviewed and are negative.      Objective:    BP (!) 114/58 (BP Location: Left Arm, Patient Position: Sitting, Cuff Size: Normal)   Pulse 88   Temp 98.8 F (37.1 C) (Oral)   Ht 5' 2.5" (1.588 m)   Wt 122 lb (55.3 kg)   LMP 03/08/2013   SpO2 98%   BMI 21.96 kg/m    Physical Exam   General:  Well-developed,well-nourished,in no acute distress; alert,appropriate and cooperative throughout examination Head:  normocephalic and atraumatic.   Eyes:  vision grossly intact, PERRL Ears:  R ear normal and L ear normal externally, TMs clear bilaterally Nose:  no external deformity.   Mouth:  good dentition.   Neck:  No deformities, masses, or tenderness noted. Lungs:  Normal respiratory effort, chest expands symmetrically. Lungs are clear to auscultation,  no crackles or wheezes. Heart:  Normal rate and regular rhythm. S1 and S2 normal without gallop, murmur, click, rub or other extra sounds. Abdomen:  Bowel sounds positive,abdomen soft and non-tender without masses, organomegaly or hernias noted. Msk:  No deformity or scoliosis noted of thoracic or lumbar spine.   Normal grip strength bilaterally, + cervical impingement symptoms Extremities:  No clubbing, cyanosis, edema, or deformity noted with normal full range of motion of all joints.   Neurologic:  alert & oriented  X3 and gait normal.   Skin:  Intact without suspicious lesions or rashes Cervical Nodes:  No lymphadenopathy noted Axillary Nodes:  No palpable lymphadenopathy Psych:  Cognition and judgment appear intact. Alert and cooperative with normal attention span and concentration. No apparent delusions, illusions, hallucinations        Assessment & Plan:   Hyperlipidemia, unspecified hyperlipidemia type - Plan: Comprehensive metabolic panel, Lipid panel, TSH  Well woman exam without gynecological exam  Cervical radiculopathy at C5 - Plan: DG Cervical Spine Complete, Ambulatory referral to Physical Therapy, Ambulatory referral to Neurosurgery No follow-ups on file.

## 2017-06-14 NOTE — Assessment & Plan Note (Addendum)
Cervical xray today. Will likely need MRI. Refer to neurosurgery, PT.

## 2017-06-14 NOTE — Assessment & Plan Note (Signed)
Reviewed preventive care protocols, scheduled due services, and updated immunizations Discussed nutrition, exercise, diet, and healthy lifestyle.  Orders Placed This Encounter  Procedures  . DG Cervical Spine Complete  . Comprehensive metabolic panel  . Lipid panel  . TSH  . Ambulatory referral to Physical Therapy  . Ambulatory referral to Neurosurgery

## 2017-06-14 NOTE — Assessment & Plan Note (Signed)
Sees dermatology twice yearly. UTD on screening.

## 2017-06-15 ENCOUNTER — Other Ambulatory Visit (INDEPENDENT_AMBULATORY_CARE_PROVIDER_SITE_OTHER): Payer: 59

## 2017-06-15 DIAGNOSIS — E785 Hyperlipidemia, unspecified: Secondary | ICD-10-CM | POA: Diagnosis not present

## 2017-06-15 LAB — TSH: TSH: 1.18 u[IU]/mL (ref 0.35–4.50)

## 2017-06-15 LAB — COMPREHENSIVE METABOLIC PANEL
ALBUMIN: 4.2 g/dL (ref 3.5–5.2)
ALT: 11 U/L (ref 0–35)
AST: 18 U/L (ref 0–37)
Alkaline Phosphatase: 40 U/L (ref 39–117)
BUN: 20 mg/dL (ref 6–23)
CHLORIDE: 106 meq/L (ref 96–112)
CO2: 27 meq/L (ref 19–32)
Calcium: 9 mg/dL (ref 8.4–10.5)
Creatinine, Ser: 0.72 mg/dL (ref 0.40–1.20)
GFR: 96.72 mL/min (ref >60.00)
Glucose, Bld: 84 mg/dL (ref 70–99)
Potassium: 3.7 mEq/L (ref 3.5–5.1)
SODIUM: 138 meq/L (ref 135–145)
Total Bilirubin: 0.4 mg/dL (ref 0.2–1.2)
Total Protein: 6.8 g/dL (ref 6.0–8.3)

## 2017-06-15 LAB — LIPID PANEL
CHOL/HDL RATIO: 3
CHOLESTEROL: 154 mg/dL (ref 0–200)
HDL: 57.6 mg/dL (ref >39.00)
LDL CALC: 89 mg/dL (ref 0–99)
NonHDL: 96.41
Triglycerides: 35 mg/dL (ref 0.0–149.0)
VLDL: 7 mg/dL (ref 0.0–40.0)

## 2017-06-21 ENCOUNTER — Encounter: Payer: Self-pay | Admitting: Family Medicine

## 2017-06-22 ENCOUNTER — Other Ambulatory Visit: Payer: Self-pay | Admitting: Family Medicine

## 2017-06-22 DIAGNOSIS — J302 Other seasonal allergic rhinitis: Secondary | ICD-10-CM

## 2017-06-22 NOTE — Progress Notes (Signed)
l °

## 2017-06-29 ENCOUNTER — Other Ambulatory Visit: Payer: Self-pay

## 2017-06-29 ENCOUNTER — Encounter: Payer: Self-pay | Admitting: Physical Therapy

## 2017-06-29 ENCOUNTER — Ambulatory Visit: Payer: 59 | Attending: Family Medicine | Admitting: Physical Therapy

## 2017-06-29 DIAGNOSIS — M62838 Other muscle spasm: Secondary | ICD-10-CM | POA: Insufficient documentation

## 2017-06-29 DIAGNOSIS — M542 Cervicalgia: Secondary | ICD-10-CM | POA: Insufficient documentation

## 2017-06-29 NOTE — Therapy (Signed)
[place], [place], [address] Phone: [phone]   Fax:  [phone]  Physical Therapy Evaluation  Patient Details  Name: [name] MRN: [id number] Date of Birth: Nov 04, 1980 Referring Provider: Deborra Medina   Encounter Date: 06/29/2017  PT End of Session - 06/29/17 1753    Visit Number  1    Date for PT Re-Evaluation  08/29/17    PT Start Time  0165    PT Stop Time  1704    PT Time Calculation (min)  50 min    Activity Tolerance  Patient tolerated treatment well    Behavior During Therapy  [facility] for tasks assessed/performed       Past Medical History:  Diagnosis Date  . Basal cell carcinoma of nasolabial crease 2014  . Celiac disease   . Hyperlipidemia    DIET CONTROLLED  . PONV (postoperative nausea and vomiting)   . SVD (spontaneous vaginal delivery)    X 2  . Uterine prolapse 08/2009    Past Surgical History:  Procedure Laterality Date  . BILATERAL SALPINGECTOMY Bilateral 01/24/2014   Procedure: BILATERAL SALPINGECTOMY;  Surgeon: Emily Filbert, MD;  Location: [facility];  Service: Gynecology;  Laterality: Bilateral;  . CYSTOCELE REPAIR N/A 01/24/2014   Procedure: ANTERIOR REPAIR (CYSTOCELE);  Surgeon: Emily Filbert, MD;  Location: [facility];  Service: Gynecology;  Laterality: N/A;  . SKIN CANCER EXCISION     NOSE X 2  . TONSILLECTOMY AND ADENOIDECTOMY  1995  . VAGINAL HYSTERECTOMY N/A 01/24/2014   Procedure: HYSTERECTOMY VAGINAL;  Surgeon: Emily Filbert, MD;  Location: [facility];  Service: Gynecology;  Laterality: N/A;  . WISDOM TOOTH EXTRACTION      There were no vitals filed for this visit.   Subjective Assessment - 06/29/17 1618    Subjective  Patient reports that she has had neck pain and some left shoulder pain for about a year.  Works in the [employer] she is in a forward head position most of the day.  x-rasy showed the loss of lordosis.      Limitations  Lifting;House hold activities    Patient Stated  Goals  have less pain and buring    Currently in Pain?  Yes    Pain Score  6     Pain Location  Neck    Pain Orientation  Left    Pain Descriptors / Indicators  Aching;Burning    Pain Type  Acute pain    Pain Radiating Towards  into the top of the left shoulder    Pain Onset  More than a month ago    Pain Frequency  Constant    Aggravating Factors   looking down, lying on the left, lifting child, end of day, trying to sleep, pain a 6/10    Pain Relieving Factors  reports nothing helps    Effect of Pain on Daily Activities  just burns, "uncomfortable         [facility] PT Assessment - 06/29/17 0001      Assessment   Medical Diagnosis  left cervical radiculopathy    Referring Provider  Deborra Medina    Onset Date/Surgical Date  05/30/17    Hand Dominance  Right    Prior Therapy  no      Precautions   Precautions  None      Balance Screen   Has the patient fallen in the past 6 months  No  Has the patient had a decrease in activity level because of a fear of falling?   No    Is the patient reluctant to leave their home because of a fear of falling?   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      Posture/Postural Control   Posture Comments  fwd head, rounded shoulder posture      ROM / Strength   AROM / PROM / Strength  AROM;Strength      AROM   Overall AROM Comments  Cervical ROM is WNL's some pain with flexion and bilateral side bending, shoulders WNL's      Strength   Overall Strength Comments  WFL's without pain      Palpation   Palpation comment  she is tight and tender in the upper trap, levator and cervical parapsinals, she has a small trigger point that refers her pain to the leftt shoulder in the upper trap                 Objective measurements completed on examination: See above findings.      [facility] Adult PT Treatment/Exercise - 06/29/17 0001      Modalities   Modalities  Electrical Stimulation      Electrical Stimulation   Electrical Stimulation Location  left upper trap area    Electrical Stimulation Action  estim/ultrasound combo    Electrical Stimulation Parameters  sitting    Electrical Stimulation Goals  Pain      Manual Therapy   Manual Therapy  Taping    Kinesiotex  Create Space      Kinesiotix   Create Space  over the small trigger point in the left upper trap             PT Education - 06/29/17 1752    Education provided  Yes    Education Details  gave HEp for cervical and scapular retraction, as well as shoulder shrugs, upper trap and levator stretch    Person(s) Educated  Patient       PT Short Term Goals - 06/29/17 1758      PT SHORT TERM GOAL #1   Title  independent with intial HEP    Time  2    Period  Weeks    Status  New        PT Long Term Goals - 06/29/17 1758      PT LONG TERM GOAL #1   Title  understand posture and body mechanics    Time  8    Period  Weeks    Status  New      PT LONG TERM GOAL #2   Title  decrease pain 50%    Time  8    Period  Weeks    Status  New      PT LONG TERM GOAL #3   Title  lift 37 year old without increase of pain    Time  8    Period  Weeks    Status  New             Plan - 06/29/17 1753    Clinical Impression  Statement  Patient reports having a buring in the left upper trap to the shoulder area for the past year, she is a PA in the [employer], she is in poor postural positions most of the day, a lot of head down, leaning forward and reach.  She has good ROM of the cervical spine and shoulders, she has good strength, the biggest issue is spasms in the upper trap, levator and cervical area, there is a small trigger point in the left upper trap that seems to replicate her pain     Clinical Presentation  Stable    Clinical Decision Making  Low    Rehab Potential  Good    PT Frequency  2x / week    PT Duration  8 weeks    PT Treatment/Interventions  ADLs/Self Care Home Management;Cryotherapy;Electrical Stimulation;Iontophoresis 4mg /ml Dexamethasone;Moist Heat;Traction;Ultrasound;Therapeutic exercise;Therapeutic activities;Patient/family education;Manual techniques;Dry needling;Taping    PT Next Visit Plan  see if todays treatment helped, start scapular stabilization, could try dry needling    Consulted and Agree with Plan of Care  Patient       Patient will benefit from skilled therapeutic intervention in order to improve the following deficits and impairments:  Decreased range of motion, Increased fascial restricitons, Increased muscle spasms, Pain, Improper body mechanics, Postural dysfunction  Visit Diagnosis: Cervicalgia - Plan: PT plan of care cert/re-cert  Other muscle spasm - Plan: PT plan of care cert/re-cert     Problem List Patient Active Problem List   Diagnosis Date Noted  . Cervical radiculopathy at C5 06/14/2017  . Well woman exam without gynecological exam 06/20/2014  . Skin cancer, basal cell 06/27/2013  . Hyperlipidemia   . Celiac disease     Sumner Boast., PT 06/29/2017, 6:00 PM  [place], [place], [address] Phone: [phone]   Fax:  [phone]  Name: [name] MRN: [id number] Date of Birth: [birth year]

## 2017-07-02 ENCOUNTER — Ambulatory Visit: Payer: 59 | Admitting: Physical Therapy

## 2017-07-02 DIAGNOSIS — M62838 Other muscle spasm: Secondary | ICD-10-CM | POA: Diagnosis not present

## 2017-07-02 DIAGNOSIS — M542 Cervicalgia: Secondary | ICD-10-CM

## 2017-07-02 NOTE — Therapy (Signed)
[place], [place], [address] Phone: [phone]   Fax:  [phone]  Physical Therapy Treatment  Patient Details  Name: [name] MRN: [id number] Date of Birth: 03-15-1980 Referring Provider: Deborra Medina   Encounter Date: 07/02/2017  PT End of Session - 07/02/17 1051    Visit Number  2    Date for PT Re-Evaluation  08/29/17    PT Start Time  1010    PT Stop Time  1050    PT Time Calculation (min)  40 min       Past Medical History:  Diagnosis Date  . Basal cell carcinoma of nasolabial crease 2014  . Celiac disease   . Hyperlipidemia    DIET CONTROLLED  . PONV (postoperative nausea and vomiting)   . SVD (spontaneous vaginal delivery)    X 2  . Uterine prolapse 08/2009    Past Surgical History:  Procedure Laterality Date  . BILATERAL SALPINGECTOMY Bilateral 01/24/2014   Procedure: BILATERAL SALPINGECTOMY;  Surgeon: Emily Filbert, MD;  Location: [facility];  Service: Gynecology;  Laterality: Bilateral;  . CYSTOCELE REPAIR N/A 01/24/2014   Procedure: ANTERIOR REPAIR (CYSTOCELE);  Surgeon: Emily Filbert, MD;  Location: [facility];  Service: Gynecology;  Laterality: N/A;  . SKIN CANCER EXCISION     NOSE X 2  . TONSILLECTOMY AND ADENOIDECTOMY  1995  . VAGINAL HYSTERECTOMY N/A 01/24/2014   Procedure: HYSTERECTOMY VAGINAL;  Surgeon: Emily Filbert, MD;  Location: [facility];  Service: Gynecology;  Laterality: N/A;  . WISDOM TOOTH EXTRACTION      There were no vitals filed for this visit.  Subjective Assessment - 07/02/17 1048    Subjective  doing stretches and HEP    Currently in Pain?  Yes    Pain Score  5     Pain Location  Neck    Pain Orientation  Left                       [facility] Adult PT Treatment/Exercise - 07/02/17 0001      Modalities   Modalities  Electrical Stimulation      Electrical Stimulation   Electrical Stimulation Location  left upper trap area    Electrical Stimulation  Action  estim/US combo    Electrical Stimulation Parameters  siting    Electrical Stimulation Goals  Pain      Manual Therapy   Manual Therapy  Soft tissue mobilization;Myofascial release;Passive ROM    Manual therapy comments  DTW to Left cerv/UT and rhom    Myofascial Release  cupping    Passive ROM  cerv with stretch    Kinesiotex  -- NE with tape per pt report               PT Short Term Goals - 07/02/17 1053      PT SHORT TERM GOAL #1   Title  independent with intial HEP    Status  Achieved        PT Long Term Goals - 06/29/17 1758      PT LONG TERM GOAL #1   Title  understand posture and body mechanics    Time  8    Period  Weeks    Status  New      PT LONG TERM GOAL #2   Title  decrease pain 50%    Time  8    Period  Weeks  Status  New      PT LONG TERM GOAL #3   Title  lift 37 year old without increase of pain    Time  8    Period  Weeks    Status  New            Plan - 07/02/17 1052    Clinical Impression Statement  responded and tolerated MT well for sigificant trigger pts in left cerv and rhom. educ on posture and stretches as able in OR    PT Next Visit Plan  DN ? STW  and cupping. scap stab ex       Patient will benefit from skilled therapeutic intervention in order to improve the following deficits and impairments:  Decreased range of motion, Increased fascial restricitons, Increased muscle spasms, Pain, Improper body mechanics, Postural dysfunction  Visit Diagnosis: Cervicalgia  Other muscle spasm     Problem List Patient Active Problem List   Diagnosis Date Noted  . Cervical radiculopathy at C5 06/14/2017  . Well woman exam without gynecological exam 06/20/2014  . Skin cancer, basal cell 06/27/2013  . Hyperlipidemia   . Celiac disease     [name],ANGIE PTA 07/02/2017, 10:53 AM  [place], [place], [address] Phone: [phone]    Fax:  [phone]  Name: [name] MRN: [id number] Date of Birth: 1980-05-29

## 2017-07-06 ENCOUNTER — Encounter: Payer: Self-pay | Admitting: Physical Therapy

## 2017-07-06 ENCOUNTER — Ambulatory Visit: Payer: 59 | Admitting: Physical Therapy

## 2017-07-06 DIAGNOSIS — M62838 Other muscle spasm: Secondary | ICD-10-CM

## 2017-07-06 DIAGNOSIS — M542 Cervicalgia: Secondary | ICD-10-CM

## 2017-07-06 NOTE — Therapy (Signed)
[place], [place], [address] Phone: [phone]   Fax:  [phone]  Physical Therapy Treatment  Patient Details  Name: [name] MRN: [id number] Date of Birth: [birth year] Referring Provider: Deborra Medina   Encounter Date: 07/06/2017  PT End of Session - 07/06/17 1645    Visit Number  3    Date for PT Re-Evaluation  08/29/17    PT Start Time  1600    PT Stop Time  1645    PT Time Calculation (min)  45 min    Activity Tolerance  Patient tolerated treatment well    Behavior During Therapy  [facility] for tasks assessed/performed       Past Medical History:  Diagnosis Date  . Basal cell carcinoma of nasolabial crease 2014  . Celiac disease   . Hyperlipidemia    DIET CONTROLLED  . PONV (postoperative nausea and vomiting)   . SVD (spontaneous vaginal delivery)    X 2  . Uterine prolapse 08/2009    Past Surgical History:  Procedure Laterality Date  . BILATERAL SALPINGECTOMY Bilateral 01/24/2014   Procedure: BILATERAL SALPINGECTOMY;  Surgeon: Emily Filbert, MD;  Location: [facility];  Service: Gynecology;  Laterality: Bilateral;  . CYSTOCELE REPAIR N/A 01/24/2014   Procedure: ANTERIOR REPAIR (CYSTOCELE);  Surgeon: Emily Filbert, MD;  Location: [facility];  Service: Gynecology;  Laterality: N/A;  . SKIN CANCER EXCISION     NOSE X 2  . TONSILLECTOMY AND ADENOIDECTOMY  1995  . VAGINAL HYSTERECTOMY N/A 01/24/2014   Procedure: HYSTERECTOMY VAGINAL;  Surgeon: Emily Filbert, MD;  Location: [facility];  Service: Gynecology;  Laterality: N/A;  . WISDOM TOOTH EXTRACTION      There were no vitals filed for this visit.  Subjective Assessment - 07/06/17 1601    Subjective  Pt reports that she feels better, "Less pull"    Currently in Pain?  No/denies    Pain Score  0-No pain                       [facility] Adult PT Treatment/Exercise - 07/06/17 0001      Exercises   Exercises  Shoulder      Shoulder Exercises:  Supine   Other Supine Exercises  Cervical retractions x10      Shoulder Exercises: Standing   External Rotation  10 reps;Theraband;Both    Theraband Level (Shoulder External Rotation)  Level 1 (Yellow)    Extension  Theraband;Both;10 reps x2    Row  Both;10 reps;Theraband x2    Theraband Level (Shoulder Row)  Level 3 (Green)      Shoulder Exercises: ROM/Strengthening   UBE (Upper Arm Bike)  L2 54frd/3rev      Electrical Stimulation   Electrical Stimulation Location  left upper trap area    Electrical Stimulation Parameters  sitting    Electrical Stimulation Goals  Pain      Manual Therapy   Manual Therapy  Soft tissue mobilization;Myofascial release;Passive ROM    Soft tissue mobilization  Posterior para spinales and upper traps     Passive ROM  cerv with stretch               PT Short Term Goals - 07/02/17 1053      PT SHORT TERM GOAL #1   Title  independent with intial HEP    Status  Achieved        PT Long Term  Goals - 06/29/17 1758      PT LONG TERM GOAL #1   Title  understand posture and body mechanics    Time  8    Period  Weeks    Status  New      PT LONG TERM GOAL #2   Title  decrease pain 50%    Time  8    Period  Weeks    Status  New      PT LONG TERM GOAL #3   Title  lift 37 year old without increase of pain    Time  8    Period  Weeks    Status  New            Plan - 07/06/17 1648    Clinical Impression Statement  Trigger points noted in both upper traps. Pt reports less pulling overall. positive response to e-Stim combo. Postural cues with standing exercises.    Rehab Potential  Good    PT Frequency  2x / week    PT Duration  8 weeks    PT Treatment/Interventions  ADLs/Self Care Home Management;Cryotherapy;Electrical Stimulation;Iontophoresis 4mg /ml Dexamethasone;Moist Heat;Traction;Ultrasound;Therapeutic exercise;Therapeutic activities;Patient/family education;Manual techniques;Dry needling;Taping    PT Next Visit Plan  DN ? STW   and cupping. scap stab ex       Patient will benefit from skilled therapeutic intervention in order to improve the following deficits and impairments:  Decreased range of motion, Increased fascial restricitons, Increased muscle spasms, Pain, Improper body mechanics, Postural dysfunction  Visit Diagnosis: Other muscle spasm  Cervicalgia     Problem List Patient Active Problem List   Diagnosis Date Noted  . Cervical radiculopathy at C5 06/14/2017  . Well woman exam without gynecological exam 06/20/2014  . Skin cancer, basal cell 06/27/2013  . Hyperlipidemia   . Celiac disease     Scot Jun, PTA 07/06/2017, 4:49 PM  [place], [place], [address] Phone: [phone]   Fax:  [phone]  Name: [name] MRN: [id number] Date of Birth: [birth year]

## 2017-07-15 ENCOUNTER — Ambulatory Visit: Payer: 59 | Admitting: Physical Therapy

## 2017-07-20 ENCOUNTER — Ambulatory Visit: Payer: 59 | Admitting: Physical Therapy

## 2017-07-20 ENCOUNTER — Encounter: Payer: Self-pay | Admitting: Physical Therapy

## 2017-07-20 DIAGNOSIS — M542 Cervicalgia: Secondary | ICD-10-CM | POA: Diagnosis not present

## 2017-07-20 DIAGNOSIS — M62838 Other muscle spasm: Secondary | ICD-10-CM

## 2017-07-20 NOTE — Therapy (Signed)
[place], [place], [address] Phone: [phone]   Fax:  [phone]  Physical Therapy Treatment  Patient Details  Name: [name] MRN: [id number] Date of Birth: 23-Jun-1980 Referring Provider: Deborra Medina   Encounter Date: 07/20/2017  PT End of Session - 07/20/17 1622    Visit Number  4    Date for PT Re-Evaluation  08/29/17    PT Start Time  1604    PT Stop Time  1700    PT Time Calculation (min)  56 min    Activity Tolerance  Patient tolerated treatment well    Behavior During Therapy  [facility] for tasks assessed/performed       Past Medical History:  Diagnosis Date  . Basal cell carcinoma of nasolabial crease 2014  . Celiac disease   . Hyperlipidemia    DIET CONTROLLED  . PONV (postoperative nausea and vomiting)   . SVD (spontaneous vaginal delivery)    X 2  . Uterine prolapse 08/2009    Past Surgical History:  Procedure Laterality Date  . BILATERAL SALPINGECTOMY Bilateral 01/24/2014   Procedure: BILATERAL SALPINGECTOMY;  Surgeon: Emily Filbert, MD;  Location: [facility];  Service: Gynecology;  Laterality: Bilateral;  . CYSTOCELE REPAIR N/A 01/24/2014   Procedure: ANTERIOR REPAIR (CYSTOCELE);  Surgeon: Emily Filbert, MD;  Location: [facility];  Service: Gynecology;  Laterality: N/A;  . SKIN CANCER EXCISION     NOSE X 2  . TONSILLECTOMY AND ADENOIDECTOMY  1995  . VAGINAL HYSTERECTOMY N/A 01/24/2014   Procedure: HYSTERECTOMY VAGINAL;  Surgeon: Emily Filbert, MD;  Location: [facility];  Service: Gynecology;  Laterality: N/A;  . WISDOM TOOTH EXTRACTION      There were no vitals filed for this visit.  Subjective Assessment - 07/20/17 1613    Subjective  Patient reports that there is less pulling, still doing the stretches but is reporting some burning in the left deltoid area, denies any thing going past the deltoid    Currently in Pain?  Yes    Pain Score  2     Pain Location  Shoulder    Pain Orientation   Left;Lateral                       [facility] Adult PT Treatment/Exercise - 07/20/17 0001      Shoulder Exercises: Supine   Other Supine Exercises  Cervical retractions x10      Shoulder Exercises: Seated   Other Seated Exercises  bent over rows, extension and reverse flies      Shoulder Exercises: Standing   External Rotation  20 reps;Theraband    Theraband Level (Shoulder External Rotation)  Level 2 (Red)    Extension  20 reps;Theraband    Theraband Level (Shoulder Extension)  Level 2 (Red)    Row  20 reps;Theraband    Theraband Level (Shoulder Row)  Level 2 (Red)      Shoulder Exercises: ROM/Strengthening   UBE (Upper Arm Bike)  L2 103fd/3rev      Modalities   Modalities  Electrical Stimulation;Traction      Electrical Stimulation   Electrical Stimulation Location  left upper trap area    Electrical Stimulation Action  IFC    Electrical Stimulation Parameters  supine    Electrical Stimulation Goals  Pain      Traction   Type of Traction  Cervical    Max (lbs)  12  Rest Time  static    Time  pa               PT Short Term Goals - 07/02/17 1053      PT SHORT TERM GOAL #1   Title  independent with intial HEP    Status  Achieved        PT Long Term Goals - 07/20/17 1632      PT LONG TERM GOAL #1   Title  understand posture and body mechanics    Status  Partially Met      PT LONG TERM GOAL #2   Title  decrease pain 50%    Status  On-going            Plan - 07/20/17 1627    Clinical Impression Statement  She still has a knot in the left upper trap but it is not causing her pain now, with the exercises she has an increased burning in the deltoid area.  Tried traction today    PT Next Visit Plan  tried traction due to the burning in the left deltoid area, see how this did with her pain    Consulted and Agree with Plan of Care  Patient       Patient will benefit from skilled therapeutic intervention in order to improve the  following deficits and impairments:  Decreased range of motion, Increased fascial restricitons, Increased muscle spasms, Pain, Improper body mechanics, Postural dysfunction  Visit Diagnosis: Other muscle spasm  Cervicalgia     Problem List Patient Active Problem List   Diagnosis Date Noted  . Cervical radiculopathy at C5 06/14/2017  . Well woman exam without gynecological exam 06/20/2014  . Skin cancer, basal cell 06/27/2013  . Hyperlipidemia   . Celiac disease     Sumner Boast., PT 07/20/2017, 4:32 PM  [place], [place], [address] Phone: [phone]   Fax:  [phone]  Name: TYMESHIA AWAN MRN: [id number] Date of Birth: 1980/10/17

## 2017-07-26 ENCOUNTER — Ambulatory Visit: Payer: 59 | Attending: Family Medicine | Admitting: Physical Therapy

## 2017-07-26 ENCOUNTER — Encounter: Payer: Self-pay | Admitting: Physical Therapy

## 2017-07-26 DIAGNOSIS — M62838 Other muscle spasm: Secondary | ICD-10-CM | POA: Insufficient documentation

## 2017-07-26 DIAGNOSIS — M542 Cervicalgia: Secondary | ICD-10-CM | POA: Insufficient documentation

## 2017-07-26 NOTE — Therapy (Signed)
[place], [place], [address] Phone: [phone]   Fax:  [phone]  Physical Therapy Treatment  Patient Details  Name: [name] MRN: [id number] Date of Birth: 08/31/1980 Referring Provider: Deborra Medina   Encounter Date: 07/26/2017  PT End of Session - 07/26/17 1650    Visit Number  5    Date for PT Re-Evaluation  08/29/17    PT Start Time  1610    PT Stop Time  1651    PT Time Calculation (min)  41 min    Activity Tolerance  Patient tolerated treatment well    Behavior During Therapy  [facility] for tasks assessed/performed       Past Medical History:  Diagnosis Date  . Basal cell carcinoma of nasolabial crease 2014  . Celiac disease   . Hyperlipidemia    DIET CONTROLLED  . PONV (postoperative nausea and vomiting)   . SVD (spontaneous vaginal delivery)    X 2  . Uterine prolapse 08/2009    Past Surgical History:  Procedure Laterality Date  . BILATERAL SALPINGECTOMY Bilateral 01/24/2014   Procedure: BILATERAL SALPINGECTOMY;  Surgeon: Emily Filbert, MD;  Location: [facility];  Service: Gynecology;  Laterality: Bilateral;  . CYSTOCELE REPAIR N/A 01/24/2014   Procedure: ANTERIOR REPAIR (CYSTOCELE);  Surgeon: Emily Filbert, MD;  Location: [facility];  Service: Gynecology;  Laterality: N/A;  . SKIN CANCER EXCISION     NOSE X 2  . TONSILLECTOMY AND ADENOIDECTOMY  1995  . VAGINAL HYSTERECTOMY N/A 01/24/2014   Procedure: HYSTERECTOMY VAGINAL;  Surgeon: Emily Filbert, MD;  Location: [facility];  Service: Gynecology;  Laterality: N/A;  . WISDOM TOOTH EXTRACTION      There were no vitals filed for this visit.  Subjective Assessment - 07/26/17 1642    Subjective  Patient reports that the traction made the tension come back.  She reports increased spasms    Currently in Pain?  Yes    Pain Score  3     Pain Location  Neck    Pain Orientation  Right    Aggravating Factors   looking down and to the left, reaching                        [facility] Adult PT Treatment/Exercise - 07/26/17 0001      Electrical Stimulation   Electrical Stimulation Location  left upper trap area    Electrical Stimulation Action  IFC    Electrical Stimulation Parameters  supine    Electrical Stimulation Goals  Pain      Manual Therapy   Soft tissue mobilization  Posterior para spinales and upper traps     Passive ROM  cerv with stretch       Trigger Point Dry Needling - 07/26/17 1643    Consent Given?  Yes    Education Handout Provided  Yes    Muscles Treated Upper Body  Upper trapezius;Levator scapulae    Upper Trapezius Response  Twitch reponse elicited    Levator Scapulae Response  Twitch response elicited             PT Short Term Goals - 07/02/17 1053      PT SHORT TERM GOAL #1   Title  independent with intial HEP    Status  Achieved        PT Long Term Goals - 07/20/17 8099      PT  LONG TERM GOAL #1   Title  understand posture and body mechanics    Status  Partially Met      PT LONG TERM GOAL #2   Title  decrease pain 50%    Status  On-going            Plan - 07/26/17 1650    Clinical Impression Statement  Patient reports that she was worse after traction, it could be that she is tense and has a great deal of difficulty relaxing and we could have caused pulling on the mms.  Had some good twitch responses in the upper trap    PT Next Visit Plan  see if dry needle helped her symptoms    Consulted and Agree with Plan of Care  Patient       Patient will benefit from skilled therapeutic intervention in order to improve the following deficits and impairments:  Decreased range of motion, Increased fascial restricitons, Increased muscle spasms, Pain, Improper body mechanics, Postural dysfunction  Visit Diagnosis: Other muscle spasm  Cervicalgia     Problem List Patient Active Problem List   Diagnosis Date Noted  . Cervical radiculopathy at C5 06/14/2017  . Well woman  exam without gynecological exam 06/20/2014  . Skin cancer, basal cell 06/27/2013  . Hyperlipidemia   . Celiac disease     Sumner Boast., PT 07/26/2017, 4:52 PM  [place], [place], [address] Phone: [phone]   Fax:  [phone]  Name: [name] MRN: [id number] Date of Birth: [birth year]

## 2017-07-28 ENCOUNTER — Encounter: Payer: Self-pay | Admitting: Physical Therapy

## 2017-07-28 ENCOUNTER — Ambulatory Visit: Payer: 59 | Admitting: Physical Therapy

## 2017-07-28 DIAGNOSIS — M542 Cervicalgia: Secondary | ICD-10-CM | POA: Diagnosis not present

## 2017-07-28 DIAGNOSIS — M62838 Other muscle spasm: Secondary | ICD-10-CM

## 2017-07-28 NOTE — Therapy (Signed)
[place], [place], [address] Phone: [phone]   Fax:  [phone]  Physical Therapy Treatment  Patient Details  Name: [name] MRN: [id number] Date of Birth: 1980-10-21 Referring Provider: Deborra Medina   Encounter Date: 07/28/2017  PT End of Session - 07/28/17 1646    Visit Number  6    Date for PT Re-Evaluation  08/29/17    PT Start Time  1610    PT Stop Time  1700    PT Time Calculation (min)  50 min    Activity Tolerance  Patient tolerated treatment well    Behavior During Therapy  [facility] for tasks assessed/performed       Past Medical History:  Diagnosis Date  . Basal cell carcinoma of nasolabial crease 2014  . Celiac disease   . Hyperlipidemia    DIET CONTROLLED  . PONV (postoperative nausea and vomiting)   . SVD (spontaneous vaginal delivery)    X 2  . Uterine prolapse 08/2009    Past Surgical History:  Procedure Laterality Date  . BILATERAL SALPINGECTOMY Bilateral 01/24/2014   Procedure: BILATERAL SALPINGECTOMY;  Surgeon: Emily Filbert, MD;  Location: [facility];  Service: Gynecology;  Laterality: Bilateral;  . CYSTOCELE REPAIR N/A 01/24/2014   Procedure: ANTERIOR REPAIR (CYSTOCELE);  Surgeon: Emily Filbert, MD;  Location: [facility];  Service: Gynecology;  Laterality: N/A;  . SKIN CANCER EXCISION     NOSE X 2  . TONSILLECTOMY AND ADENOIDECTOMY  1995  . VAGINAL HYSTERECTOMY N/A 01/24/2014   Procedure: HYSTERECTOMY VAGINAL;  Surgeon: Emily Filbert, MD;  Location: [facility];  Service: Gynecology;  Laterality: N/A;  . WISDOM TOOTH EXTRACTION      There were no vitals filed for this visit.  Subjective Assessment - 07/28/17 1645    Subjective  Patient report sthat hte dry needling helped    Currently in Pain?  Yes    Pain Score  2     Pain Location  Neck    Pain Orientation  Left                       [facility] Adult PT Treatment/Exercise - 07/28/17 0001      Electrical Stimulation    Electrical Stimulation Location  left upper trap area    Electrical Stimulation Action  IFC    Electrical Stimulation Parameters  supinepIN    Electrical Stimulation Goals  Pain      Manual Therapy   Manual therapy comments  gentle occipital release    Soft tissue mobilization  Posterior para spinales and upper traps     Passive ROM  cerv with stretch               PT Short Term Goals - 07/02/17 1053      PT SHORT TERM GOAL #1   Title  independent with intial HEP    Status  Achieved        PT Long Term Goals - 07/28/17 1647      PT LONG TERM GOAL #1   Title  understand posture and body mechanics    Status  Achieved            Plan - 07/28/17 1646    Clinical Impression Statement  Pateint had good relief from the dry needling from last treatment, she continues to have some knots and tension in the upper trap and levator  PT Next Visit Plan  she will be on vacation for two weeks.  Resume treatment when she returns    Consulted and Agree with Plan of Care  Patient       Patient will benefit from skilled therapeutic intervention in order to improve the following deficits and impairments:  Decreased range of motion, Increased fascial restricitons, Increased muscle spasms, Pain, Improper body mechanics, Postural dysfunction  Visit Diagnosis: Other muscle spasm  Cervicalgia     Problem List Patient Active Problem List   Diagnosis Date Noted  . Cervical radiculopathy at C5 06/14/2017  . Well woman exam without gynecological exam 06/20/2014  . Skin cancer, basal cell 06/27/2013  . Hyperlipidemia   . Celiac disease     Sumner Boast., PT 07/28/2017, 4:48 PM  [place], [place], [address] Phone: [phone]   Fax:  [phone]  Name: [name] MRN: [id number] Date of Birth: May 18, 1980

## 2017-08-10 ENCOUNTER — Ambulatory Visit: Payer: 59 | Admitting: Physical Therapy

## 2017-08-10 ENCOUNTER — Encounter: Payer: Self-pay | Admitting: Physical Therapy

## 2017-08-10 DIAGNOSIS — M62838 Other muscle spasm: Secondary | ICD-10-CM | POA: Diagnosis not present

## 2017-08-10 DIAGNOSIS — M542 Cervicalgia: Secondary | ICD-10-CM | POA: Diagnosis not present

## 2017-08-10 NOTE — Therapy (Signed)
[place], [place], [address] Phone: [phone]   Fax:  [phone]  Physical Therapy Treatment  Patient Details  Name: [name] MRN: [id number] Date of Birth: Aug 11, 1980 Referring Provider: Deborra Medina   Encounter Date: 08/10/2017  PT End of Session - 08/10/17 1648    Visit Number  7    Date for PT Re-Evaluation  08/29/17    PT Start Time  1602    PT Stop Time  1649    PT Time Calculation (min)  47 min    Activity Tolerance  Patient tolerated treatment well    Behavior During Therapy  [facility] for tasks assessed/performed       Past Medical History:  Diagnosis Date  . Basal cell carcinoma of nasolabial crease 2014  . Celiac disease   . Hyperlipidemia    DIET CONTROLLED  . PONV (postoperative nausea and vomiting)   . SVD (spontaneous vaginal delivery)    X 2  . Uterine prolapse 08/2009    Past Surgical History:  Procedure Laterality Date  . BILATERAL SALPINGECTOMY Bilateral 01/24/2014   Procedure: BILATERAL SALPINGECTOMY;  Surgeon: Emily Filbert, MD;  Location: [facility];  Service: Gynecology;  Laterality: Bilateral;  . CYSTOCELE REPAIR N/A 01/24/2014   Procedure: ANTERIOR REPAIR (CYSTOCELE);  Surgeon: Emily Filbert, MD;  Location: [facility];  Service: Gynecology;  Laterality: N/A;  . SKIN CANCER EXCISION     NOSE X 2  . TONSILLECTOMY AND ADENOIDECTOMY  1995  . VAGINAL HYSTERECTOMY N/A 01/24/2014   Procedure: HYSTERECTOMY VAGINAL;  Surgeon: Emily Filbert, MD;  Location: [facility];  Service: Gynecology;  Laterality: N/A;  . WISDOM TOOTH EXTRACTION      There were no vitals filed for this visit.  Subjective Assessment - 08/10/17 1646    Subjective  Patient was on vacation last week, reports that the burning is still present.      Currently in Pain?  Yes    Pain Score  2     Pain Location  Neck    Pain Orientation  Left    Aggravating Factors   describes burning in the left neck and upper trap with  looking down and to the right    Pain Relieving Factors  reports temporary relief with our treatment                       [facility] Adult PT Treatment/Exercise - 08/10/17 0001      Electrical Stimulation   Electrical Stimulation Location  left upper trap area    Electrical Stimulation Action  IFC    Electrical Stimulation Parameters  supine    Electrical Stimulation Goals  Pain      Manual Therapy   Manual therapy comments  gentle occipital release    Soft tissue mobilization  Posterior para spinales and upper traps     Passive ROM  stretches of levator and upper trap       Trigger Point Dry Needling - 08/10/17 1648    Consent Given?  Yes    Upper Trapezius Response  Twitch reponse elicited    Levator Scapulae Response  Twitch response elicited             PT Short Term Goals - 07/02/17 1053      PT SHORT TERM GOAL #1   Title  independent with intial HEP    Status  Achieved  PT Long Term Goals - 08/10/17 1650      PT LONG TERM GOAL #3   Title  lift 38 year old without increase of pain    Status  On-going            Plan - 08/10/17 1649    Clinical Impression Statement  Patient has a lot of twitch responses in the upper trap and the levator.  She is discouraged that the relief she gets is temporary.  We tried traction and that seemed to make the pain worse.  She reports burning is her biggest issue, not really pain.    PT Next Visit Plan  may see a few more times to see if we can get better carry over       Patient will benefit from skilled therapeutic intervention in order to improve the following deficits and impairments:  Decreased range of motion, Increased fascial restricitons, Increased muscle spasms, Pain, Improper body mechanics, Postural dysfunction  Visit Diagnosis: Other muscle spasm  Cervicalgia     Problem List Patient Active Problem List   Diagnosis Date Noted  . Cervical radiculopathy at C5 06/14/2017  . Well  woman exam without gynecological exam 06/20/2014  . Skin cancer, basal cell 06/27/2013  . Hyperlipidemia   . Celiac disease     Sumner Boast., PT 08/10/2017, 4:52 PM  [place], [place], [address] Phone: [phone]   Fax:  [phone]  Name: [name] MRN: [id number] Date of Birth: February 17, 1981

## 2017-08-12 ENCOUNTER — Encounter: Payer: Self-pay | Admitting: Physical Therapy

## 2017-08-12 ENCOUNTER — Ambulatory Visit: Payer: 59 | Admitting: Physical Therapy

## 2017-08-12 DIAGNOSIS — M62838 Other muscle spasm: Secondary | ICD-10-CM | POA: Diagnosis not present

## 2017-08-12 DIAGNOSIS — M542 Cervicalgia: Secondary | ICD-10-CM | POA: Diagnosis not present

## 2017-08-12 NOTE — Therapy (Signed)
[place], [place], [address] Phone: [phone]   Fax:  [phone]  Physical Therapy Treatment  Patient Details  Name: [name] MRN: [id number] Date of Birth: 1981/01/15 Referring Provider: Deborra Medina   Encounter Date: 08/12/2017  PT End of Session - 08/12/17 1647    Visit Number  8    Date for PT Re-Evaluation  08/29/17    PT Start Time  1602    PT Stop Time  1630    PT Time Calculation (min)  28 min    Activity Tolerance  Patient tolerated treatment well    Behavior During Therapy  [facility] for tasks assessed/performed       Past Medical History:  Diagnosis Date  . Basal cell carcinoma of nasolabial crease 2014  . Celiac disease   . Hyperlipidemia    DIET CONTROLLED  . PONV (postoperative nausea and vomiting)   . SVD (spontaneous vaginal delivery)    X 2  . Uterine prolapse 08/2009    Past Surgical History:  Procedure Laterality Date  . BILATERAL SALPINGECTOMY Bilateral 01/24/2014   Procedure: BILATERAL SALPINGECTOMY;  Surgeon: Emily Filbert, MD;  Location: [facility];  Service: Gynecology;  Laterality: Bilateral;  . CYSTOCELE REPAIR N/A 01/24/2014   Procedure: ANTERIOR REPAIR (CYSTOCELE);  Surgeon: Emily Filbert, MD;  Location: [facility];  Service: Gynecology;  Laterality: N/A;  . SKIN CANCER EXCISION     NOSE X 2  . TONSILLECTOMY AND ADENOIDECTOMY  1995  . VAGINAL HYSTERECTOMY N/A 01/24/2014   Procedure: HYSTERECTOMY VAGINAL;  Surgeon: Emily Filbert, MD;  Location: [facility];  Service: Gynecology;  Laterality: N/A;  . WISDOM TOOTH EXTRACTION      There were no vitals filed for this visit.  Subjective Assessment - 08/12/17 1643    Subjective  Patient really feels like she is not getting much carry over with the current treatment,    Currently in Pain?  Yes    Pain Location  Neck    Pain Orientation  Left                       [facility] Adult PT Treatment/Exercise - 08/12/17 0001       Self-Care   Self-Care  Other Self-Care Comments    Other Self-Care Comments   PT really reviewed posture and body mechanics for ADL's and her job, went over and gave an advanced HEP, PT demo and had patient demo, went over the care she could do at home, TENS, husband doing massage, and showed her traction devices she could get online, I  strongly recommended traction and suggested that when we tried here she did not relax and maybe she could do better at home as she is type A               PT Short Term Goals - 07/02/17 1053      PT SHORT TERM GOAL #1   Title  independent with intial HEP    Status  Achieved        PT Long Term Goals - 08/12/17 1649      PT LONG TERM GOAL #1   Title  understand posture and body mechanics    Status  Achieved      PT LONG TERM GOAL #2   Title  decrease pain 50%      PT LONG TERM GOAL #3   Title  lift 37  year old without increase of pain    Status  Partially Met            Plan - 08/12/17 1648    Clinical Impression Statement  Patient reports that she understand all that we talked about today, I also educated on the fusion that she is talking about and how that coulde be a domino to [address] down [address] road problems above and below that fusion and worried about her age    PT Next Visit Plan  she is going to try HEP, TENS and traction on her own at home as well as watch the posture and body mechanics.  Hold treatment    Consulted and Agree with Plan of Care  Patient       Patient will benefit from skilled therapeutic intervention in order to improve the following deficits and impairments:  Decreased range of motion, Increased fascial restricitons, Increased muscle spasms, Pain, Improper body mechanics, Postural dysfunction  Visit Diagnosis: Other muscle spasm  Cervicalgia     Problem List Patient Active Problem List   Diagnosis Date Noted  . Cervical radiculopathy at C5 06/14/2017  . Well woman exam without gynecological  exam 06/20/2014  . Skin cancer, basal cell 06/27/2013  . Hyperlipidemia   . Celiac disease     Sumner Boast., PT 08/12/2017, 4:50 PM  [place], [place], [address] Phone: [phone]   Fax:  [phone]  Name: [name] MRN: [id number] Date of Birth: 12/23/1980

## 2017-08-16 ENCOUNTER — Ambulatory Visit: Payer: 59 | Admitting: Allergy and Immunology

## 2017-08-25 ENCOUNTER — Encounter: Payer: Self-pay | Admitting: Obstetrics & Gynecology

## 2017-08-25 ENCOUNTER — Ambulatory Visit (INDEPENDENT_AMBULATORY_CARE_PROVIDER_SITE_OTHER): Payer: 59 | Admitting: Obstetrics & Gynecology

## 2017-08-25 VITALS — BP 106/73 | HR 58 | Ht 61.0 in | Wt 122.0 lb

## 2017-08-25 DIAGNOSIS — Z01419 Encounter for gynecological examination (general) (routine) without abnormal findings: Secondary | ICD-10-CM

## 2017-08-25 NOTE — Progress Notes (Signed)
Subjective:    [name] is a 37 y.o. married P2 (73 and 62 yo kids) female who presents for an annual exam. The patient has no complaints today. The patient is sexually active. GYN screening history: last pap: was normal. The patient wears seatbelts: yes. The patient participates in regular exercise: yes. Has the patient ever been transfused or tattooed?: no. The patient reports that there is not domestic violence in her life.   Menstrual History: OB History    Gravida  2   Para  2   Term  2   Preterm  0   AB  0   Living  2     SAB  0   TAB  0   Ectopic  0   Multiple  0   Live Births  2           Menarche age: 39 Patient's last menstrual period was 03/08/2013.    The following portions of the patient's history were reviewed and updated as appropriate: allergies, current medications, past family history, past medical history, past social history, past surgical history and problem list.  Review of Systems Pertinent items are noted in HPI.   FH + breast cancer - paternal half aunt, no colon or gyn cancer Works at [employer] Denies dyspareunia   Objective:    BP 106/73   Pulse (!) 58   Ht 5\' 1"  (1.549 m)   Wt 122 lb (55.3 kg)   LMP 03/08/2013   BMI 23.05 kg/m   General Appearance:    Alert, cooperative, no distress, appears stated age  Head:    Normocephalic, without obvious abnormality, atraumatic  Eyes:    PERRL, conjunctiva/corneas clear, EOM's intact, fundi    benign, both eyes  Ears:    Normal TM's and external ear canals, both ears  Nose:   Nares normal, septum midline, mucosa normal, no drainage    or sinus tenderness  Throat:   Lips, mucosa, and tongue normal; teeth and gums normal  Neck:   Supple, symmetrical, trachea midline, no adenopathy;    thyroid:  no enlargement/tenderness/nodules; no carotid   bruit or JVD  Back:     Symmetric, no curvature, ROM normal, no CVA tenderness  Lungs:     Clear to auscultation bilaterally, respirations unlabored   Chest Wall:    No tenderness or deformity   Heart:    Regular rate and rhythm, S1 and S2 normal, no murmur, rub   or gallop  Breast Exam:    No tenderness, masses, or nipple abnormality  Abdomen:     Soft, non-tender, bowel sounds active all four quadrants,    no masses, no organomegaly  Genitalia:    Normal female without lesion, discharge or tenderness, good support of bladder, no masses or tenderness with bimanual exam     Extremities:   Extremities normal, atraumatic, no cyanosis or edema  Pulses:   2+ and symmetric all extremities  Skin:   Skin color, texture, turgor normal, no rashes or lesions  Lymph nodes:   Cervical, supraclavicular, and axillary nodes normal  Neurologic:   CNII-XII intact, normal strength, sensation and reflexes    throughout  .    Assessment:    Healthy female exam.    Plan:     Rec continued healthy lifestyle

## 2017-08-25 NOTE — Progress Notes (Signed)
Pt presents for annual with no complaints today.

## 2017-10-20 DIAGNOSIS — M542 Cervicalgia: Secondary | ICD-10-CM | POA: Diagnosis not present

## 2017-11-05 DIAGNOSIS — Z85828 Personal history of other malignant neoplasm of skin: Secondary | ICD-10-CM | POA: Diagnosis not present

## 2017-11-05 DIAGNOSIS — L570 Actinic keratosis: Secondary | ICD-10-CM | POA: Diagnosis not present

## 2017-11-05 DIAGNOSIS — L814 Other melanin hyperpigmentation: Secondary | ICD-10-CM | POA: Diagnosis not present

## 2017-11-05 DIAGNOSIS — C44319 Basal cell carcinoma of skin of other parts of face: Secondary | ICD-10-CM | POA: Diagnosis not present

## 2017-11-05 DIAGNOSIS — D485 Neoplasm of uncertain behavior of skin: Secondary | ICD-10-CM | POA: Diagnosis not present

## 2018-01-05 DIAGNOSIS — C44319 Basal cell carcinoma of skin of other parts of face: Secondary | ICD-10-CM | POA: Diagnosis not present

## 2018-01-11 ENCOUNTER — Encounter: Payer: Self-pay | Admitting: Family Medicine

## 2018-02-04 DIAGNOSIS — D225 Melanocytic nevi of trunk: Secondary | ICD-10-CM | POA: Diagnosis not present

## 2018-02-04 DIAGNOSIS — L821 Other seborrheic keratosis: Secondary | ICD-10-CM | POA: Diagnosis not present

## 2018-02-04 DIAGNOSIS — D1801 Hemangioma of skin and subcutaneous tissue: Secondary | ICD-10-CM | POA: Diagnosis not present

## 2018-02-04 DIAGNOSIS — L812 Freckles: Secondary | ICD-10-CM | POA: Diagnosis not present

## 2018-02-04 DIAGNOSIS — Z85828 Personal history of other malignant neoplasm of skin: Secondary | ICD-10-CM | POA: Diagnosis not present

## 2018-02-24 ENCOUNTER — Encounter: Payer: Self-pay | Admitting: Family Medicine

## 2018-04-05 ENCOUNTER — Ambulatory Visit (INDEPENDENT_AMBULATORY_CARE_PROVIDER_SITE_OTHER): Payer: 59 | Admitting: Family Medicine

## 2018-04-05 ENCOUNTER — Encounter: Payer: Self-pay | Admitting: Family Medicine

## 2018-04-05 VITALS — BP 106/68 | HR 60 | Temp 98.3°F | Ht 61.0 in | Wt 120.4 lb

## 2018-04-05 DIAGNOSIS — N6320 Unspecified lump in the left breast, unspecified quadrant: Secondary | ICD-10-CM

## 2018-04-05 NOTE — Assessment & Plan Note (Signed)
New- with prior nipple pain.  Firm fibrous mass palpated on exam, tender. Will send for diagnostic mammogram and [place] for further evaluation. The patient indicates understanding of these issues and agrees with the plan. Orders Placed This Encounter  Procedures  . MM Digital Diagnostic Unilat L  . US BREAST COMPLETE UNI LEFT INC AXILLA

## 2018-04-05 NOTE — Patient Instructions (Signed)
Please call the breast center at [phone] to schedule your mammogram.

## 2018-04-05 NOTE — Progress Notes (Signed)
Subjective:   Patient ID: [name], female    DOB: [birth year], 38 y.o.   MRN: [id number]  [name] is a pleasant 38 y.o. year old female who presents to clinic today with Breast Problem (Patient is here today C/O a lump in her left breast. The only known H/O cancer in her family is her PGM with Pancreatic Cancer.  Her nipple has been painful for a couple months then spread below.  There has been a lump present for 1 month now and is in the 6:00 position.  Denies any drainage from nipple or erythema but states that there is a dull ache. Pt agrees to go to [facility] if order is placed.)  on 04/05/2018  HPI:  Left breast mass- noticed it about a month ago- 6 oclock position, can be tender to palpation.  She did notice her left nipple was sore a month or two prior to noticing the lump.  No drainage or erythema from that nipple. She does have family h/o pancreatic cancer but no first degree relative with breast CA.  She did have genetic testing through OBGYN due to her pancreatic CA.    Current Outpatient Medications on File Prior to Visit  Medication Sig Dispense Refill  . esomeprazole (NEXIUM) 40 MG capsule Take 40 mg by mouth daily at 12 noon.    . Multiple Vitamin (MULTIVITAMIN) tablet Take 1 tablet by mouth daily.     No current facility-administered medications on file prior to visit.     No Known Allergies  Past Medical History:  Diagnosis Date  . Basal cell carcinoma of nasolabial crease 2014  . Celiac disease   . Hyperlipidemia    DIET CONTROLLED  . PONV (postoperative nausea and vomiting)   . SVD (spontaneous vaginal delivery)    X 2  . Uterine prolapse 08/2009    Past Surgical History:  Procedure Laterality Date  . BILATERAL SALPINGECTOMY Bilateral 01/24/2014   Procedure: BILATERAL SALPINGECTOMY;  Surgeon: Emily Filbert, MD;  Location: [facility];  Service: Gynecology;  Laterality: Bilateral;  . CYSTOCELE REPAIR N/A 01/24/2014   Procedure:  ANTERIOR REPAIR (CYSTOCELE);  Surgeon: Emily Filbert, MD;  Location: [facility];  Service: Gynecology;  Laterality: N/A;  . SKIN CANCER EXCISION     NOSE X 3  . TONSILLECTOMY AND ADENOIDECTOMY  1995  . VAGINAL HYSTERECTOMY N/A 01/24/2014   Procedure: HYSTERECTOMY VAGINAL;  Surgeon: Emily Filbert, MD;  Location: [facility];  Service: Gynecology;  Laterality: N/A;  . WISDOM TOOTH EXTRACTION      Family History  Problem Relation Age of Onset  . Hypothyroidism Mother   . Hyperlipidemia Father   . Celiac disease Sister   . Hashimoto's thyroiditis Sister   . Heart disease Paternal Grandfather 50       multiple CABG  . Hyperlipidemia Paternal Grandfather   . Cancer Paternal Grandmother        pancreatic cancer    Social History   Socioeconomic History  . Marital status: Married    Spouse name: Not on file  . Number of children: Not on file  . Years of education: Not on file  . Highest education level: Not on file  Occupational History  . Not on file  Social Needs  . Financial resource strain: Not on file  . Food insecurity:    Worry: Not on file    Inability: Not on file  . Transportation needs:    Medical:  Not on file    Non-medical: Not on file  Tobacco Use  . Smoking status: Never Smoker  . Smokeless tobacco: Never Used  Substance and Sexual Activity  . Alcohol use: Yes    Comment: occassionally  . Drug use: No  . Sexual activity: Yes    Partners: Male    Birth control/protection: Surgical  Lifestyle  . Physical activity:    Days per week: Not on file    Minutes per session: Not on file  . Stress: Not on file  Relationships  . Social connections:    Talks on phone: Not on file    Gets together: Not on file    Attends religious service: Not on file    Active member of club or organization: Not on file    Attends meetings of clubs or organizations: Not on file    Relationship status: Not on file  . Intimate partner violence:    Fear of current or ex partner: Not on file      Emotionally abused: Not on file    Physically abused: Not on file    Forced sexual activity: Not on file  Other Topics Concern  . 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   Recently moved here from [place].   Married, 1 daughter named [name] (born 2011).   The PMH, PSH, Social History, Family History, Medications, and allergies have been reviewed in [facility] and have been updated if relevant.   Review of Systems  Constitutional: Negative.   Genitourinary: Negative.   Musculoskeletal: Negative.   Allergic/Immunologic: Negative.   Neurological: Negative.   Hematological: Negative.   Psychiatric/Behavioral: Negative.   All other systems reviewed and are negative.      Objective:    BP 106/68 (BP Location: Left Arm, Patient Position: Sitting, Cuff Size: Normal)   Pulse 60   Temp 98.3 F (36.8 C) (Oral)   Ht 5\' 1"  (1.549 m)   Wt 120 lb 6.4 oz (54.6 kg)   LMP 03/08/2013   SpO2 99%   BMI 22.75 kg/m    Physical Exam Vitals signs and nursing note reviewed.  Constitutional:      Appearance: Normal appearance. She is normal weight.  HENT:     Head: Normocephalic.  Neck:     Musculoskeletal: Normal range of motion.  Cardiovascular:     Rate and Rhythm: Normal rate.     Pulses: Normal pulses.  Pulmonary:     Effort: Pulmonary effort is normal.  Chest:     Breasts:        Right: Normal. No inverted nipple, mass, nipple discharge, skin change or tenderness.        Left: Mass and tenderness present. No swelling, bleeding, inverted nipple, nipple discharge or skin change.  Musculoskeletal: Normal range of motion.  Lymphadenopathy:     Upper Body:     Right upper body: No supraclavicular, axillary or pectoral adenopathy.     Left upper body: No supraclavicular, axillary or pectoral adenopathy.  Skin:    General: Skin is warm and dry.  Neurological:     General: No focal deficit present.     Mental Status: She is alert and oriented to  person, place, and time.  Psychiatric:        Mood and Affect: Mood normal.        Behavior: Behavior normal.        Thought Content: Thought content normal.  Judgment: Judgment normal.           Assessment & Plan:   Left breast mass - Plan: MM Digital Diagnostic Unilat L, US BREAST COMPLETE UNI LEFT INC AXILLA No follow-ups on file.

## 2018-04-12 ENCOUNTER — Ambulatory Visit
Admission: RE | Admit: 2018-04-12 | Discharge: 2018-04-12 | Disposition: A | Discharge status: Home / Self Care | Payer: 59 | Source: Ambulatory Visit | Attending: Family Medicine | Admitting: Family Medicine

## 2018-04-12 DIAGNOSIS — N6320 Unspecified lump in the left breast, unspecified quadrant: Secondary | ICD-10-CM | POA: Diagnosis not present

## 2018-04-12 DIAGNOSIS — R922 Inconclusive mammogram: Secondary | ICD-10-CM | POA: Diagnosis not present

## 2018-05-23 ENCOUNTER — Encounter: Payer: Self-pay | Admitting: Family Medicine

## 2018-05-23 ENCOUNTER — Other Ambulatory Visit: Payer: Self-pay

## 2018-05-23 MED ORDER — CETIRIZINE HCL 10 MG PO TABS: 10.0000 mg | ORAL_TABLET | Freq: Every day | ORAL | 3 refills | Status: AC

## 2018-05-23 MED ORDER — FLUTICASONE PROPIONATE 50 MCG/ACT NA SUSP: 2.0000 | Freq: Every day | NASAL | 3 refills | Status: DC

## 2018-05-25 MED FILL — FLUTICASONE PROP 50 MCG SPR: 50 | 90 days supply | Qty: 48 | Fill #0

## 2018-05-25 MED FILL — CETIRIZINE HCL 10 MG TABS: 10 | 90 days supply | Qty: 90 | Fill #0

## 2018-08-01 ENCOUNTER — Encounter: Payer: 59 | Admitting: Family Medicine

## 2018-08-05 ENCOUNTER — Other Ambulatory Visit: Payer: Self-pay | Admitting: *Deleted

## 2018-08-05 DIAGNOSIS — D485 Neoplasm of uncertain behavior of skin: Secondary | ICD-10-CM | POA: Diagnosis not present

## 2018-08-05 DIAGNOSIS — D229 Melanocytic nevi, unspecified: Secondary | ICD-10-CM | POA: Diagnosis not present

## 2018-08-05 DIAGNOSIS — C44619 Basal cell carcinoma of skin of left upper limb, including shoulder: Secondary | ICD-10-CM | POA: Diagnosis not present

## 2018-08-05 DIAGNOSIS — N8189 Other female genital prolapse: Secondary | ICD-10-CM

## 2018-08-05 DIAGNOSIS — L812 Freckles: Secondary | ICD-10-CM | POA: Diagnosis not present

## 2018-08-15 ENCOUNTER — Ambulatory Visit: Payer: 59 | Attending: Obstetrics & Gynecology | Admitting: Physical Therapy

## 2018-08-15 ENCOUNTER — Encounter: Payer: Self-pay | Admitting: Physical Therapy

## 2018-08-15 ENCOUNTER — Other Ambulatory Visit: Payer: Self-pay

## 2018-08-15 DIAGNOSIS — M62838 Other muscle spasm: Secondary | ICD-10-CM | POA: Insufficient documentation

## 2018-08-15 DIAGNOSIS — M542 Cervicalgia: Secondary | ICD-10-CM | POA: Diagnosis not present

## 2018-08-15 NOTE — Patient Instructions (Signed)
STRETCHING THE PELVIC FLOOR MUSCLES NO DILATOR  Supplies . Vaginal lubricant . Mirror (optional) . Gloves (optional) Positioning . Start in a semi-reclined position with your head propped up. Bend your knees and place your thumb or finger at the vaginal opening. Procedure . Apply a moderate amount of lubricant on the outer skin of your vagina, the labia minora.  Apply additional lubricant to your finger. . Spread the skin away from the vaginal opening. Place the end of your finger at the opening. . Do a maximum contraction of the pelvic floor muscles. Tighten the vagina and the anus maximally and relax. . When you know they are relaxed, gently and slowly insert your finger into your vagina, directing your finger slightly downward, for 2-3 inches of insertion. . Relax and stretch the 6 o'clock position . Hold each stretch for _2 min__ and repeat __1_ time with rest breaks of _1__ seconds between each stretch. . Repeat the stretching in the 4 o'clock and 8 o'clock positions. . Total time should be _6__ minutes, _1__ x per day.  Note the amount of theme your were able to achieve and your tolerance to your finger in your vagina. . Once you have accomplished the techniques you may try them in standing with one foot resting on the tub, or in other positions.  This is a good stretch to do in the shower if you don't need to use lubricant.   [facility] [address], [address] [place], [place] [address] Phone # [phone] Fax [phone]  

## 2018-08-16 NOTE — Therapy (Signed)
[facility] [address] [place], [place], [address] Phone: [phone]   Fax:  [phone]  Physical Therapy Evaluation  Patient Details  Name: [name] MRN: [id number] Date of Birth: 1980/03/31 Referring Provider (PT): Emily Filbert, MD   Encounter Date: 08/15/2018  PT End of Session - 08/15/18 1949    Visit Number  1    Date for PT Re-Evaluation  10/10/18    PT Start Time  1900    PT Stop Time  1942    PT Time Calculation (min)  42 min    Activity Tolerance  Patient tolerated treatment well    Behavior During Therapy  [facility] for tasks assessed/performed       Past Medical History:  Diagnosis Date  . Basal cell carcinoma of nasolabial crease 2014  . Celiac disease   . Hyperlipidemia    DIET CONTROLLED  . PONV (postoperative nausea and vomiting)   . SVD (spontaneous vaginal delivery)    X 2  . Uterine prolapse 08/2009    Past Surgical History:  Procedure Laterality Date  . BILATERAL SALPINGECTOMY Bilateral 01/24/2014   Procedure: BILATERAL SALPINGECTOMY;  Surgeon: Emily Filbert, MD;  Location: [facility];  Service: Gynecology;  Laterality: Bilateral;  . CYSTOCELE REPAIR N/A 01/24/2014   Procedure: ANTERIOR REPAIR (CYSTOCELE);  Surgeon: Emily Filbert, MD;  Location: [facility];  Service: Gynecology;  Laterality: N/A;  . SKIN CANCER EXCISION     NOSE X 3  . TONSILLECTOMY AND ADENOIDECTOMY  1995  . VAGINAL HYSTERECTOMY N/A 01/24/2014   Procedure: HYSTERECTOMY VAGINAL;  Surgeon: Emily Filbert, MD;  Location: [facility];  Service: Gynecology;  Laterality: N/A;  . WISDOM TOOTH EXTRACTION      There were no vitals filed for this visit.   Subjective Assessment - 08/15/18 1903    Subjective  Pt did well after cystocele until a couple of months ago.  Pt has strong urges to void, rectal pain with intercourse, feeling like bladder isn't emptying all the way, and leakage when laughing.  Not sure if there's leakage when working out     Currently in Pain?  Yes   only during intercourse   Pain Score  4     Pain Location  Rectum    Pain Orientation  Mid;Lower    Pain Descriptors / Indicators  Pressure    Pain Type  Acute pain    Pain Onset  More than a month ago    Pain Frequency  Intermittent    Aggravating Factors   intercourse and pushing to pee    Multiple Pain Sites  No         [facility] PT Assessment - 08/16/18 0001      Assessment   Medical Diagnosis  N81.89 (ICD-10-CM) - Pelvic floor weakness in female    Referring Provider (PT)  Emily Filbert, MD    Onset Date/Surgical Date  --   2 months   Prior Therapy  No      Precautions   Precautions  None      Balance Screen   Has the patient fallen in the past 6 months  No      [place] residence    Living Arrangements  Spouse/significant other;Children    Additional Comments  9 and 4      Prior Function   Vocation  Full time employment    Biomedical scientist  PA  in OR    Leisure  exercise      Cognition   Overall Cognitive Status  Within Functional Limits for tasks assessed      Posture/Postural Control   Posture/Postural Control  Postural limitations    Postural Limitations  Rounded Shoulders;Anterior pelvic tilt;Increased thoracic kyphosis      ROM / Strength   AROM / PROM / Strength  PROM      PROM   Overall PROM Comments  Hips: left IR 25% limited, Rt ER 25% limited      Strength   Overall Strength Comments  weakness of hip flexion bilat, Rt hip adduction, Lt  hip abduction      Flexibility   Soft Tissue Assessment /Muscle Length  yes    Hamstrings  tight Rt>Lt - [facility]      Palpation   Palpation comment  tight lumbar paraspinals                Objective measurements completed on examination: See above findings.    Pelvic Floor Special Questions - 08/16/18 0001    Prior Pelvic/Prostate Exam  Yes    Are you Pregnant or attempting pregnancy?  No    Prior Pregnancies  Yes    Number of  Pregnancies  2    Number of Vaginal Deliveries  2    Any difficulty with labor and deliveries  Yes    Episiotomy Performed  Yes    Diastasis Recti  yes - umbilicus 2.5 finger width    Currently Sexually Active  Yes    Is this Painful  Yes    Marinoff Scale  discomfort that does not affect completion    Urinary Leakage  Yes    How often  2/week    Pad use  no    Activities that cause leaking  Coughing;Sneezing;Laughing    Urinary urgency  Yes    Urinary frequency  try to go at least every 4 hours    Fecal incontinence  No    Falling out feeling (prolapse)  Yes    Activities that cause feeling of prolapse  sitting a long time    Skin Integrity  Intact    Prolapse  None    Pelvic Floor Internal Exam  pt identity confirmed and consent given to perform internal soft tissue assessment    Exam Type  Vaginal    Palpation  pubococcygeus and OI tight and tender (feel in rectum)    Strength  weak squeeze, no lift    Strength # of reps  3    Strength # of seconds  5    Tone  posterior pelvic floor high when trying to bulge       [facility] Adult PT Treatment/Exercise - 08/16/18 0001      Self-Care   Other Self-Care Comments   initial HEP and self massage      Manual Therapy   Manual therapy comments  soft tissue mobs to levators and OI on left pelvic floor after assessment of soft tissue as seen in examination section             PT Education - 08/15/18 1936    Education provided  Yes    Education Details  Access Code: [email or web address]    Person(s) Educated  Patient    Methods  Explanation;Demonstration;Verbal cues;Handout    Comprehension  Verbalized understanding;Returned demonstration       PT Short Term Goals - 08/16/18 1331  PT SHORT TERM GOAL #1   Title  independent with intial HEP    Time  2    Period  Weeks    Status  New    Target Date  08/29/18        PT Long Term Goals - 08/15/18 2005      PT LONG TERM GOAL #1   Title  Pt will be able to cough or sneeze  without urinary leakage    Baseline  leakage 2x/week    Time  8    Period  Weeks    Status  New    Target Date  10/10/18      PT LONG TERM GOAL #2   Title  Pt will report pain/pressure during intercourse reduced by 50%    Time  8    Period  Weeks    Status  New    Target Date  10/10/18      PT LONG TERM GOAL #3   Title  Pt will be able to contract and hold pelvic floor contraction for at least 15 seconds for improved endurance to reduce the urge to void while working    Baseline  can hold 5 seconds    Time  8    Period  Weeks    Status  New    Target Date  10/10/18             Plan - 08/15/18 1949    Clinical Impression Statement  Pt presents to clinic due to recent onset of urinary leakage.  She has had multiple surgeries as mentioned in history.  She demonstrates weakness of pelvic floor and hips as mentioned with some .  She has diastasis just at the umbilicus of 2.5 finger width.  Pt has decreased lumbar flexion and increased anterior pelvic tilt.  She has tight lumbar paraspinals.  She has decreased hip rotation ROM as mentioned above.  Pt will benefit from skille    Personal Factors and Comorbidities  Comorbidity 3+    Comorbidities  cystocele repair, hysterectomy, episiotomy with 3rd deg tear    Examination-Activity Limitations  Toileting    Stability/Clinical Decision Making  Evolving/Moderate complexity    Clinical Decision Making  Moderate    Rehab Potential  Excellent    PT Frequency  2x / week    PT Duration  8 weeks    PT Treatment/Interventions  ADLs/Self Care Home Management;Cryotherapy;Electrical Stimulation;Moist Heat;Traction;Ultrasound;Therapeutic exercise;Therapeutic activities;Patient/family education;Manual techniques;Dry needling;Taping;Biofeedback;Neuromuscular re-education;Passive range of motion    PT Next Visit Plan  toileting without bearing down, DN to lumbar paraspinals, biofeedback    PT Home Exercise Plan  Access Code: [email or web address]    Consulted  and Agree with Plan of Care  Patient       Patient will benefit from skilled therapeutic intervention in order to improve the following deficits and impairments:  Decreased range of motion, Increased fascial restricitons, Increased muscle spasms, Pain, Improper body mechanics, Postural dysfunction, Decreased strength, Decreased coordination  Visit Diagnosis: 1. Other muscle spasm   2. Cervicalgia        Problem List Patient Active Problem List   Diagnosis Date Noted  . Left breast mass 04/05/2018  . Cervical radiculopathy at C5 06/14/2017  . Well woman exam without gynecological exam 06/20/2014  . Skin cancer, basal cell 06/27/2013  . Hyperlipidemia   . Celiac disease     Jule Ser, PT 08/16/2018, 1:48 PM  [facility] [address],  [place], [place], [address] Phone: [phone]   Fax:  [phone]  Name: [name] MRN: [id number] Date of Birth: 02/06/1981

## 2018-08-19 ENCOUNTER — Ambulatory Visit: Payer: 59 | Admitting: Physical Therapy

## 2018-08-19 ENCOUNTER — Other Ambulatory Visit: Payer: Self-pay

## 2018-08-19 ENCOUNTER — Encounter: Payer: Self-pay | Admitting: Physical Therapy

## 2018-08-19 DIAGNOSIS — M542 Cervicalgia: Secondary | ICD-10-CM

## 2018-08-19 DIAGNOSIS — M62838 Other muscle spasm: Secondary | ICD-10-CM | POA: Diagnosis not present

## 2018-08-19 NOTE — Patient Instructions (Signed)
Toileting Techniques for Bowel Movements (Defecation) Using your belly (abdomen) and pelvic floor muscles to have a bowel movement is usually instinctive.  Sometimes people can have problems with these muscles and have to relearn proper defecation (emptying) techniques.  If you have weakness in your muscles, organs that are falling out, decreased sensation in your pelvis, or ignore your urge to go, you may find yourself straining to have a bowel movement.  You are straining if you are: . holding your breath or taking in a huge gulp of air and holding it  . keeping your lips and jaw tensed and closed tightly . turning red in the face because of excessive pushing or forcing . developing or worsening your  hemorrhoids . getting faint while pushing . not emptying completely and have to defecate many times a day  If you are straining, you are actually making it harder for yourself to have a bowel movement.  Many people find they are pulling up with the pelvic floor muscles and closing off instead of opening the anus. Due to lack pelvic floor relaxation and coordination the abdominal muscles, one has to work harder to push the feces out.  Many people have never been taught how to defecate efficiently and effectively.  Notice what happens to your body when you are having a bowel movement.  While you are sitting on the toilet pay attention to the following areas: . Jaw and mouth position . Angle of your hips   . Whether your feet touch the ground or not . Arm placement  . Spine position . Waist . Belly tension . Anus (opening of the anal canal)  An Evacuation/Defecation Plan   Here are the 4 basic points:  1. Lean forward enough for your elbows to rest on your knees 2. Support your feet on the floor or use a low stool if your feet don't touch the floor  3. Push out your belly as if you have swallowed a beach ball-you should feel a widening of your waist 4. Open and relax your pelvic floor muscles,  rather than tightening around the anus      The following conditions my require modifications to your toileting posture:  . If you have had surgery in the past that limits your back, hip, pelvic, knee or ankle flexibility . Constipation   Your healthcare practitioner may make the following additional suggestions and adjustments:  1) Sit on the toilet  a) Make sure your feet are supported. b) Notice your hip angle and spine position-most people find it effective to lean forward or raise their knees, which can help the muscles around the anus to relax  c) When you lean forward, place your forearms on your thighs for support  2) Relax suggestions a) Breath deeply in through your nose and out slowly through your mouth as if you are smelling the flowers and blowing out the candles. b) To become aware of how to relax your muscles, contracting and releasing muscles can be helpful.  Pull your pelvic floor muscles in tightly by using the image of holding back gas, or closing around the anus (visualize making a circle smaller) and lifting the anus up and in.  Then release the muscles and your anus should drop down and feel open. Repeat 5 times ending with the feeling of relaxation. c) Keep your pelvic floor muscles relaxed; let your belly bulge out. d) The digestive tract starts at the mouth and ends at the anal opening, so be   sure to relax both ends of the tube.  Place your tongue on the roof of your mouth with your teeth separated.  This helps relax your mouth and will help to relax the anus at the same time.  3) Empty (defecation) a) Keep your pelvic floor and sphincter relaxed, then bulge your anal muscles.  Make the anal opening wide.  b) Stick your belly out as if you have swallowed a beach ball. c) Make your belly wall hard using your belly muscles while continuing to breathe. Doing this makes it easier to open your anus. d) Breath out and give a grunt (or try using other sounds such as  ahhhh, shhhhh, ohhhh or grrrrrrr).  4) Finish a) As you finish your bowel movement, pull the pelvic floor muscles up and in.  This will leave your anus in the proper place rather than remaining pushed out and down. If you leave your anus pushed out and down, it will start to feel as though that is normal and give you incorrect signals about needing to have a bowel movement.   [facility] [address] [place], [place] [address]  

## 2018-08-19 NOTE — Therapy (Signed)
[facility] [address], [place], [place], [address] Phone: [phone]   Fax:  [phone]  Physical Therapy Treatment  Patient Details  Name: [name] MRN: [id number] Date of Birth: [birth year] Referring Provider (PT): Emily Filbert, MD   Encounter Date: 08/19/2018  PT End of Session - 08/19/18 1433    Visit Number  2    Date for PT Re-Evaluation  10/10/18    PT Start Time  1433    PT Stop Time  1515    PT Time Calculation (min)  42 min    Activity Tolerance  Patient tolerated treatment well    Behavior During Therapy  [facility] for tasks assessed/performed       Past Medical History:  Diagnosis Date  . Basal cell carcinoma of nasolabial crease 2014  . Celiac disease   . Hyperlipidemia    DIET CONTROLLED  . PONV (postoperative nausea and vomiting)   . SVD (spontaneous vaginal delivery)    X 2  . Uterine prolapse 08/2009    Past Surgical History:  Procedure Laterality Date  . BILATERAL SALPINGECTOMY Bilateral 01/24/2014   Procedure: BILATERAL SALPINGECTOMY;  Surgeon: Emily Filbert, MD;  Location: [facility];  Service: Gynecology;  Laterality: Bilateral;  . CYSTOCELE REPAIR N/A 01/24/2014   Procedure: ANTERIOR REPAIR (CYSTOCELE);  Surgeon: Emily Filbert, MD;  Location: [facility];  Service: Gynecology;  Laterality: N/A;  . SKIN CANCER EXCISION     NOSE X 3  . TONSILLECTOMY AND ADENOIDECTOMY  1995  . VAGINAL HYSTERECTOMY N/A 01/24/2014   Procedure: HYSTERECTOMY VAGINAL;  Surgeon: Emily Filbert, MD;  Location: [facility];  Service: Gynecology;  Laterality: N/A;  . WISDOM TOOTH EXTRACTION      There were no vitals filed for this visit.  Subjective Assessment - 08/19/18 1434    Subjective  Pt drinks a lot of water and has to go to the bathroom more in the morning.  When at work it is harder to empty the bladder completely.    Currently in Pain?  No/denies                    Pelvic Floor Special Questions -  08/19/18 0001    Pelvic Floor Internal Exam  pt identity confirmed and consent given to perform internal soft tissue assessment        [facility] Adult PT Treatment/Exercise - 08/19/18 0001      Self-Care   Other Self-Care Comments   toileting techniques      Exercises   Exercises  Lumbar      Lumbar Exercises: Stretches   Press Ups  5 reps;10 seconds    Press Ups Limitations  cat camel stretch 5xeach way    Prone Mid Back Stretch Limitations  childs pose with side band and threading - 2 x 30sec    Other Lumbar Stretch Exercise  happy baby stretch = 3 x 20 sec      Manual Therapy   Manual Therapy  Internal Pelvic Floor    Manual therapy comments  pt identity confirmed and consent given to perform internal soft tissue assessment    Soft tissue mobilization  lumbar paraspinals    Internal Pelvic Floor  levator and OI STM Lt>Rt       Trigger Point Dry Needling - 08/19/18 0001    Consent Given?  Yes    Education Handout Provided  Previously provided    Muscles Treated Back/Hip  Lumbar multifidi    Other Dry Needling  Thoracic multifidi T10-12    Lumbar multifidi Response  Twitch response elicited;Palpable increased muscle length           PT Education - 08/19/18 1517    Education Details  toileting and [id number] updates    Person(s) Educated  Patient    Methods  Explanation;Demonstration;Handout;Verbal cues    Comprehension  Verbalized understanding;Returned demonstration       PT Short Term Goals - 08/16/18 1331      PT SHORT TERM GOAL #1   Title  independent with intial HEP    Time  2    Period  Weeks    Status  New    Target Date  08/29/18        PT Long Term Goals - 08/15/18 2005      PT LONG TERM GOAL #1   Title  Pt will be able to cough or sneeze without urinary leakage    Baseline  leakage 2x/week    Time  8    Period  Weeks    Status  New    Target Date  10/10/18      PT LONG TERM GOAL #2   Title  Pt will report pain/pressure during intercourse  reduced by 50%    Time  8    Period  Weeks    Status  New    Target Date  10/10/18      PT LONG TERM GOAL #3   Title  Pt will be able to contract and hold pelvic floor contraction for at least 15 seconds for improved endurance to reduce the urge to void while working    Baseline  can hold 5 seconds    Time  8    Period  Weeks    Status  New    Target Date  10/10/18            Plan - 08/19/18 1518    Clinical Impression Statement  No goals met today due to first treatment since eval.  She had less tenderness and trigger points in levator ani and OI muscles.  Pt had a little difficulty relaxing after doing pelvic floor contraction.  She did well withstretches and had increased soft tissue length with dry needling and manual techniques.    Comorbidities  cystocele repair, hysterectomy, episiotomy with 3rd deg tear    PT Treatment/Interventions  ADLs/Self Care Home Management;Cryotherapy;Electrical Stimulation;Moist Heat;Traction;Ultrasound;Therapeutic exercise;Therapeutic activities;Patient/family education;Manual techniques;Dry needling;Taping;Biofeedback;Neuromuscular re-education;Passive range of motion    PT Next Visit Plan  pelvic floor strength and contract/relax, core strength, f/u on stretches, biofeedback    PT Home Exercise Plan  [email or web address]    Consulted and Agree with Plan of Care  Patient       Patient will benefit from skilled therapeutic intervention in order to improve the following deficits and impairments:  Decreased range of motion, Increased fascial restricitons, Increased muscle spasms, Pain, Improper body mechanics, Postural dysfunction, Decreased strength, Decreased coordination  Visit Diagnosis: 1. Other muscle spasm   2. Cervicalgia        Problem List Patient Active Problem List   Diagnosis Date Noted  . Left breast mass 04/05/2018  . Cervical radiculopathy at C5 06/14/2017  . Well woman exam without gynecological exam 06/20/2014  . Skin cancer, basal  cell 06/27/2013  . Hyperlipidemia   . Celiac disease     Jule Ser, PT 08/19/2018, 3:27 PM  [facility] [address], [place], [place], [address] Phone: [phone]   Fax:  [phone]  Name: [name] MRN: [id number] Date of Birth: March 23, 1980

## 2018-08-23 ENCOUNTER — Ambulatory Visit: Payer: 59

## 2018-08-25 ENCOUNTER — Other Ambulatory Visit: Payer: Self-pay

## 2018-08-25 ENCOUNTER — Ambulatory Visit: Payer: 59 | Attending: Obstetrics & Gynecology | Admitting: Physical Therapy

## 2018-08-25 ENCOUNTER — Encounter: Payer: Self-pay | Admitting: Physical Therapy

## 2018-08-25 DIAGNOSIS — M62838 Other muscle spasm: Secondary | ICD-10-CM | POA: Diagnosis not present

## 2018-08-25 DIAGNOSIS — M542 Cervicalgia: Secondary | ICD-10-CM | POA: Diagnosis not present

## 2018-08-25 NOTE — Patient Instructions (Signed)
Access Code: [email or web address]  URL: [email or web address]  Date: 08/25/2018  Prepared by: Jari Favre   Exercises  Child's Pose with Thread the Needle - 2 reps - 1 sets - 30 sec hold - 1x daily - 7x weekly  Supine Pelvic Floor Stretch - 3 reps - 1 sets - 30 sec hold - 1x daily - 7x weekly  Cat-Camel - 10 reps - 1 sets - 5 sec hold - 1x daily - 7x weekly  Static Prone on Elbows - 5 reps - 1 sets - 5 sec hold - 1x daily - 7x weekly  Seated Pelvic Floor Contraction with Isometric Hip Adduction - 10 reps - 1 sets - 4 sechold, 3 sec rest hold - 3x daily - 7x weekly  Standing Pelvic Floor Contraction - 10 reps - 1 sets - 4 sec hold - 3x daily - 7x weekly

## 2018-08-25 NOTE — Therapy (Signed)
[facility] [address], [place], [place], [address] Phone: [phone]   Fax:  [phone]  Physical Therapy Treatment  Patient Details  Name: [name] MRN: [id number] Date of Birth: 1980-05-26 Referring Provider (PT): Emily Filbert, MD   Encounter Date: 08/25/2018  PT End of Session - 08/25/18 1845    Visit Number  3    Date for PT Re-Evaluation  10/10/18    PT Start Time  1845    PT Stop Time  1930    PT Time Calculation (min)  45 min    Activity Tolerance  Patient tolerated treatment well    Behavior During Therapy  [facility] for tasks assessed/performed       Past Medical History:  Diagnosis Date  . Basal cell carcinoma of nasolabial crease 2014  . Celiac disease   . Hyperlipidemia    DIET CONTROLLED  . PONV (postoperative nausea and vomiting)   . SVD (spontaneous vaginal delivery)    X 2  . Uterine prolapse 08/2009    Past Surgical History:  Procedure Laterality Date  . BILATERAL SALPINGECTOMY Bilateral 01/24/2014   Procedure: BILATERAL SALPINGECTOMY;  Surgeon: Emily Filbert, MD;  Location: [facility];  Service: Gynecology;  Laterality: Bilateral;  . CYSTOCELE REPAIR N/A 01/24/2014   Procedure: ANTERIOR REPAIR (CYSTOCELE);  Surgeon: Emily Filbert, MD;  Location: [facility];  Service: Gynecology;  Laterality: N/A;  . SKIN CANCER EXCISION     NOSE X 3  . TONSILLECTOMY AND ADENOIDECTOMY  1995  . VAGINAL HYSTERECTOMY N/A 01/24/2014   Procedure: HYSTERECTOMY VAGINAL;  Surgeon: Emily Filbert, MD;  Location: [facility];  Service: Gynecology;  Laterality: N/A;  . WISDOM TOOTH EXTRACTION      There were no vitals filed for this visit.  Subjective Assessment - 08/25/18 1846    Subjective  Pt states there is no change with leakage.  Still leaking a little when coughing.  The toileting techniques helped    Currently in Pain?  No/denies                    Pelvic Floor Special Questions - 08/25/18 0001    Biofeedback  training: 4 sec hold- 4 sec relax; 6 sec hold-6 sec relax - done in sitting with ball squeeze and standing    Biofeedback sensor type  Surface        [facility] Adult PT Treatment/Exercise - 08/25/18 0001      Neuro Re-ed    Neuro Re-ed Details   rest 89mV, max for quick contract 15.99 mV; 10 sec hold 15.24mV; 20 sec hold 7.2 mV, rest post test              PT Education - 08/25/18 1929    Education Details  Access Code: [email or web address]    Person(s) Educated  Patient    Methods  Explanation;Demonstration;Handout;Verbal cues    Comprehension  Verbalized understanding;Returned demonstration       PT Short Term Goals - 08/25/18 1930      PT SHORT TERM GOAL #1   Title  independent with intial HEP    Status  Achieved        PT Long Term Goals - 08/15/18 2005      PT LONG TERM GOAL #1   Title  Pt will be able to cough or sneeze without urinary leakage    Baseline  leakage 2x/week    Time  8    Period  Weeks    Status  New    Target Date  10/10/18      PT LONG TERM GOAL #2   Title  Pt will report pain/pressure during intercourse reduced by 50%    Time  8    Period  Weeks    Status  New    Target Date  10/10/18      PT LONG TERM GOAL #3   Title  Pt will be able to contract and hold pelvic floor contraction for at least 15 seconds for improved endurance to reduce the urge to void while working    Baseline  can hold 5 seconds    Time  8    Period  Weeks    Status  New    Target Date  10/10/18            Plan - 08/25/18 1931    Clinical Impression Statement  Pt needed some cues on how to correctly engage pelvic floor.  She had the most difficulty in sitting but using the ball between her knees enabled the muscles to engage so she could understand what it felt like.  She did well withthe biofeedback training activities and is able to hold for 6 seconds up to 46mV.  She became fatigued at the end of the session.  Pt will benefit from continueing with POC to achieve  functional goals.    PT Treatment/Interventions  ADLs/Self Care Home Management;Cryotherapy;Electrical Stimulation;Moist Heat;Traction;Ultrasound;Therapeutic exercise;Therapeutic activities;Patient/family education;Manual techniques;Dry needling;Taping;Biofeedback;Neuromuscular re-education;Passive range of motion    PT Next Visit Plan  pelvic floor strength and endurance biofeedback    PT Home Exercise Plan  [email or web address]    Consulted and Agree with Plan of Care  Patient       Patient will benefit from skilled therapeutic intervention in order to improve the following deficits and impairments:  Decreased range of motion, Increased fascial restricitons, Increased muscle spasms, Pain, Improper body mechanics, Postural dysfunction, Decreased strength, Decreased coordination  Visit Diagnosis: 1. Other muscle spasm   2. Cervicalgia        Problem List Patient Active Problem List   Diagnosis Date Noted  . Left breast mass 04/05/2018  . Cervical radiculopathy at C5 06/14/2017  . Well woman exam without gynecological exam 06/20/2014  . Skin cancer, basal cell 06/27/2013  . Hyperlipidemia   . Celiac disease     Jule Ser, PT 08/25/2018, 7:36 PM  [facility] [address], [place], [place], [address] Phone: [phone]   Fax:  [phone]  Name: [name] MRN: [id number] Date of Birth: [birth year]

## 2018-08-30 ENCOUNTER — Encounter: Payer: Self-pay | Admitting: Physical Therapy

## 2018-08-30 ENCOUNTER — Ambulatory Visit: Payer: 59 | Admitting: Physical Therapy

## 2018-08-30 ENCOUNTER — Other Ambulatory Visit: Payer: Self-pay

## 2018-08-30 DIAGNOSIS — M62838 Other muscle spasm: Secondary | ICD-10-CM

## 2018-08-30 DIAGNOSIS — M542 Cervicalgia: Secondary | ICD-10-CM

## 2018-08-30 NOTE — Patient Instructions (Signed)
Access Code: [email or web address]  URL: [email or web address]  Date: 08/30/2018  Prepared by: Jari Favre   Exercises  Child's Pose with Thread the Needle - 2 reps - 1 sets - 30 sec hold - 1x daily - 7x weekly  Supine Pelvic Floor Stretch - 3 reps - 1 sets - 30 sec hold - 1x daily - 7x weekly  Cat-Camel - 10 reps - 1 sets - 5 sec hold - 1x daily - 7x weekly  Static Prone on Elbows - 5 reps - 1 sets - 5 sec hold - 1x daily - 7x weekly  Seated Pelvic Floor Contraction with Isometric Hip Adduction - 10 reps - 1 sets - 4 sechold, 3 sec rest hold - 3x daily - 7x weekly  Standing Pelvic Floor Contraction - 10 reps - 1 sets - 4 sec hold - 3x daily - 7x weekly  Single Leg Balance with Pelvic Floor Contraction - 10 reps - 1 sets - 1x daily - 7x weekly  Lunge with Pelvic Floor Contraction - 10 reps - 1 sets - 1x daily - 7x weekly  Sit to Stand with Pelvic Floor Contraction - 10 reps - 1 sets - 1x daily - 7x weekly  Standing Hip Abduction with Theraband Resistance - 10 reps - 1 sets - 1x daily - 7x weekly  Standing Hip Flexion with Resistance Loop - 10 reps - 1 sets - 1x daily - 7x weekly

## 2018-08-30 NOTE — Therapy (Signed)
[facility] [address], [place], [place], [address] Phone: [phone]   Fax:  [phone]  Physical Therapy Treatment  Patient Details  Name: [name] MRN: [id number] Date of Birth: 31-Oct-1980 Referring Provider (PT): Emily Filbert, MD   Encounter Date: 08/30/2018  PT End of Session - 08/30/18 1525    Visit Number  4    Date for PT Re-Evaluation  10/10/18    PT Start Time  1525    PT Stop Time  1615    PT Time Calculation (min)  50 min    Activity Tolerance  Patient tolerated treatment well    Behavior During Therapy  [facility] for tasks assessed/performed       Past Medical History:  Diagnosis Date  . Basal cell carcinoma of nasolabial crease 2014  . Celiac disease   . Hyperlipidemia    DIET CONTROLLED  . PONV (postoperative nausea and vomiting)   . SVD (spontaneous vaginal delivery)    X 2  . Uterine prolapse 08/2009    Past Surgical History:  Procedure Laterality Date  . BILATERAL SALPINGECTOMY Bilateral 01/24/2014   Procedure: BILATERAL SALPINGECTOMY;  Surgeon: Emily Filbert, MD;  Location: [facility];  Service: Gynecology;  Laterality: Bilateral;  . CYSTOCELE REPAIR N/A 01/24/2014   Procedure: ANTERIOR REPAIR (CYSTOCELE);  Surgeon: Emily Filbert, MD;  Location: [facility];  Service: Gynecology;  Laterality: N/A;  . SKIN CANCER EXCISION     NOSE X 3  . TONSILLECTOMY AND ADENOIDECTOMY  1995  . VAGINAL HYSTERECTOMY N/A 01/24/2014   Procedure: HYSTERECTOMY VAGINAL;  Surgeon: Emily Filbert, MD;  Location: [facility];  Service: Gynecology;  Laterality: N/A;  . WISDOM TOOTH EXTRACTION      There were no vitals filed for this visit.  Subjective Assessment - 08/30/18 1630    Subjective  Pt has not had any leakage since last visit.  Reports no pain at this time.    Patient Stated Goals  have less pain and burning                    Pelvic Floor Special Questions - 08/30/18 0001    Biofeedback  used during  exercises for portion of the treatment        [facility] Adult PT Treatment/Exercise - 08/30/18 0001      Neuro Re-ed    Neuro Re-ed Details   standing weight shifts with biofeedback, hip abduction      Lumbar Exercises: Standing   Functional Squats  10 reps    Forward Lunge  10 reps    Side Lunge  10 reps    Other Standing Lumbar Exercises  hip abduction and flexion yellow band - kegel 4 sec hold - 10x each side    Other Standing Lumbar Exercises  heel slides 3 ways - contracting pelvic floor      Lumbar Exercises: Seated   Sit to Stand  10 reps   with kegel            PT Education - 08/30/18 1640    Education provided  Yes    Education Details  Access Code: [email or web address]    Person(s) Educated  Patient    Methods  Explanation;Demonstration;Verbal cues;Handout    Comprehension  Verbalized understanding;Returned demonstration       PT Short Term Goals - 08/25/18 1930      PT SHORT TERM GOAL #1   Title  independent with intial HEP  Status  Achieved        PT Long Term Goals - 08/15/18 2005      PT LONG TERM GOAL #1   Title  Pt will be able to cough or sneeze without urinary leakage    Baseline  leakage 2x/week    Time  8    Period  Weeks    Status  New    Target Date  10/10/18      PT LONG TERM GOAL #2   Title  Pt will report pain/pressure during intercourse reduced by 50%    Time  8    Period  Weeks    Status  New    Target Date  10/10/18      PT LONG TERM GOAL #3   Title  Pt will be able to contract and hold pelvic floor contraction for at least 15 seconds for improved endurance to reduce the urge to void while working    Baseline  can hold 5 seconds    Time  8    Period  Weeks    Status  New    Target Date  10/10/18            Plan - 08/30/18 1631    Clinical Impression Statement  Pt has done well since last visit.  Pt's appointments were changed to reflect 1x/week transition.  Pt did well with biofeedback and then did several exercises in the  gym without biofeedback since she was able to feel for herself when the muscles were engaged.  Pt will continue to benefit from skilled PT to ensure successful transition to HEP.    PT Treatment/Interventions  ADLs/Self Care Home Management;Cryotherapy;Electrical Stimulation;Moist Heat;Traction;Ultrasound;Therapeutic exercise;Therapeutic activities;Patient/family education;Manual techniques;Dry needling;Taping;Biofeedback;Neuromuscular re-education;Passive range of motion    PT Next Visit Plan  pelvic floor strength and endurance    PT Home Exercise Plan  [email or web address]    Consulted and Agree with Plan of Care  Patient       Patient will benefit from skilled therapeutic intervention in order to improve the following deficits and impairments:  Decreased range of motion, Increased fascial restricitons, Increased muscle spasms, Pain, Improper body mechanics, Postural dysfunction, Decreased strength, Decreased coordination  Visit Diagnosis: 1. Other muscle spasm   2. Cervicalgia        Problem List Patient Active Problem List   Diagnosis Date Noted  . Left breast mass 04/05/2018  . Cervical radiculopathy at C5 06/14/2017  . Well woman exam without gynecological exam 06/20/2014  . Skin cancer, basal cell 06/27/2013  . Hyperlipidemia   . Celiac disease     Jule Ser, PT 08/30/2018, 4:40 PM  [facility] [address], [place], [place], [address] Phone: [phone]   Fax:  [phone]  Name: [name] MRN: [id number] Date of Birth: 02/08/1981

## 2018-09-02 ENCOUNTER — Telehealth: Payer: Self-pay

## 2018-09-02 NOTE — Telephone Encounter (Signed)
Questions for Screening COVID-19  Symptom onset: NONE, Pt stated No COVID+ contacts  Travel or Contacts: NONE During this illness, did/does the patient experience any of the following symptoms? Fever >100.60F []   Yes [x]   No []   Unknown Subjective fever (felt feverish) []   Yes [x]   No []   Unknown Chills []   Yes [x]   No []   Unknown Muscle aches (myalgia) []   Yes [x]   No []   Unknown Runny nose (rhinorrhea) []   Yes [x]   No []   Unknown Sore throat []   Yes [x]   No []   Unknown Cough (new onset or worsening of chronic cough) []   Yes []   No []   Unknown Shortness of breath (dyspnea) []   Yes [x]   No []   Unknown Nausea or vomiting []   Yes [x]   No []   Unknown Headache []   Yes [x]   No []   Unknown Abdominal pain  []   Yes [x]   No []   Unknown Diarrhea (?3 loose/looser than normal stools/24hr period) []   Yes [x]   No []   Unknown Other, specify:  Patient risk factors: Smoker? []   Current []   Former [x]   Never If female, currently pregnant? []   Yes [x]   No  Patient Active Problem List   Diagnosis Date Noted  . Well woman exam without gynecological exam 06/20/2014  . Skin cancer, basal cell 06/27/2013  . Hyperlipidemia   . Celiac disease     Plan:  []   High risk for COVID-19 with red flags go to ED (with CP, SOB, weak/lightheaded, or fever > 101.5). Call ahead.  []   High risk for COVID-19 but stable. Inform provider and coordinate time for [facility] visit.   [x]   No red flags but URI signs or symptoms okay for [facility] visit.

## 2018-09-02 NOTE — Progress Notes (Signed)
Subjective:   Patient ID: [name], female    DOB: [birth year], 38 y.o.   MRN: [id number]  [name] is a pleasant 38 y.o. year old female who presents to clinic today with Annual Exam (Pt screened at vehicle.  She is ihere today for a CPE without PAP as she is followed by Dr. Hulan Fray last being 7.3.2019.  Immunizations are UTD.  Mammogram UTD as of 2.2020.  She is not currently fasting.)  on 09/05/2018  HPI:  Health Maintenance  Topic Date Due  . INFLUENZA VACCINE  09/24/2018  . TETANUS/TDAP  09/16/2023  . HIV Screening  Completed  . PAP SMEAR-Modifier  Discontinued   Pap smear- done by Dr. Hulan Fray last but she has had a TAHBSO (2015). Last saw Dr. Hulan Fray on 08/25/17- note reviewed.  Bimanual exam was done at that [facility]. Given her multiple pelvic surgeries (,cystocele repair, hysterectomy with BSO, episiotomy with 3rd deg tear),  Dr. Hulan Fray did refer her to PT for pelvic floor weakness symptoms of - strong urges to void, rectal pain with intercourse, feeling like bladder isn't emptying all the way, and leakage when laughing.  She has been seeing Jari Favre, physical therapist.  Last saw her on 08/30/18- note reviewed.  PT's assessment at the 7/7 visit were as follows -  Clinical Impression Statement  Pt has done well since last visit.  Pt's appointments were changed to reflect 1x/week transition.  Pt did well with biofeedback and then did several exercises in the gym without biofeedback since she was able to feel for herself when the muscles were engaged.  Pt will continue to benefit from skilled PT to ensure successful transition to HEP.    She has future appointments scheduled.  She did have a mammogram and left breast US in 2/20 because of left breast mass- reviewed, negative.   H/o BCC- has had several recently removed.  HLD- Lab Results  Component Value Date   CHOL 154 06/15/2017   HDL 57.60 06/15/2017   LDLCALC 89 06/15/2017   TRIG 35.0 06/15/2017   CHOLHDL 3 06/15/2017     . Current Outpatient Medications on File Prior to Visit  Medication Sig Dispense Refill  . cetirizine (ZYRTEC) 10 MG tablet Take 1 tablet (10 mg total) by mouth daily. 90 tablet 3  . esomeprazole (NEXIUM) 40 MG capsule Take 40 mg by mouth daily at 12 noon.    . fluticasone (FLONASE) 50 MCG/ACT nasal spray Place 2 sprays into both nostrils daily. 48 g 3  . Multiple Vitamin (MULTIVITAMIN) tablet Take 1 tablet by mouth daily.     No current facility-administered medications on file prior to visit.     No Known Allergies  Past Medical History:  Diagnosis Date  . Basal cell carcinoma of nasolabial crease 2014  . Celiac disease   . Hyperlipidemia    DIET CONTROLLED  . PONV (postoperative nausea and vomiting)   . SVD (spontaneous vaginal delivery)    X 2  . Uterine prolapse 08/2009    Past Surgical History:  Procedure Laterality Date  . BILATERAL SALPINGECTOMY Bilateral 01/24/2014   Procedure: BILATERAL SALPINGECTOMY;  Surgeon: Emily Filbert, MD;  Location: [facility];  Service: Gynecology;  Laterality: Bilateral;  . CYSTOCELE REPAIR N/A 01/24/2014   Procedure: ANTERIOR REPAIR (CYSTOCELE);  Surgeon: Emily Filbert, MD;  Location: [facility];  Service: Gynecology;  Laterality: N/A;  . SKIN CANCER EXCISION     NOSE X 3  . TONSILLECTOMY AND ADENOIDECTOMY  [place] N/A 01/24/2014   Procedure: HYSTERECTOMY VAGINAL;  Surgeon: Emily Filbert, MD;  Location: [facility];  Service: Gynecology;  Laterality: N/A;  . WISDOM TOOTH EXTRACTION      Family History  Problem Relation Age of Onset  . Hypothyroidism Mother   . Hyperlipidemia Father   . Celiac disease Sister   . Hashimoto's thyroiditis Sister   . Heart disease Paternal Grandfather 51       multiple CABG  . Hyperlipidemia Paternal Grandfather   . Cancer Paternal Grandmother        pancreatic cancer    Social History   Socioeconomic History  . Marital status: Married    Spouse name: Not on file  . Number of children: Not  on file  . Years of education: Not on file  . Highest education level: Not on file  Occupational History  . Not on file  Social Needs  . Financial resource strain: Not on file  . Food insecurity    Worry: Not on file    Inability: Not on file  . Transportation needs    Medical: Not on file    Non-medical: Not on file  Tobacco Use  . Smoking status: Never Smoker  . Smokeless tobacco: Never Used  Substance and Sexual Activity  . Alcohol use: Yes    Comment: occassionally  . Drug use: No  . Sexual activity: Yes    Partners: Male    Birth control/protection: Surgical  Lifestyle  . Physical activity    Days per week: Not on file    Minutes per session: Not on file  . Stress: Not on file  Relationships  . Social Herbalist on phone: Not on file    Gets together: Not on file    Attends religious service: Not on file    Active member of club or organization: Not on file    Attends meetings of clubs or organizations: Not on file    Relationship status: Not on file  . Intimate partner violence    Fear of current or ex partner: Not on file    Emotionally abused: Not on file    Physically abused: Not on file    Forced sexual activity: Not on file  Other Topics Concern  . 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   Recently moved here from [facility].   Married, 1 daughter named [name] (born 2011).   The PMH, PSH, Social History, Family History, Medications, and allergies have been reviewed in [facility], and have been updated if relevant.      Review of Systems  Constitutional: Negative.   HENT: Negative.   Eyes: Negative.   Respiratory: Negative.   Cardiovascular: Negative.   Gastrointestinal: Negative.   Endocrine: Negative.   Genitourinary: Negative.   Musculoskeletal: Negative.   Neurological: Negative for dizziness, weakness and numbness.  Hematological: Negative.   Psychiatric/Behavioral: Negative.   All other systems  reviewed and are negative.      Objective:    BP 102/60 (BP Location: Left Arm, Patient Position: Sitting, Cuff Size: Normal)   Pulse 75   Temp 98.4 F (36.9 C) (Oral)   Ht 5' 2.5" (1.588 m)   Wt 124 lb 6.4 oz (56.4 kg)   LMP 03/08/2013   SpO2 99%   BMI 22.39 kg/m   Wt Readings from Last 3 Encounters:  09/05/18 124 lb 6.4 oz (56.4 kg)  04/05/18  120 lb 6.4 oz (54.6 kg)  08/25/17 122 lb (55.3 kg)    Physical Exam   General:  Well-developed,well-nourished,in no acute distress; alert,appropriate and cooperative throughout examination Head:  normocephalic and atraumatic.   Eyes:  vision grossly intact, PERRL Ears:  R ear normal and L ear normal externally, TMs clear bilaterally Nose:  no external deformity.   Mouth:  good dentition.   Neck:  No deformities, masses, or tenderness noted. Breasts:  No mass, nodules, thickening, tenderness, bulging, retraction, inflamation, nipple discharge or skin changes noted.   Lungs:  Normal respiratory effort, chest expands symmetrically. Lungs are clear to auscultation, no crackles or wheezes. Heart:  Normal rate and regular rhythm. S1 and S2 normal without gallop, murmur, click, rub or other extra sounds. Abdomen:  Bowel sounds positive,abdomen soft and non-tender without masses, organomegaly or hernias noted. Msk:  No deformity or scoliosis noted of thoracic or lumbar spine.   Extremities:  No clubbing, cyanosis, edema, or deformity noted with normal full range of motion of all joints.   Neurologic:  alert & oriented X3 and gait normal.   Skin:  Intact without suspicious lesions or rashes Cervical Nodes:  No lymphadenopathy noted Axillary Nodes:  No palpable lymphadenopathy Psych:  Cognition and judgment appear intact. Alert and cooperative with normal attention span and concentration. No apparent delusions, illusions, hallucinations

## 2018-09-05 ENCOUNTER — Other Ambulatory Visit: Payer: Self-pay

## 2018-09-05 ENCOUNTER — Ambulatory Visit (INDEPENDENT_AMBULATORY_CARE_PROVIDER_SITE_OTHER): Payer: 59 | Admitting: Family Medicine

## 2018-09-05 ENCOUNTER — Encounter: Payer: Self-pay | Admitting: Family Medicine

## 2018-09-05 ENCOUNTER — Ambulatory Visit: Payer: 59 | Admitting: Physical Therapy

## 2018-09-05 ENCOUNTER — Encounter: Payer: Self-pay | Admitting: Physical Therapy

## 2018-09-05 VITALS — BP 102/60 | HR 75 | Temp 98.4°F | Ht 62.5 in | Wt 124.4 lb

## 2018-09-05 DIAGNOSIS — M62838 Other muscle spasm: Secondary | ICD-10-CM | POA: Diagnosis not present

## 2018-09-05 DIAGNOSIS — N8189 Other female genital prolapse: Secondary | ICD-10-CM | POA: Insufficient documentation

## 2018-09-05 DIAGNOSIS — E785 Hyperlipidemia, unspecified: Secondary | ICD-10-CM

## 2018-09-05 DIAGNOSIS — Z0000 Encounter for general adult medical examination without abnormal findings: Secondary | ICD-10-CM | POA: Diagnosis not present

## 2018-09-05 DIAGNOSIS — C4491 Basal cell carcinoma of skin, unspecified: Secondary | ICD-10-CM | POA: Diagnosis not present

## 2018-09-05 DIAGNOSIS — M542 Cervicalgia: Secondary | ICD-10-CM

## 2018-09-05 LAB — CBC WITH DIFFERENTIAL/PLATELET
Basophils Absolute: 0 10*3/uL (ref 0.0–0.1)
Basophils Relative: 0.5 % (ref 0.0–3.0)
Eosinophils Absolute: 0.1 10*3/uL (ref 0.0–0.7)
Eosinophils Relative: 1.9 % (ref 0.0–5.0)
HCT: 39.7 % (ref 36.0–46.0)
Hemoglobin: 13.3 g/dL (ref 12.0–15.0)
Lymphocytes Relative: 33 % (ref 12.0–46.0)
Lymphs Abs: 1.6 10*3/uL (ref 0.7–4.0)
MCHC: 33.5 g/dL (ref 30.0–36.0)
MCV: 96.8 fl (ref 78.0–100.0)
Monocytes Absolute: 0.3 10*3/uL (ref 0.1–1.0)
Monocytes Relative: 7 % (ref 3.0–12.0)
Neutro Abs: 2.8 10*3/uL (ref 1.4–7.7)
Neutrophils Relative %: 57.6 % (ref 43.0–77.0)
Platelets: 192 10*3/uL (ref 150.0–400.0)
RBC: 4.1 Mil/uL (ref 3.87–5.11)
RDW: 12.7 % (ref 11.5–15.5)
WBC: 4.9 10*3/uL (ref 4.0–10.5)

## 2018-09-05 LAB — COMPREHENSIVE METABOLIC PANEL
ALT: 14 U/L (ref 0–35)
AST: 20 U/L (ref 0–37)
Albumin: 4.4 g/dL (ref 3.5–5.2)
Alkaline Phosphatase: 40 U/L (ref 39–117)
BUN: 24 mg/dL — ABNORMAL HIGH (ref 6–23)
CO2: 25 mEq/L (ref 19–32)
Calcium: 9 mg/dL (ref 8.4–10.5)
Chloride: 105 mEq/L (ref 96–112)
Creatinine, Ser: 0.73 mg/dL (ref 0.40–1.20)
GFR: 88.98 mL/min (ref >60.00)
Glucose, Bld: 88 mg/dL (ref 70–99)
Potassium: 3.6 mEq/L (ref 3.5–5.1)
Sodium: 138 mEq/L (ref 135–145)
Total Bilirubin: 0.5 mg/dL (ref 0.2–1.2)
Total Protein: 6.9 g/dL (ref 6.0–8.3)

## 2018-09-05 LAB — LIPID PANEL
Cholesterol: 156 mg/dL (ref 0–200)
HDL: 51.3 mg/dL (ref >39.00)
LDL Cholesterol: 93 mg/dL (ref 0–99)
NonHDL: 104.6
Total CHOL/HDL Ratio: 3
Triglycerides: 59 mg/dL (ref 0.0–149.0)
VLDL: 11.8 mg/dL (ref 0.0–40.0)

## 2018-09-05 LAB — TSH: TSH: 1.14 u[IU]/mL (ref 0.35–4.50)

## 2018-09-05 NOTE — Assessment & Plan Note (Signed)
Attending PT which has been beneficial.

## 2018-09-05 NOTE — Assessment & Plan Note (Signed)
Labs today. Orders Placed This Encounter  Procedures  . CBC with Differential/Platelet  . Comprehensive metabolic panel  . Lipid panel  . TSH

## 2018-09-05 NOTE — Assessment & Plan Note (Signed)
Followed closely by derm. Has had multiple removed.

## 2018-09-05 NOTE — Therapy (Signed)
[facility] [address] [place], [place], [address] Phone: [phone]   Fax:  [phone]  Physical Therapy Treatment  Patient Details  Name: [name] MRN: [id number] Date of Birth: [birth year] Referring Provider (PT): Emily Filbert, MD   Encounter Date: 09/05/2018  PT End of Session - 09/05/18 1350    Visit Number  5    Date for PT Re-Evaluation  10/10/18    PT Start Time  2122    PT Stop Time  1425    PT Time Calculation (min)  40 min    Activity Tolerance  Patient tolerated treatment well    Behavior During Therapy  [facility] for tasks assessed/performed       Past Medical History:  Diagnosis Date  . Basal cell carcinoma of nasolabial crease 2014  . Celiac disease   . Hyperlipidemia    DIET CONTROLLED  . PONV (postoperative nausea and vomiting)   . SVD (spontaneous vaginal delivery)    X 2  . Uterine prolapse 08/2009    Past Surgical History:  Procedure Laterality Date  . BILATERAL SALPINGECTOMY Bilateral 01/24/2014   Procedure: BILATERAL SALPINGECTOMY;  Surgeon: Emily Filbert, MD;  Location: [facility];  Service: Gynecology;  Laterality: Bilateral;  . CYSTOCELE REPAIR N/A 01/24/2014   Procedure: ANTERIOR REPAIR (CYSTOCELE);  Surgeon: Emily Filbert, MD;  Location: [facility];  Service: Gynecology;  Laterality: N/A;  . SKIN CANCER EXCISION     NOSE X 3  . TONSILLECTOMY AND ADENOIDECTOMY  1995  . VAGINAL HYSTERECTOMY N/A 01/24/2014   Procedure: HYSTERECTOMY VAGINAL;  Surgeon: Emily Filbert, MD;  Location: [facility];  Service: Gynecology;  Laterality: N/A;  . WISDOM TOOTH EXTRACTION      There were no vitals filed for this visit.  Subjective Assessment - 09/05/18 1430    Subjective  States she has not had leakage or pain since last visit.  Reports there was one time where she was couging and did not leak.    Patient Stated Goals  have less pain and burning    Currently in Pain?  No/denies                        [facility] Adult PT Treatment/Exercise - 09/05/18 0001      Lumbar Exercises: Standing   Lifting  From waist;20 reps;Weights    Lifting Weights (lbs)  7    Lifting Limitations  TrA engaged    Side Lunge Limitations  heel slides 3 ways - 10x each - brace    Other Standing Lumbar Exercises  rebounder 50mini jumps holding kegel 5x; jump squat fwd with TrA and kegel- 10x    Other Standing Lumbar Exercises  green band arms out stretched      Lumbar Exercises: Supine   Straight Leg Raise  10 reps   10 sec hold with kegel - Rt/Lt     Lumbar Exercises: Sidelying   Other Sidelying Lumbar Exercises  hip adduction Rt/Lt - 10 sec hold with kegel      Lumbar Exercises: Quadruped   Other Quadruped Lumbar Exercises  fire hydrants -cues to keep pelvis level- 10 x each side               PT Short Term Goals - 08/25/18 1930      PT SHORT TERM GOAL #1   Title  independent with intial HEP    Status  Achieved  PT Long Term Goals - 09/05/18 1402      PT LONG TERM GOAL #1   Title  Pt will be able to cough or sneeze without urinary leakage    Baseline  no leakage for one week    Status  On-going      PT LONG TERM GOAL #2   Title  Pt will report pain/pressure during intercourse reduced by 50%    Baseline  no pain for 2 weeks    Status  On-going      PT LONG TERM GOAL #3   Title  Pt will be able to contract and hold pelvic floor contraction for at least 15 seconds for improved endurance to reduce the urge to void while working    Status  On-going            Plan - 09/05/18 1432    Clinical Impression Statement  Pt has made excellent progress towards her goals.  She is doing a lot of pelvic floor strengthening incorporated with the exercise videos she does at home.  She was educated in engaging her pelvic floor jumping today and able to perform correctly.  Pt needed some cues to keep back flat during quadruped.  Pt will benefit from skilled PT  to ensure successful transition to HEP.    PT Treatment/Interventions  ADLs/Self Care Home Management;Cryotherapy;Electrical Stimulation;Moist Heat;Traction;Ultrasound;Therapeutic exercise;Therapeutic activities;Patient/family education;Manual techniques;Dry needling;Taping;Biofeedback;Neuromuscular re-education;Passive range of motion    PT Next Visit Plan  pelvic floor strength and endurance, review final HEP, possible discharge    PT Home Exercise Plan  [id number]    Consulted and Agree with Plan of Care  Patient       Patient will benefit from skilled therapeutic intervention in order to improve the following deficits and impairments:  Decreased range of motion, Increased fascial restricitons, Increased muscle spasms, Pain, Improper body mechanics, Postural dysfunction, Decreased strength, Decreased coordination  Visit Diagnosis: 1. Other muscle spasm   2. Cervicalgia        Problem List Patient Active Problem List   Diagnosis Date Noted  . Pelvic floor weakness in female 09/05/2018  . Well woman exam without gynecological exam 06/20/2014  . Skin cancer, basal cell 06/27/2013  . Hyperlipidemia   . Celiac disease     Jule Ser, PT 09/05/2018, 3:34 PM  [facility] [address], [place], [place], [address] Phone: [phone]   Fax:  [phone]  Name: [name] MRN: [id number] Date of Birth: [birth year]

## 2018-09-05 NOTE — Patient Instructions (Signed)
Great to see you. I will call you with your lab results from today and you can view them online.    Please give my love to Bishop, your mom and the girls.

## 2018-09-05 NOTE — Assessment & Plan Note (Signed)
Reviewed preventive care protocols, scheduled due services, and updated immunizations Discussed nutrition, exercise, diet, and healthy lifestyle.  

## 2018-09-07 ENCOUNTER — Encounter: Payer: Self-pay | Admitting: Radiology

## 2018-09-09 ENCOUNTER — Encounter: Payer: 59 | Admitting: Physical Therapy

## 2018-09-14 ENCOUNTER — Encounter: Payer: Self-pay | Admitting: Physical Therapy

## 2018-09-14 ENCOUNTER — Other Ambulatory Visit: Payer: Self-pay

## 2018-09-14 ENCOUNTER — Ambulatory Visit: Payer: 59 | Admitting: Physical Therapy

## 2018-09-14 DIAGNOSIS — M62838 Other muscle spasm: Secondary | ICD-10-CM

## 2018-09-14 DIAGNOSIS — M542 Cervicalgia: Secondary | ICD-10-CM | POA: Diagnosis not present

## 2018-09-14 NOTE — Therapy (Signed)
[facility] [address] [place], [place], [address] Phone: [phone]   Fax:  [phone]  Physical Therapy Treatment  Patient Details  Name: [name] MRN: [id number] Date of Birth: Jun 27, 1980 Referring Provider (PT): Emily Filbert, MD   Encounter Date: 09/14/2018  PT End of Session - 09/14/18 1531    Visit Number  6    Date for PT Re-Evaluation  10/10/18    PT Start Time  4401    PT Stop Time  1611    PT Time Calculation (min)  38 min    Activity Tolerance  Patient tolerated treatment well    Behavior During Therapy  [facility] for tasks assessed/performed       Past Medical History:  Diagnosis Date  . Basal cell carcinoma of nasolabial crease 2014  . Celiac disease   . Hyperlipidemia    DIET CONTROLLED  . PONV (postoperative nausea and vomiting)   . SVD (spontaneous vaginal delivery)    X 2  . Uterine prolapse 08/2009    Past Surgical History:  Procedure Laterality Date  . BILATERAL SALPINGECTOMY Bilateral 01/24/2014   Procedure: BILATERAL SALPINGECTOMY;  Surgeon: Emily Filbert, MD;  Location: [facility];  Service: Gynecology;  Laterality: Bilateral;  . CYSTOCELE REPAIR N/A 01/24/2014   Procedure: ANTERIOR REPAIR (CYSTOCELE);  Surgeon: Emily Filbert, MD;  Location: [facility];  Service: Gynecology;  Laterality: N/A;  . SKIN CANCER EXCISION     NOSE X 3  . TONSILLECTOMY AND ADENOIDECTOMY  1995  . VAGINAL HYSTERECTOMY N/A 01/24/2014   Procedure: HYSTERECTOMY VAGINAL;  Surgeon: Emily Filbert, MD;  Location: [facility];  Service: Gynecology;  Laterality: N/A;  . WISDOM TOOTH EXTRACTION      There were no vitals filed for this visit.  Subjective Assessment - 09/14/18 1535    Subjective  States she is doing well with everything on her own.  Pt had a coughing fit                    Pelvic Floor Special Questions - 09/14/18 0001    Pelvic Floor Internal Exam  pt identity confirmed and consent given to perform  internal soft tissue assessment    Strength  fair squeeze, definite lift    Strength # of seconds  7        [facility] Adult PT Treatment/Exercise - 09/14/18 0001      Lumbar Exercises: Supine   Other Supine Lumbar Exercises  swiss ball ex: supine passing UE to LE, piriformis stretch, lumbar rotation, sitting LAQ       anti rotation ex - green band - half kneel, tandem and single leg - 20 x each way      PT Education - 09/14/18 1621    Education provided  Yes    Education Details  Access Code: [employer]    Person(s) Educated  Patient    Methods  Explanation;Demonstration;Handout;Verbal cues;Tactile cues    Comprehension  Verbalized understanding;Returned demonstration       PT Short Term Goals - 08/25/18 1930      PT SHORT TERM GOAL #1   Title  independent with intial HEP    Status  Achieved        PT Long Term Goals - 09/14/18 1623      PT LONG TERM GOAL #1   Title  Pt will be able to cough or sneeze without urinary leakage    Baseline  no  leakage    Status  Achieved      PT LONG TERM GOAL #2   Title  Pt will report pain/pressure during intercourse reduced by 50%    Baseline  no pain    Status  Achieved      PT LONG TERM GOAL #3   Title  Pt will be able to contract and hold pelvic floor contraction for at least 15 seconds for improved endurance to reduce the urge to void while working    Baseline  7 sec    Status  Partially Met            Plan - 09/14/18 1622    Clinical Impression Statement  Pt has met most goals.  She is ind with HEP and able to continue to progress strengthening and endurance on her own at this time.  Discharged from skilled PT today    PT Treatment/Interventions  ADLs/Self Care Home Management;Cryotherapy;Electrical Stimulation;Moist Heat;Traction;Ultrasound;Therapeutic exercise;Therapeutic activities;Patient/family education;Manual techniques;Dry needling;Taping;Biofeedback;Neuromuscular re-education;Passive range of motion    PT Home  Exercise Plan  [email or web address]    Consulted and Agree with Plan of Care  Patient       Patient will benefit from skilled therapeutic intervention in order to improve the following deficits and impairments:  Decreased range of motion, Increased fascial restricitons, Increased muscle spasms, Pain, Improper body mechanics, Postural dysfunction, Decreased strength, Decreased coordination  Visit Diagnosis: No diagnosis found.     Problem List Patient Active Problem List   Diagnosis Date Noted  . Pelvic floor weakness in female 09/05/2018  . Well woman exam without gynecological exam 06/20/2014  . Skin cancer, basal cell 06/27/2013  . Hyperlipidemia   . Celiac disease     Jule Ser, PT 09/14/2018, 4:23 PM  [facility] [address] [place], [place], [address] Phone: [phone]   Fax:  [phone]  Name: AARION KITTRELL MRN: [id number] Date of Birth: 26-Dec-1980

## 2018-09-14 NOTE — Patient Instructions (Signed)
Access Code: [email or web address]  URL: [email or web address]  Date: 09/14/2018  Prepared by: Jari Favre   Exercises  Child's Pose with Thread the Needle - 2 reps - 1 sets - 30 sec hold - 1x daily - 7x weekly  Supine Pelvic Floor Stretch - 3 reps - 1 sets - 30 sec hold - 1x daily - 7x weekly  Cat-Camel - 10 reps - 1 sets - 5 sec hold - 1x daily - 7x weekly  Static Prone on Elbows - 5 reps - 1 sets - 5 sec hold - 1x daily - 7x weekly  Seated Pelvic Floor Contraction with Isometric Hip Adduction - 10 reps - 1 sets - 4 sechold, 3 sec rest hold - 3x daily - 7x weekly  Standing Pelvic Floor Contraction - 10 reps - 1 sets - 4 sec hold - 3x daily - 7x weekly  Single Leg Balance with Pelvic Floor Contraction - 10 reps - 1 sets - 1x daily - 7x weekly  Lunge with Pelvic Floor Contraction - 10 reps - 1 sets - 1x daily - 7x weekly  Sit to Stand with Pelvic Floor Contraction - 10 reps - 1 sets - 1x daily - 7x weekly  Standing Hip Abduction with Theraband Resistance - 10 reps - 1 sets - 1x daily - 7x weekly  Standing Hip Flexion with Resistance Loop - 10 reps - 1 sets - 1x daily - 7x weekly  Half Kneeling Anti-Rotation Press - Forward Leg Anchor Side - 10 reps - 3 sets - 1x daily - 7x weekly  Squatting Anti-Rotation Press - 10 reps - 3 sets - 1x daily - 7x weekly  Swiss Ball March and Kick - 10 reps - 3 sets - 1x daily - 7x weekly

## 2018-10-19 ENCOUNTER — Other Ambulatory Visit: Payer: Self-pay | Admitting: Family Medicine

## 2018-10-19 ENCOUNTER — Encounter: Payer: Self-pay | Admitting: Family Medicine

## 2018-10-19 MED ORDER — METHYLPREDNISOLONE 4 MG PO TBPK: ORAL_TABLET | ORAL | 0 refills | Status: DC

## 2018-10-19 MED FILL — METHYLPREDNISOLONE 4 MG TAB: 4 | 6 days supply | Qty: 21 | Fill #0

## 2018-11-03 DIAGNOSIS — M25511 Pain in right shoulder: Secondary | ICD-10-CM | POA: Insufficient documentation

## 2018-11-04 DIAGNOSIS — M25511 Pain in right shoulder: Secondary | ICD-10-CM | POA: Diagnosis not present

## 2018-11-04 DIAGNOSIS — M542 Cervicalgia: Secondary | ICD-10-CM | POA: Diagnosis not present

## 2018-11-04 DIAGNOSIS — M25512 Pain in left shoulder: Secondary | ICD-10-CM | POA: Diagnosis not present

## 2018-11-11 DIAGNOSIS — M542 Cervicalgia: Secondary | ICD-10-CM | POA: Diagnosis not present

## 2018-11-21 ENCOUNTER — Encounter: Payer: Self-pay | Admitting: Family Medicine

## 2018-12-13 DIAGNOSIS — M5030 Other cervical disc degeneration, unspecified cervical region: Secondary | ICD-10-CM | POA: Diagnosis not present

## 2018-12-14 DIAGNOSIS — M542 Cervicalgia: Secondary | ICD-10-CM | POA: Diagnosis not present

## 2018-12-19 ENCOUNTER — Other Ambulatory Visit: Payer: Self-pay

## 2018-12-19 ENCOUNTER — Encounter: Payer: Self-pay | Admitting: Obstetrics & Gynecology

## 2018-12-19 ENCOUNTER — Ambulatory Visit (INDEPENDENT_AMBULATORY_CARE_PROVIDER_SITE_OTHER): Payer: 59 | Admitting: Obstetrics & Gynecology

## 2018-12-19 VITALS — BP 107/73 | HR 72 | Wt 123.2 lb

## 2018-12-19 DIAGNOSIS — Z01419 Encounter for gynecological examination (general) (routine) without abnormal findings: Secondary | ICD-10-CM | POA: Diagnosis not present

## 2018-12-19 NOTE — Progress Notes (Signed)
Doing well 

## 2018-12-19 NOTE — Progress Notes (Addendum)
Subjective:    [name] is a 38 y.o. married P2 (35 and 40 yo kids) who presents for an annual exam. The patient has no complaints today. The patient is sexually active. GYN screening history: last pap: was normal. The patient wears seatbelts: yes. The patient participates in regular exercise: yes. (can't exercise due cervical disc disease)  Has the patient ever been transfused or tattooed?: no. The patient reports that there is not domestic violence in her life.   Menstrual History: OB History    Gravida  2   Para  2   Term  2   Preterm  0   AB  0   Living  2     SAB  0   TAB  0   Ectopic  0   Multiple  0   Live Births  2           Menarche age: 44 Patient's last menstrual period was 03/08/2013.    The following portions of the patient's history were reviewed and updated as appropriate: allergies, current medications, past family history, past medical history, past social history, past surgical history and problem list.  Review of Systems Pertinent items are noted in HPI.   FH- + breast cancer in paternal half aunt, no gyn/colon cancer Married since 2009, denies dyspareunia Works at [employer] gotten flu vaccine   Objective:    BP 107/73   Pulse 72   Wt 123 lb 3.2 oz (55.9 kg)   LMP 03/08/2013   BMI 22.17 kg/m   General Appearance:    Alert, cooperative, no distress, appears stated age  Head:    Normocephalic, without obvious abnormality, atraumatic  Eyes:    PERRL, conjunctiva/corneas clear, EOM's intact, fundi    benign, both eyes  Ears:    Normal TM's and external ear canals, both ears  Nose:   Nares normal, septum midline, mucosa normal, no drainage    or sinus tenderness  Throat:   Lips, mucosa, and tongue normal; teeth and gums normal  Neck:   Supple, symmetrical, trachea midline, no adenopathy;    thyroid:  no enlargement/tenderness/nodules; no carotid   bruit or JVD  Back:     Symmetric, no curvature, ROM normal, no CVA tenderness  Lungs:      Clear to auscultation bilaterally, respirations unlabored  Chest Wall:    No tenderness or deformity   Heart:    Regular rate and rhythm, S1 and S2 normal, no murmur, rub   or gallop  Breast Exam:    No tenderness, masses, or nipple abnormality  Abdomen:     Soft, non-tender, bowel sounds active all four quadrants,    no masses, no organomegaly  Genitalia:    Normal female without lesion, discharge or tenderness, good support of vaginal cuff, normal bimanual exam     Extremities:   Extremities normal, atraumatic, no cyanosis or edema  Pulses:   2+ and symmetric all extremities  Skin:   Skin color, texture, turgor normal, no rashes or lesions  Lymph nodes:   Cervical, supraclavicular, and axillary nodes normal  Neurologic:   CNII-XII intact, normal strength, sensation and reflexes    throughout  .    Assessment:    Healthy female exam.    Plan:     Continue healthly lifestyle choices

## 2018-12-23 DIAGNOSIS — M542 Cervicalgia: Secondary | ICD-10-CM | POA: Diagnosis not present

## 2018-12-28 DIAGNOSIS — M542 Cervicalgia: Secondary | ICD-10-CM | POA: Diagnosis not present

## 2019-01-03 DIAGNOSIS — Z6821 Body mass index (BMI) 21.0-21.9, adult: Secondary | ICD-10-CM | POA: Diagnosis not present

## 2019-01-03 DIAGNOSIS — Z20828 Contact with and (suspected) exposure to other viral communicable diseases: Secondary | ICD-10-CM | POA: Diagnosis not present

## 2019-01-05 DIAGNOSIS — M542 Cervicalgia: Secondary | ICD-10-CM | POA: Diagnosis not present

## 2019-01-11 DIAGNOSIS — M542 Cervicalgia: Secondary | ICD-10-CM | POA: Diagnosis not present

## 2019-01-25 ENCOUNTER — Encounter: Payer: Self-pay | Admitting: Family Medicine

## 2019-01-30 MED FILL — DOXYCYCLINE HYCLATE 100 MG: 100 | 14 days supply | Qty: 28 | Fill #0

## 2019-02-03 DIAGNOSIS — D2239 Melanocytic nevi of other parts of face: Secondary | ICD-10-CM | POA: Diagnosis not present

## 2019-02-03 DIAGNOSIS — L439 Lichen planus, unspecified: Secondary | ICD-10-CM | POA: Diagnosis not present

## 2019-02-03 DIAGNOSIS — D485 Neoplasm of uncertain behavior of skin: Secondary | ICD-10-CM | POA: Diagnosis not present

## 2019-02-03 DIAGNOSIS — D1801 Hemangioma of skin and subcutaneous tissue: Secondary | ICD-10-CM | POA: Diagnosis not present

## 2019-02-03 DIAGNOSIS — Z85828 Personal history of other malignant neoplasm of skin: Secondary | ICD-10-CM | POA: Diagnosis not present

## 2019-02-03 DIAGNOSIS — L710 Perioral dermatitis: Secondary | ICD-10-CM | POA: Diagnosis not present

## 2019-02-03 DIAGNOSIS — D225 Melanocytic nevi of trunk: Secondary | ICD-10-CM | POA: Diagnosis not present

## 2019-02-15 MED FILL — FLUTICASONE PROP 50 MCG SPR: 50 | 90 days supply | Qty: 48 | Fill #0

## 2019-03-14 ENCOUNTER — Encounter: Payer: Self-pay | Admitting: Family Medicine

## 2019-05-31 DIAGNOSIS — H5213 Myopia, bilateral: Secondary | ICD-10-CM | POA: Diagnosis not present

## 2019-06-16 DIAGNOSIS — L812 Freckles: Secondary | ICD-10-CM | POA: Diagnosis not present

## 2019-06-16 DIAGNOSIS — D229 Melanocytic nevi, unspecified: Secondary | ICD-10-CM | POA: Diagnosis not present

## 2019-06-16 DIAGNOSIS — L820 Inflamed seborrheic keratosis: Secondary | ICD-10-CM | POA: Diagnosis not present

## 2019-08-01 DIAGNOSIS — L570 Actinic keratosis: Secondary | ICD-10-CM | POA: Diagnosis not present

## 2019-08-01 DIAGNOSIS — L905 Scar conditions and fibrosis of skin: Secondary | ICD-10-CM | POA: Diagnosis not present

## 2019-08-01 DIAGNOSIS — D485 Neoplasm of uncertain behavior of skin: Secondary | ICD-10-CM | POA: Diagnosis not present

## 2019-08-01 DIAGNOSIS — Z85828 Personal history of other malignant neoplasm of skin: Secondary | ICD-10-CM | POA: Diagnosis not present

## 2019-08-01 DIAGNOSIS — D229 Melanocytic nevi, unspecified: Secondary | ICD-10-CM | POA: Diagnosis not present

## 2019-08-01 DIAGNOSIS — L812 Freckles: Secondary | ICD-10-CM | POA: Diagnosis not present

## 2019-09-20 MED FILL — AMOX-CLAV 875-125 MG TABLET: 875-125 | 5 days supply | Qty: 10 | Fill #0

## 2019-09-22 ENCOUNTER — Ambulatory Visit (HOSPITAL_COMMUNITY): Payer: Self-pay

## 2019-11-03 ENCOUNTER — Encounter: Payer: Self-pay | Admitting: Radiology

## 2020-01-02 ENCOUNTER — Ambulatory Visit: Payer: 59 | Admitting: Family Medicine

## 2020-01-05 ENCOUNTER — Telehealth: Payer: 59 | Admitting: Physician Assistant

## 2020-01-05 ENCOUNTER — Other Ambulatory Visit: Payer: Self-pay | Admitting: Physician Assistant

## 2020-01-05 DIAGNOSIS — R0981 Nasal congestion: Secondary | ICD-10-CM | POA: Diagnosis not present

## 2020-01-05 MED ORDER — AMOXICILLIN-POT CLAVULANATE 875-125 MG PO TABS: 1.0000 | ORAL_TABLET | Freq: Two times a day (BID) | ORAL | 0 refills | Status: DC

## 2020-01-05 MED ORDER — FLUTICASONE PROPIONATE 50 MCG/ACT NA SUSP: 2.0000 | Freq: Every day | NASAL | 0 refills | Status: DC

## 2020-01-05 MED ORDER — BENZONATATE 100 MG PO CAPS: 100.0000 mg | ORAL_CAPSULE | Freq: Three times a day (TID) | ORAL | 0 refills | Status: DC

## 2020-01-05 MED FILL — AMOX-CLAV 875-125 MG TABLET: 875-125 | 10 days supply | Qty: 20 | Fill #0

## 2020-01-05 MED FILL — BENZONATATE 100 MG CAPS: 100 | 5 days supply | Qty: 15 | Fill #0

## 2020-01-05 NOTE — Progress Notes (Signed)
We are sorry that you are not feeling well.  Here is how we plan to help!  Based on what you have shared with me it looks like you have sinusitis.  Sinusitis is inflammation and infection in the sinus cavities of the head.  Based on your presentation I believe you most likely have Acute Bacterial Sinusitis.  This is an infection caused by bacteria and is treated with antibiotics. I have prescribed Augmentin 875mg /125mg  one tablet twice daily with food, for 7 days. You may use an oral decongestant such as Mucinex D or if you have glaucoma or high blood pressure use plain Mucinex. Saline nasal spray help and can safely be used as often as needed for congestion.  If you develop worsening sinus pain, fever or notice severe headache and vision changes, or if symptoms are not better after completion of antibiotic, please schedule an appointment with a health care provider.    I have also prescribed Tessalon to help with the cough and Fluticasone nasal spray for your congestion.  Sinus infections are not as easily transmitted as other respiratory infection, however we still recommend that you avoid close contact with loved ones, especially the very young and elderly.  Remember to wash your hands thoroughly throughout the day as this is the number one way to prevent the spread of infection!  Home Care:  Only take medications as instructed by your medical team.  Complete the entire course of an antibiotic.  Do not take these medications with alcohol.  A steam or ultrasonic humidifier can help congestion.  You can place a towel over your head and breathe in the steam from hot water coming from a faucet.  Avoid close contacts especially the very young and the elderly.  Cover your mouth when you cough or sneeze.  Always remember to wash your hands.  Get Help Right Away If:  You develop worsening fever or sinus pain.  You develop a severe head ache or visual changes.  Your symptoms persist after you  have completed your treatment plan.  Make sure you  Understand these instructions.  Will watch your condition.  Will get help right away if you are not doing well or get worse.  Your e-visit answers were reviewed by a board certified advanced clinical practitioner to complete your personal care plan.  Depending on the condition, your plan could have included both over the counter or prescription medications.  If there is a problem please reply  once you have received a response from your provider.  Your safety is important to [place].  If you have drug allergies check your prescription carefully.    You can use MyChart to ask questions about today's visit, request a non-urgent call back, or ask for a work or school excuse for 24 hours related to this e-Visit. If it has been greater than 24 hours you will need to follow up with your provider, or enter a new e-Visit to address those concerns.  You will get an e-mail in the next two days asking about your experience.  I hope that your e-visit has been valuable and will speed your recovery. Thank you for using e-visits.  Approximately 5 minutes was spent documenting and reviewing patient's chart.

## 2020-01-06 ENCOUNTER — Telehealth: Payer: Self-pay | Admitting: Nurse Practitioner

## 2020-01-06 NOTE — Telephone Encounter (Signed)
I called [name] to discuss Covid symptoms and the use of Sotrovimab, a monoclonal antibody infusion for those with mild to moderate Covid symptoms and at a high risk of hospitalization.    Pt is not qualified for this infusion due to lack of identified risk factors and co-morbid conditions.  Symptoms reviewed as well as criteria for ending isolation.  Symptoms reviewed that would warrant ED/Hospital evaluation as well should her condition worsen. Preventative practices reviewed. Patient verbalized understanding.    Murray Hodgkins, NP

## 2020-01-08 DIAGNOSIS — Z20822 Contact with and (suspected) exposure to covid-19: Secondary | ICD-10-CM | POA: Diagnosis not present

## 2020-01-30 ENCOUNTER — Ambulatory Visit: Payer: 59 | Admitting: Family Medicine

## 2020-01-30 DIAGNOSIS — L812 Freckles: Secondary | ICD-10-CM | POA: Diagnosis not present

## 2020-01-30 DIAGNOSIS — D229 Melanocytic nevi, unspecified: Secondary | ICD-10-CM | POA: Diagnosis not present

## 2020-01-30 DIAGNOSIS — L905 Scar conditions and fibrosis of skin: Secondary | ICD-10-CM | POA: Diagnosis not present

## 2020-01-30 DIAGNOSIS — Z85828 Personal history of other malignant neoplasm of skin: Secondary | ICD-10-CM | POA: Diagnosis not present

## 2020-02-06 ENCOUNTER — Other Ambulatory Visit (HOSPITAL_COMMUNITY): Payer: Self-pay | Admitting: Family Medicine

## 2020-02-09 MED FILL — AZITHROMYCIN 500 MG TABLET: 500 | 3 days supply | Qty: 4 | Fill #0

## 2020-02-28 ENCOUNTER — Other Ambulatory Visit: Payer: Self-pay

## 2020-02-28 ENCOUNTER — Ambulatory Visit (INDEPENDENT_AMBULATORY_CARE_PROVIDER_SITE_OTHER): Payer: 59 | Admitting: Family Medicine

## 2020-02-28 ENCOUNTER — Encounter: Payer: Self-pay | Admitting: Family Medicine

## 2020-02-28 ENCOUNTER — Other Ambulatory Visit: Payer: Self-pay | Admitting: Family Medicine

## 2020-02-28 VITALS — BP 100/80 | HR 87 | Temp 98.3°F | Ht 62.0 in | Wt 120.2 lb

## 2020-02-28 DIAGNOSIS — Z8249 Family history of ischemic heart disease and other diseases of the circulatory system: Secondary | ICD-10-CM | POA: Diagnosis not present

## 2020-02-28 DIAGNOSIS — Z0000 Encounter for general adult medical examination without abnormal findings: Secondary | ICD-10-CM | POA: Diagnosis not present

## 2020-02-28 DIAGNOSIS — Z1159 Encounter for screening for other viral diseases: Secondary | ICD-10-CM | POA: Diagnosis not present

## 2020-02-28 DIAGNOSIS — J302 Other seasonal allergic rhinitis: Secondary | ICD-10-CM | POA: Diagnosis not present

## 2020-02-28 LAB — COMPREHENSIVE METABOLIC PANEL
ALT: 10 U/L (ref 0–35)
AST: 19 U/L (ref 0–37)
Albumin: 4.7 g/dL (ref 3.5–5.2)
Alkaline Phosphatase: 45 U/L (ref 39–117)
BUN: 21 mg/dL (ref 6–23)
CO2: 29 mEq/L (ref 19–32)
Calcium: 9.6 mg/dL (ref 8.4–10.5)
Chloride: 102 mEq/L (ref 96–112)
Creatinine, Ser: 0.76 mg/dL (ref 0.40–1.20)
GFR: 98.31 mL/min (ref >60.00)
Glucose, Bld: 84 mg/dL (ref 70–99)
Potassium: 3.9 mEq/L (ref 3.5–5.1)
Sodium: 137 mEq/L (ref 135–145)
Total Bilirubin: 0.5 mg/dL (ref 0.2–1.2)
Total Protein: 7.1 g/dL (ref 6.0–8.3)

## 2020-02-28 LAB — LIPID PANEL
Cholesterol: 183 mg/dL (ref 0–200)
HDL: 60.9 mg/dL (ref >39.00)
LDL Cholesterol: 116 mg/dL — ABNORMAL HIGH (ref 0–99)
NonHDL: 122.57
Total CHOL/HDL Ratio: 3
Triglycerides: 35 mg/dL (ref 0.0–149.0)
VLDL: 7 mg/dL (ref 0.0–40.0)

## 2020-02-28 LAB — TSH: TSH: 1.42 u[IU]/mL (ref 0.35–4.50)

## 2020-02-28 MED ORDER — FLUTICASONE PROPIONATE 50 MCG/ACT NA SUSP: 2.0000 | Freq: Every day | NASAL | 0 refills | Status: DC

## 2020-02-28 MED FILL — FLUTICASONE PROP 50 MCG SPR: 50 | 30 days supply | Qty: 16 | Fill #0

## 2020-02-28 NOTE — Progress Notes (Signed)
Annual Exam   Chief Complaint:  Chief Complaint  Patient presents with  . Establish Care    No concerns     History of Present Illness:  Ms. [name] is a 40 y.o. [id number] who LMP was Patient's last menstrual period was 03/08/2013., presents today for her annual examination.      Nutrition/Lifestyle Exercise: 6 days a weeks - weights and HIIT Diet: celiac - veggies, fruit, protein She does get adequate calcium and Vitamin D in her diet.  Social History   Tobacco Use  Smoking Status Never Smoker  Smokeless Tobacco Never Used   Social History   Substance and Sexual Activity  Alcohol Use Yes   Comment: 3 times a week   Social History   Substance and Sexual Activity  Drug Use No     Safety The patient wears seatbelts: yes.     The patient feels safe at home and in their relationships: yes.  General Health Dentist in the last year: Yes Eye doctor: yes  Menstrual S/p hysterectomy.  GYN S/p hysterectomy Sexually active   Cervical Cancer Screening (Age 37-65) S/p hysterectomy    Weight Wt Readings from Last 3 Encounters:  02/28/20 120 lb 4 oz (54.5 kg)  12/19/18 123 lb 3.2 oz (55.9 kg)  09/05/18 124 lb 6.4 oz (56.4 kg)   Patient has normal BMI  BMI Readings from Last 1 Encounters:  02/28/20 21.99 kg/m     Chronic disease screening Blood pressure monitoring:  BP Readings from Last 3 Encounters:  02/28/20 100/80  12/19/18 107/73  09/05/18 102/60     Lipid Monitoring: Indication for screening: age >45, obesity, diabetes, family hx, CV risk factors.  Lipid screening: Yes  Lab Results  Component Value Date   CHOL 156 09/05/2018   HDL 51.30 09/05/2018   LDLCALC 93 09/05/2018   TRIG 59.0 09/05/2018   CHOLHDL 3 09/05/2018     Diabetes Screening: age >26, overweight, family hx, PCOS, hx of gestational diabetes, at risk ethnicity, elevated blood pressure >135/80.  Diabetes Screening screening: Yes  Lab Results  Component Value Date    HGBA1C 5.3 01/14/2015      Past Medical History:  Diagnosis Date  . Basal cell carcinoma of nasolabial crease 2014  . Celiac disease   . Hyperlipidemia    DIET CONTROLLED  . PONV (postoperative nausea and vomiting)   . SVD (spontaneous vaginal delivery)    X 2  . Uterine prolapse 08/2009    Past Surgical History:  Procedure Laterality Date  . BILATERAL SALPINGECTOMY Bilateral 01/24/2014   Procedure: BILATERAL SALPINGECTOMY;  Surgeon: Emily Filbert, MD;  Location: [facility];  Service: Gynecology;  Laterality: Bilateral;  . CYSTOCELE REPAIR N/A 01/24/2014   Procedure: ANTERIOR REPAIR (CYSTOCELE);  Surgeon: Emily Filbert, MD;  Location: [facility];  Service: Gynecology;  Laterality: N/A;  . SKIN CANCER EXCISION     NOSE X 3  . TONSILLECTOMY AND ADENOIDECTOMY  1995  . VAGINAL HYSTERECTOMY N/A 01/24/2014   Procedure: HYSTERECTOMY VAGINAL;  Surgeon: Emily Filbert, MD;  Location: [facility];  Service: Gynecology;  Laterality: N/A;  . WISDOM TOOTH EXTRACTION      Prior to Admission medications   Medication Sig Start Date End Date Taking? Authorizing Provider  cetirizine (ZYRTEC) 10 MG tablet Take 1 tablet (10 mg total) by mouth daily. 05/23/18  Yes Lucille Passy, MD  esomeprazole (NEXIUM) 40 MG capsule Take 40 mg by mouth as needed.   Yes [provider]  fluticasone (FLONASE) 50 MCG/ACT nasal spray Place 2 sprays into both nostrils daily. 01/05/20  Yes Couture, Cortni S, PA-C  Multiple Vitamin (MULTIVITAMIN) tablet Take 1 tablet by mouth daily.   Yes [provider]    No Known Allergies  Gynecologic History: Patient's last menstrual period was 03/08/2013.  Obstetric History: [id number]  Social History   Socioeconomic History  . Marital status: Married    Spouse name: Fayrene Fearing  . Number of children: 2  . Years of education: [employer] school  . Highest education level: Not on file  Occupational History  . Not on file  Tobacco Use  . Smoking status: Never Smoker  . Smokeless  tobacco: Never Used  Vaping Use  . Vaping Use: Never used  Substance and Sexual Activity  . Alcohol use: Yes    Comment: 3 times a week  . Drug use: No  . Sexual activity: Yes    Partners: Male    Birth control/protection: Surgical  Other Topics Concern  . 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      Family: Adalyn (2015) and [name] (2011)      Enjoys: spend time with children, travel soccer games w/ children      Exercise: 6 days a weeks - weights and HIIT   Diet: celiac - veggies, fruit, protein      Safety   Seat belts: Yes    Guns: Yes  and secure   Safe in relationships: Yes    Social Determinants of Health   Financial Resource Strain: Not on file  Food Insecurity: Not on file  Transportation Needs: Not on file  Physical Activity: Not on file  Stress: Not on file  Social Connections: Not on file  Intimate Partner Violence: Not on file    Family History  Problem Relation Age of Onset  . Hypothyroidism Mother   . Hyperlipidemia Father   . Celiac disease Sister   . Hashimoto's thyroiditis Sister   . Heart disease Paternal Grandfather 30       multiple CABG  . Hyperlipidemia Paternal Grandfather   . Cancer Paternal Grandmother        pancreatic cancer    Review of Systems  Constitutional: Negative for chills and fever.  HENT: Negative for congestion and sore throat.   Eyes: Negative for blurred vision and double vision.  Respiratory: Negative for shortness of breath.   Cardiovascular: Negative for chest pain.  Gastrointestinal: Negative for heartburn, nausea and vomiting.  Genitourinary: Negative.   Musculoskeletal: Negative.  Negative for myalgias.  Skin: Negative for rash.  Neurological: Negative for dizziness and headaches.  Endo/Heme/Allergies: Does not bruise/bleed easily.  Psychiatric/Behavioral: Negative for  depression. The patient is not nervous/anxious.      Physical Exam BP 100/80   Pulse 87   Temp 98.3 F (36.8 C) (Temporal)   Ht 5\' 2"  (1.575 m)   Wt 120 lb 4 oz (54.5 kg)   LMP 03/08/2013   SpO2 98%   BMI 21.99 kg/m    BP Readings from Last 3 Encounters:  02/28/20 100/80  12/19/18 107/73  09/05/18 102/60    Wt Readings from Last 3 Encounters:  02/28/20 120 lb 4 oz (54.5 kg)  12/19/18 123 lb 3.2 oz (55.9 kg)  09/05/18 124 lb 6.4 oz (56.4 kg)     Physical  Exam Constitutional:      General: She is not in acute distress.    Appearance: She is well-developed and well-nourished. She is not diaphoretic.  HENT:     Head: Normocephalic and atraumatic.     Right Ear: External ear normal.     Left Ear: External ear normal.     Nose: Nose normal.     Mouth/Throat:     Mouth: Oropharynx is clear and moist.  Eyes:     General: No scleral icterus.    Extraocular Movements: EOM normal.     Conjunctiva/sclera: Conjunctivae normal.  Cardiovascular:     Rate and Rhythm: Normal rate and regular rhythm.     Heart sounds: No murmur heard.   Pulmonary:     Effort: Pulmonary effort is normal. No respiratory distress.     Breath sounds: Normal breath sounds. No wheezing.  Abdominal:     General: Bowel sounds are normal. There is no distension.     Palpations: Abdomen is soft. There is no mass.     Tenderness: There is no abdominal tenderness. There is no guarding or rebound.  Musculoskeletal:        General: No edema. Normal range of motion.     Cervical back: Neck supple.  Lymphadenopathy:     Cervical: No cervical adenopathy.  Skin:    General: Skin is warm and dry.     Capillary Refill: Capillary refill takes less than 2 seconds.  Neurological:     Mental Status: She is alert and oriented to person, place, and time.     Deep Tendon Reflexes: Strength normal. Reflexes normal.  Psychiatric:        Mood and Affect: Mood and affect normal.        Behavior: Behavior normal.        Results: Depression screen [facility] 2/9 02/28/2020 04/05/2018 06/14/2017  Decreased Interest 0 0 0  Down, Depressed, Hopeless 1 0 0  PHQ - 2 Score 1 0 0  Altered sleeping 0 - -  Tired, decreased energy 1 - -  Change in appetite 0 - -  Feeling bad or failure about yourself  0 - -  Trouble concentrating 0 - -  Moving slowly or fidgety/restless 0 - -  Suicidal thoughts 0 - -  PHQ-9 Score 2 - -  Difficult doing work/chores Not difficult at all - -      Assessment: 40 y.o. [id number] female here for routine annual examination.  Plan: Problem List Items Addressed This Visit      Other   Seasonal allergies   Relevant Medications   fluticasone (FLONASE) 50 MCG/ACT nasal spray    Other Visit Diagnoses    Annual physical exam    -  Primary   Relevant Orders   TSH   Need for hepatitis C screening test       Relevant Orders   Hepatitis C antibody   Family history of heart attack       Relevant Orders   Comprehensive metabolic panel   Lipid panel       Screening: -- Blood pressure screen normal -- cholesterol screening: will obtain -- Weight screening: normal -- Diabetes Screening: will obtain -- Nutrition: encouraged healthy diet   Psych -- Depression screening (PHQ-9):  Flowsheet Row Office Visit from 02/28/2020 in [facility] [place]  PHQ-9 Total Score 2      Depression screen [facility] 2/9 02/28/2020 04/05/2018 06/14/2017  Decreased Interest 0 0 0  Down,  Depressed, Hopeless 1 0 0  PHQ - 2 Score 1 0 0  Altered sleeping 0 - -  Tired, decreased energy 1 - -  Change in appetite 0 - -  Feeling bad or failure about yourself  0 - -  Trouble concentrating 0 - -  Moving slowly or fidgety/restless 0 - -  Suicidal thoughts 0 - -  PHQ-9 Score 2 - -  Difficult doing work/chores Not difficult at all - -      Safety -- tobacco screening: not using -- alcohol screening:  low-risk usage. -- no evidence of domestic violence or intimate partner  violence.   Cancer Screening -- pap smear not collected per ASCCP guidelines -- family history of breast cancer screening: not at high risk. Discussed shared decision making for age 41-49. She will also discuss with her OB/GYN and get their opinion regarding screening.    Immunizations Immunization History  Administered Date(s) Administered  . Influenza Whole 10/24/2012  . Influenza,inj,Quad PF,6+ Mos 11/17/2019  . Influenza-Unspecified 11/24/2017  . PFIZER SARS-COV-2 Vaccination 02/13/2019, 03/04/2019, 12/01/2019  . Tdap 09/15/2013    -- flu vaccine up to date -- TDAP q10 years up to date -- Covid-19 Vaccine up to date  Encouraged regular vision and dental screening. Encouraged healthy exercise and diet.   Lynnda Child

## 2020-02-28 NOTE — Patient Instructions (Signed)
Preventive Care 40-39 Years Old, Female Preventive care refers to visits with your health care provider and lifestyle choices that can promote health and wellness. This includes:  A yearly physical exam. This may also be called an annual well check.  Regular dental visits and eye exams.  Immunizations.  Screening for certain conditions.  Healthy lifestyle choices, such as eating a healthy diet, getting regular exercise, not using drugs or products that contain nicotine and tobacco, and limiting alcohol use. What can I expect for my preventive care visit? Physical exam Your health care provider will check your:  Height and weight. This may be used to calculate body mass index (BMI), which tells if you are at a healthy weight.  Heart rate and blood pressure.  Skin for abnormal spots. Counseling Your health care provider may ask you questions about your:  Alcohol, tobacco, and drug use.  Emotional well-being.  Home and relationship well-being.  Sexual activity.  Eating habits.  Work and work environment.  Method of birth control.  Menstrual cycle.  Pregnancy history. What immunizations do I need?  Influenza (flu) vaccine  This is recommended every year. Tetanus, diphtheria, and pertussis (Tdap) vaccine  You may need a Td booster every 10 years. Varicella (chickenpox) vaccine  You may need this if you have not been vaccinated. Human papillomavirus (HPV) vaccine  If recommended by your health care provider, you may need three doses over 6 months. Measles, mumps, and rubella (MMR) vaccine  You may need at least one dose of MMR. You may also need a second dose. Meningococcal conjugate (MenACWY) vaccine  One dose is recommended if you are age 19-21 years and a first-year college student living in a residence hall, or if you have one of several medical conditions. You may also need additional booster doses. Pneumococcal conjugate (PCV13) vaccine  You may need  this if you have certain conditions and were not previously vaccinated. Pneumococcal polysaccharide (PPSV23) vaccine  You may need one or two doses if you smoke cigarettes or if you have certain conditions. Hepatitis A vaccine  You may need this if you have certain conditions or if you travel or work in places where you may be exposed to hepatitis A. Hepatitis B vaccine  You may need this if you have certain conditions or if you travel or work in places where you may be exposed to hepatitis B. Haemophilus influenzae type b (Hib) vaccine  You may need this if you have certain conditions. You may receive vaccines as individual doses or as more than one vaccine together in one shot (combination vaccines). Talk with your health care provider about the risks and benefits of combination vaccines. What tests do I need?  Blood tests  Lipid and cholesterol levels. These may be checked every 5 years starting at age 20.  Hepatitis C test.  Hepatitis B test. Screening  Diabetes screening. This is done by checking your blood sugar (glucose) after you have not eaten for a while (fasting).  Sexually transmitted disease (STD) testing.  BRCA-related cancer screening. This may be done if you have a family history of breast, ovarian, tubal, or peritoneal cancers.  Pelvic exam and Pap test. This may be done every 3 years starting at age 21. Starting at age 30, this may be done every 5 years if you have a Pap test in combination with an HPV test. Talk with your health care provider about your test results, treatment options, and if necessary, the need for more tests.   Follow these instructions at home: Eating and drinking   Eat a diet that includes fresh fruits and vegetables, whole grains, lean protein, and low-fat dairy.  Take vitamin and mineral supplements as recommended by your health care provider.  Do not drink alcohol if: ? Your health care provider tells you not to drink. ? You are  pregnant, may be pregnant, or are planning to become pregnant.  If you drink alcohol: ? Limit how much you have to 0-1 drink a day. ? Be aware of how much alcohol is in your drink. In the [place] one drink equals one 12 oz bottle of beer (355 mL), one 5 oz glass of wine (148 mL), or one 1 oz glass of hard liquor (44 mL). Lifestyle  Take daily care of your teeth and gums.  Stay active. Exercise for at least 30 minutes on 5 or more days each week.  Do not use any products that contain nicotine or tobacco, such as cigarettes, e-cigarettes, and chewing tobacco. If you need help quitting, ask your health care provider.  If you are sexually active, practice safe sex. Use a condom or other form of birth control (contraception) in order to prevent pregnancy and STIs (sexually transmitted infections). If you plan to become pregnant, see your health care provider for a preconception visit. What's next?  Visit your health care provider once a year for a well check visit.  Ask your health care provider how often you should have your eyes and teeth checked.  Stay up to date on all vaccines. This information is not intended to replace advice given to you by your health care provider. Make sure you discuss any questions you have with your health care provider. Document Revised: 10/21/2017 Document Reviewed: 10/21/2017 Elsevier Patient Education  2020 [employer].

## 2020-02-29 DIAGNOSIS — Z1159 Encounter for screening for other viral diseases: Secondary | ICD-10-CM | POA: Diagnosis not present

## 2020-02-29 LAB — HEPATITIS C ANTIBODY
Hepatitis C Ab: NONREACTIVE
SIGNAL TO CUT-OFF: 0.17 (ref <1.00)

## 2020-03-14 ENCOUNTER — Ambulatory Visit: Payer: 59 | Admitting: Family Medicine

## 2020-03-18 ENCOUNTER — Encounter: Payer: Self-pay | Admitting: Family Medicine

## 2020-04-08 IMAGING — DX DG CERVICAL SPINE COMPLETE 4+V
5 series · 5 of 5 positions shown · non-contrast
Comparison: None in PACs

CLINICAL DATA: Chronic neck pain, radiculopathy.

EXAM:
CERVICAL SPINE - COMPLETE 4+ VIEW

[cervical spine ap]
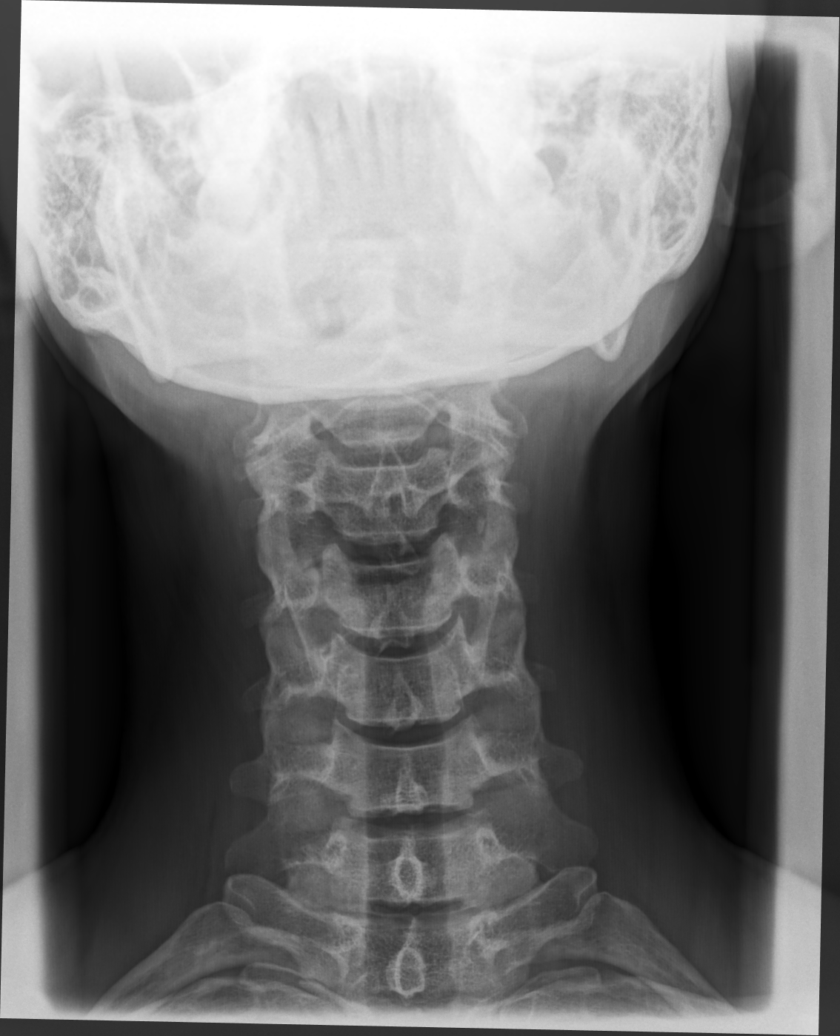

[cervical spine oblique (1 of 2)]
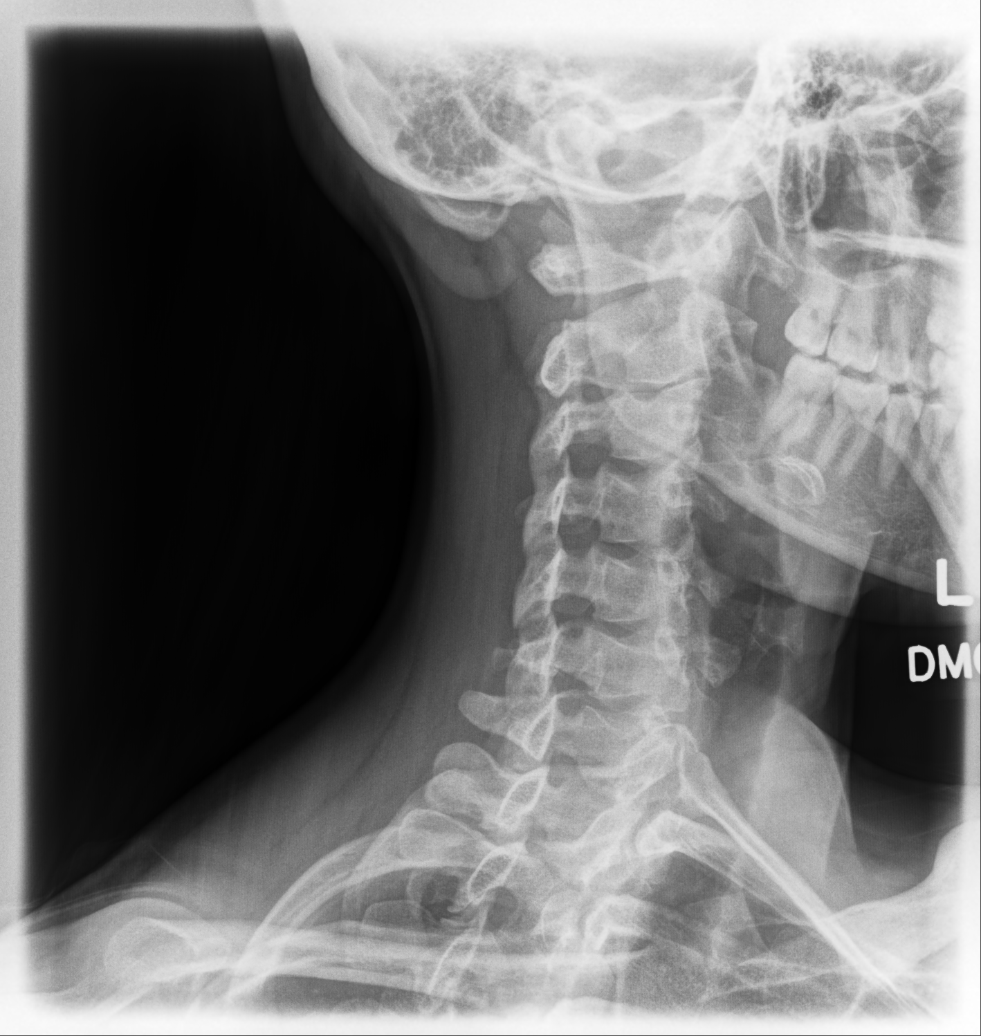

[cervical spine oblique (2 of 2)]
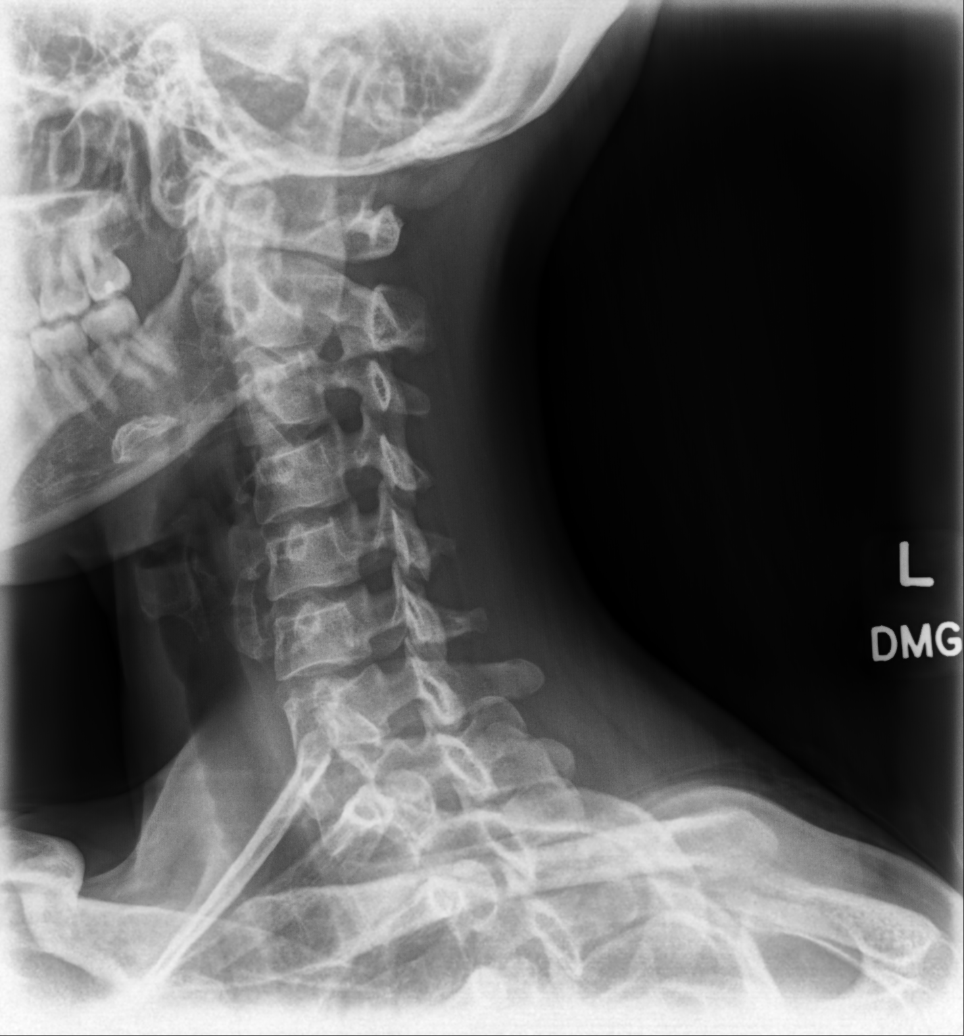

[cervical spine lat]
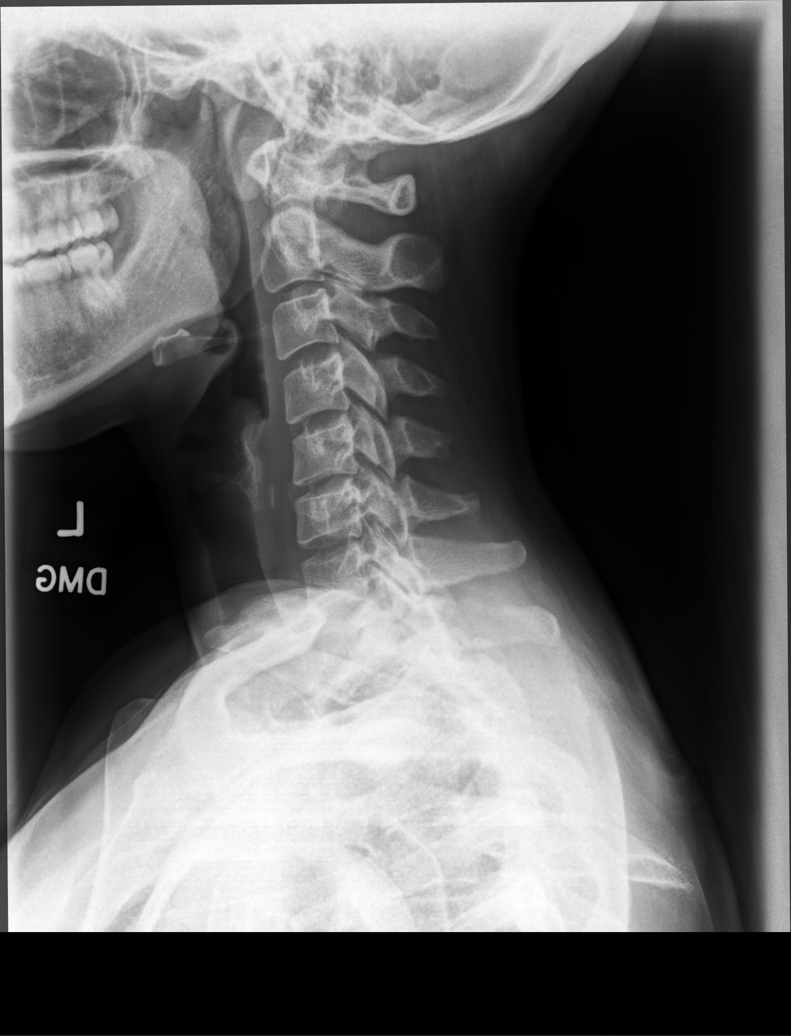

[cervical spine open mouth ap]
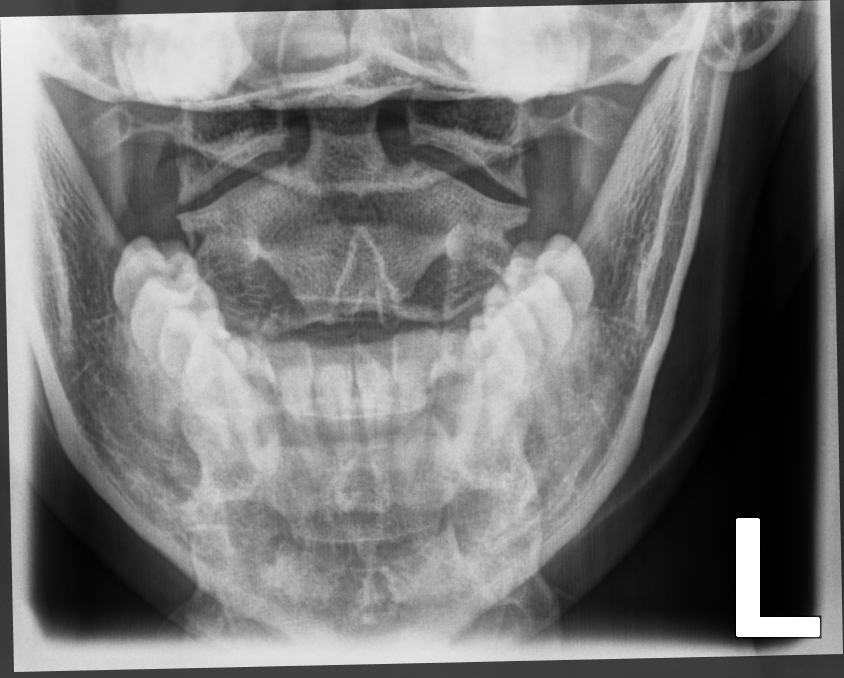

[5 of 5 positions shown; findings below may reference images not displayed]

FINDINGS: There is loss of the normal cervical lordosis. The vertebral bodies
are preserved in height. The disc space heights are well maintained.
The prevertebral soft tissue spaces are normal. There is no perched
facet. The spinous processes are intact. The oblique views reveal no
bony encroachment upon the neural foramina. The odontoid is intact.
IMPRESSION: Mild loss of the normal cervical lordosis compatible with muscle
spasm. No acute or significant chronic bony abnormality.

## 2020-04-10 ENCOUNTER — Encounter: Payer: Self-pay | Admitting: Family Medicine

## 2020-04-10 ENCOUNTER — Other Ambulatory Visit: Payer: Self-pay | Admitting: Family Medicine

## 2020-04-10 DIAGNOSIS — Z1231 Encounter for screening mammogram for malignant neoplasm of breast: Secondary | ICD-10-CM

## 2020-04-22 ENCOUNTER — Inpatient Hospital Stay: Admission: RE | Admit: 2020-04-22 | Payer: 59 | Source: Ambulatory Visit

## 2020-04-22 ENCOUNTER — Ambulatory Visit
Admission: RE | Admit: 2020-04-22 | Discharge: 2020-04-22 | Disposition: A | Discharge status: Home / Self Care | Payer: 59 | Source: Ambulatory Visit | Attending: Family Medicine | Admitting: Family Medicine

## 2020-04-22 ENCOUNTER — Other Ambulatory Visit: Payer: Self-pay

## 2020-04-22 DIAGNOSIS — Z1231 Encounter for screening mammogram for malignant neoplasm of breast: Secondary | ICD-10-CM

## 2020-05-14 ENCOUNTER — Ambulatory Visit: Payer: 59 | Admitting: Obstetrics & Gynecology

## 2020-05-14 DIAGNOSIS — M542 Cervicalgia: Secondary | ICD-10-CM | POA: Diagnosis not present

## 2020-05-27 DIAGNOSIS — M542 Cervicalgia: Secondary | ICD-10-CM | POA: Diagnosis not present

## 2020-06-03 ENCOUNTER — Other Ambulatory Visit: Payer: Self-pay | Admitting: *Deleted

## 2020-06-03 ENCOUNTER — Other Ambulatory Visit: Payer: Self-pay

## 2020-06-03 ENCOUNTER — Ambulatory Visit (HOSPITAL_COMMUNITY)
Admission: RE | Admit: 2020-06-03 | Discharge: 2020-06-03 | Disposition: A | Discharge status: Home / Self Care | Payer: 59 | Source: Ambulatory Visit | Attending: Thoracic Surgery (Cardiothoracic Vascular Surgery) | Admitting: Thoracic Surgery (Cardiothoracic Vascular Surgery)

## 2020-06-03 DIAGNOSIS — M79605 Pain in left leg: Secondary | ICD-10-CM

## 2020-06-20 ENCOUNTER — Other Ambulatory Visit: Payer: Self-pay

## 2020-06-20 ENCOUNTER — Ambulatory Visit (INDEPENDENT_AMBULATORY_CARE_PROVIDER_SITE_OTHER): Payer: 59 | Admitting: Family Medicine

## 2020-06-20 ENCOUNTER — Encounter: Payer: Self-pay | Admitting: Family Medicine

## 2020-06-20 VITALS — BP 100/65 | HR 66 | Ht 62.0 in | Wt 123.6 lb

## 2020-06-20 DIAGNOSIS — Z01419 Encounter for gynecological examination (general) (routine) without abnormal findings: Secondary | ICD-10-CM

## 2020-06-20 NOTE — Patient Instructions (Signed)
Preventive Care 11-40 Years Old, Female Preventive care refers to lifestyle choices and visits with your health care provider that can promote health and wellness. This includes:  A yearly physical exam. This is also called an annual wellness visit.  Regular dental and eye exams.  Immunizations.  Screening for certain conditions.  Healthy lifestyle choices, such as: ? Eating a healthy diet. ? Getting regular exercise. ? Not using drugs or products that contain nicotine and tobacco. ? Limiting alcohol use. What can I expect for my preventive care visit? Physical exam Your health care provider may check your:  Height and weight. These may be used to calculate your BMI (body mass index). BMI is a measurement that tells if you are at a healthy weight.  Heart rate and blood pressure.  Body temperature.  Skin for abnormal spots. Counseling Your health care provider may ask you questions about your:  Past medical problems.  Family's medical history.  Alcohol, tobacco, and drug use.  Emotional well-being.  Home life and relationship well-being.  Sexual activity.  Diet, exercise, and sleep habits.  Work and work Statistician.  Access to firearms.  Method of birth control.  Menstrual cycle.  Pregnancy history. What immunizations do I need? Vaccines are usually given at various ages, according to a schedule. Your health care provider will recommend vaccines for you based on your age, medical history, and lifestyle or other factors, such as travel or where you work.   What tests do I need? Blood tests  Lipid and cholesterol levels. These may be checked every 5 years starting at age 64.  Hepatitis C test.  Hepatitis B test. Screening  Diabetes screening. This is done by checking your blood sugar (glucose) after you have not eaten for a while (fasting).  STD (sexually transmitted disease) testing, if you are at risk.  BRCA-related cancer screening. This may  be done if you have a family history of breast, ovarian, tubal, or peritoneal cancers.  Pelvic exam and Pap test. This may be done every 3 years starting at age 61. Starting at age 82, this may be done every 5 years if you have a Pap test in combination with an HPV test. Talk with your health care provider about your test results, treatment options, and if necessary, the need for more tests.   Follow these instructions at home: Eating and drinking  Eat a healthy diet that includes fresh fruits and vegetables, whole grains, lean protein, and low-fat dairy products.  Take vitamin and mineral supplements as recommended by your health care provider.  Do not drink alcohol if: ? Your health care provider tells you not to drink. ? You are pregnant, may be pregnant, or are planning to become pregnant.  If you drink alcohol: ? Limit how much you have to 0-1 drink a day. ? Be aware of how much alcohol is in your drink. In the [place] one drink equals one 12 oz bottle of beer (355 mL), one 5 oz glass of wine (148 mL), or one 1 oz glass of hard liquor (44 mL).   Lifestyle  Take daily care of your teeth and gums. Brush your teeth every morning and night with fluoride toothpaste. Floss one time each day.  Stay active. Exercise for at least 30 minutes 5 or more days each week.  Do not use any products that contain nicotine or tobacco, such as cigarettes, e-cigarettes, and chewing tobacco. If you need help quitting, ask your health care provider.  Do  not use drugs.  If you are sexually active, practice safe sex. Use a condom or other form of protection to prevent STIs (sexually transmitted infections).  If you do not wish to become pregnant, use a form of birth control. If you plan to become pregnant, see your health care provider for a prepregnancy visit.  Find healthy ways to cope with stress, such as: ? Meditation, yoga, or listening to music. ? Journaling. ? Talking to a trusted  person. ? Spending time with friends and family. Safety  Always wear your seat belt while driving or riding in a vehicle.  Do not drive: ? If you have been drinking alcohol. Do not ride with someone who has been drinking. ? When you are tired or distracted. ? While texting.  Wear a helmet and other protective equipment during sports activities.  If you have firearms in your house, make sure you follow all gun safety procedures.  Seek help if you have been physically or sexually abused. What's next?  Go to your health care provider once a year for an annual wellness visit.  Ask your health care provider how often you should have your eyes and teeth checked.  Stay up to date on all vaccines. This information is not intended to replace advice given to you by your health care provider. Make sure you discuss any questions you have with your health care provider. Document Revised: 10/08/2019 Document Reviewed: 10/21/2017 Elsevier Patient Education  2021 [employer].

## 2020-06-20 NOTE — Progress Notes (Signed)
  Subjective:     [name] is a 40 y.o. female and is here for a comprehensive physical exam. The patient reports no problems. She is s/p hyst for prolapse.  The following portions of the patient's history were reviewed and updated as appropriate: allergies, current medications, past family history, past medical history, past social history, past surgical history and problem list.  Review of Systems Pertinent items noted in HPI and remainder of comprehensive ROS otherwise negative.   Objective:    BP 100/65   Pulse 66   Ht 5\' 2"  (1.575 m)   Wt 123 lb 9.6 oz (56.1 kg)   LMP 03/08/2013   BMI 22.61 kg/m  General appearance: alert, cooperative and appears stated age Head: Normocephalic, without obvious abnormality, atraumatic Neck: no adenopathy, supple, symmetrical, trachea midline and thyroid not enlarged, symmetric, no tenderness/mass/nodules Lungs: clear to auscultation bilaterally Breasts: normal appearance, no masses or tenderness Heart: regular rate and rhythm, S1, S2 normal, no murmur, click, rub or gallop Abdomen: soft, non-tender; bowel sounds normal; no masses,  no organomegaly Extremities: extremities normal, atraumatic, no cyanosis or edema Pulses: 2+ and symmetric Skin: Skin color, texture, turgor normal. No rashes or lesions Lymph nodes: Cervical, supraclavicular, and axillary nodes normal. Neurologic: Grossly normal    Assessment:    Healthy female exam.      Plan:  Encounter for gynecological examination without abnormal finding - s/p hyst for benign reasons, no pap needed. Mammogram UTD     See After Visit Summary for Counseling Recommendations

## 2020-07-18 DIAGNOSIS — H5213 Myopia, bilateral: Secondary | ICD-10-CM | POA: Diagnosis not present

## 2020-07-30 DIAGNOSIS — L570 Actinic keratosis: Secondary | ICD-10-CM | POA: Diagnosis not present

## 2020-07-30 DIAGNOSIS — L989 Disorder of the skin and subcutaneous tissue, unspecified: Secondary | ICD-10-CM | POA: Diagnosis not present

## 2020-07-30 DIAGNOSIS — L723 Sebaceous cyst: Secondary | ICD-10-CM | POA: Diagnosis not present

## 2020-07-30 DIAGNOSIS — D485 Neoplasm of uncertain behavior of skin: Secondary | ICD-10-CM | POA: Diagnosis not present

## 2020-07-30 DIAGNOSIS — Z85828 Personal history of other malignant neoplasm of skin: Secondary | ICD-10-CM | POA: Diagnosis not present

## 2020-07-30 DIAGNOSIS — L905 Scar conditions and fibrosis of skin: Secondary | ICD-10-CM | POA: Diagnosis not present

## 2020-09-26 ENCOUNTER — Other Ambulatory Visit: Payer: Self-pay

## 2020-09-26 ENCOUNTER — Ambulatory Visit (INDEPENDENT_AMBULATORY_CARE_PROVIDER_SITE_OTHER): Payer: 59

## 2020-09-26 ENCOUNTER — Other Ambulatory Visit (HOSPITAL_COMMUNITY): Payer: Self-pay

## 2020-09-26 ENCOUNTER — Ambulatory Visit (INDEPENDENT_AMBULATORY_CARE_PROVIDER_SITE_OTHER): Payer: 59 | Admitting: Podiatry

## 2020-09-26 DIAGNOSIS — M722 Plantar fascial fibromatosis: Secondary | ICD-10-CM

## 2020-09-26 DIAGNOSIS — M778 Other enthesopathies, not elsewhere classified: Secondary | ICD-10-CM

## 2020-09-26 MED ORDER — MELOXICAM 15 MG PO TABS
15.0000 mg | ORAL_TABLET | Freq: Every day | ORAL | 3 refills | Status: DC
  Filled 2020-09-26: qty 30, 30d supply, fill #0

## 2020-09-26 MED ORDER — TRIAMCINOLONE ACETONIDE 40 MG/ML IJ SUSP
20.0000 mg | Freq: Once | INTRAMUSCULAR | Status: AC
  Administered 2020-09-26: 20 mg

## 2020-09-26 MED ORDER — METHYLPREDNISOLONE 4 MG PO TBPK
ORAL_TABLET | ORAL | 0 refills | Status: DC
  Filled 2020-09-26: qty 21, 6d supply, fill #0

## 2020-09-27 ENCOUNTER — Other Ambulatory Visit (HOSPITAL_COMMUNITY): Payer: Self-pay

## 2020-09-29 NOTE — Progress Notes (Signed)
Subjective:  Patient ID: [name], female    DOB: 03/03/1980,  MRN: [id number] HPI Chief Complaint  Patient presents with   Foot Pain    left heel pain    40 y.o. female presents with the above complaint.   ROS: Denies fever chills nausea vomiting muscle aches pains calf pain back pain chest pain shortness of breath.  Past Medical History:  Diagnosis Date   Basal cell carcinoma of nasolabial crease 2014   Celiac disease    Hyperlipidemia    DIET CONTROLLED   PONV (postoperative nausea and vomiting)    SVD (spontaneous vaginal delivery)    X 2   Uterine prolapse 08/2009   Past Surgical History:  Procedure Laterality Date   BILATERAL SALPINGECTOMY Bilateral 01/24/2014   Procedure: BILATERAL SALPINGECTOMY;  Surgeon: Emily Filbert, MD;  Location: [facility];  Service: Gynecology;  Laterality: Bilateral;   CYSTOCELE REPAIR N/A 01/24/2014   Procedure: ANTERIOR REPAIR (CYSTOCELE);  Surgeon: Emily Filbert, MD;  Location: [facility];  Service: Gynecology;  Laterality: N/A;   SKIN CANCER EXCISION     NOSE X 3   TONSILLECTOMY AND ADENOIDECTOMY  1995   VAGINAL HYSTERECTOMY N/A 01/24/2014   Procedure: HYSTERECTOMY VAGINAL;  Surgeon: Emily Filbert, MD;  Location: [facility];  Service: Gynecology;  Laterality: N/A;   WISDOM TOOTH EXTRACTION      Current Outpatient Medications:    meloxicam (MOBIC) 15 MG tablet, Take 1 tablet (15 mg total) by mouth daily., Disp: 30 tablet, Rfl: 3   methylPREDNISolone (MEDROL DOSEPAK) 4 MG TBPK tablet, Take as directed, Disp: 21 tablet, Rfl: 0   azithromycin (ZITHROMAX) 500 MG tablet, NONBLODDY DIARHEA:TAKE 2 TABS DAY 1 THEN STOP IF RESOLVED OR IF PERSISTS TAKE 1 TAB DAYS 2 & 3. BLOODY DIARHEA:TAKE 2 TABS DAY 1 & TAKE 1 TAB DAYS 2 & 3. (Patient not taking: Reported on 06/20/2020), Disp: 4 tablet, Rfl: 0   benzonatate (TESSALON) 100 MG capsule, TAKE 1 CAPSULE (100 MG TOTAL) BY MOUTH EVERY 8 (EIGHT) HOURS FOR 5 DAYS. (Patient not taking: Reported on 06/20/2020), Disp: 15  capsule, Rfl: 0   cetirizine (ZYRTEC) 10 MG tablet, Take 1 tablet (10 mg total) by mouth daily., Disp: 90 tablet, Rfl: 3   esomeprazole (NEXIUM) 40 MG capsule, Take 40 mg by mouth as needed. (Patient not taking: Reported on 06/20/2020), Disp: , Rfl:    fluticasone (FLONASE) 50 MCG/ACT nasal spray, PLACE 2 SPRAYS INTO BOTH NOSTRILS DAILY., Disp: 16 g, Rfl: 0   Multiple Vitamin (MULTIVITAMIN) tablet, Take 1 tablet by mouth daily. (Patient not taking: Reported on 06/20/2020), Disp: , Rfl:   No Known Allergies Review of Systems Objective:  There were no vitals filed for this visit.  General: Well developed, nourished, in no acute distress, alert and oriented x3   Dermatological: Skin is warm, dry and supple bilateral. Nails x 10 are well maintained; remaining integument appears unremarkable at this time. There are no open sores, no preulcerative lesions, no rash or signs of infection present.  Vascular: Dorsalis Pedis artery and Posterior Tibial artery pedal pulses are 2/4 bilateral with immedate capillary fill time. Pedal hair growth present. No varicosities and no lower extremity edema present bilateral.   Neruologic: Grossly intact via light touch bilateral. Vibratory intact via tuning fork bilateral. Protective threshold with Semmes Wienstein monofilament intact to all pedal sites bilateral. Patellar and Achilles deep tendon reflexes 2+ bilateral. No Babinski or clonus noted bilateral.   Musculoskeletal: No gross boney pedal  deformities bilateral. No pain, crepitus, or limitation noted with foot and ankle range of motion bilateral. Muscular strength 5/5 in all groups tested bilateral.  Pain on palpation medial calcaneal tubercle of the left heel.  Gait: Unassisted, Nonantalgic.    Radiographs:  Radiographs only demonstrate soft tissue increase in density plantar fascial tendon insertion site no acute findings are noted.  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  Plan: Discussed etiology pathology conservative versus surgical therapies.  Started her on methylprednisolone to be followed by meloxicam.  Injected the heel 20 mg Kenalog 5 mg Marcaine point of maximal tenderness.  I will follow-up with her in 1 month may need to consider orthotics     Megin Consalvo T. [place], [place]

## 2020-10-01 ENCOUNTER — Other Ambulatory Visit: Payer: Self-pay | Admitting: Podiatry

## 2020-10-01 DIAGNOSIS — M722 Plantar fascial fibromatosis: Secondary | ICD-10-CM

## 2020-11-05 ENCOUNTER — Ambulatory Visit: Payer: 59 | Admitting: Podiatry

## 2021-03-06 ENCOUNTER — Telehealth: Payer: 59 | Admitting: Physician Assistant

## 2021-03-06 ENCOUNTER — Other Ambulatory Visit (HOSPITAL_COMMUNITY): Payer: Self-pay

## 2021-03-06 ENCOUNTER — Other Ambulatory Visit (HOSPITAL_BASED_OUTPATIENT_CLINIC_OR_DEPARTMENT_OTHER): Payer: Self-pay

## 2021-03-06 DIAGNOSIS — H109 Unspecified conjunctivitis: Secondary | ICD-10-CM

## 2021-03-06 DIAGNOSIS — R229 Localized swelling, mass and lump, unspecified: Secondary | ICD-10-CM | POA: Diagnosis not present

## 2021-03-06 DIAGNOSIS — Z08 Encounter for follow-up examination after completed treatment for malignant neoplasm: Secondary | ICD-10-CM | POA: Diagnosis not present

## 2021-03-06 DIAGNOSIS — L812 Freckles: Secondary | ICD-10-CM | POA: Diagnosis not present

## 2021-03-06 DIAGNOSIS — D229 Melanocytic nevi, unspecified: Secondary | ICD-10-CM | POA: Diagnosis not present

## 2021-03-06 DIAGNOSIS — Z85828 Personal history of other malignant neoplasm of skin: Secondary | ICD-10-CM | POA: Diagnosis not present

## 2021-03-06 MED ORDER — POLYMYXIN B-TRIMETHOPRIM 10000-0.1 UNIT/ML-% OP SOLN
1.0000 [drp] | OPHTHALMIC | 0 refills | Status: DC
  Filled 2021-03-06: qty 10, 17d supply, fill #0
  Filled 2021-03-06: qty 10, 34d supply, fill #0

## 2021-03-06 NOTE — Progress Notes (Signed)

## 2021-03-26 ENCOUNTER — Other Ambulatory Visit (HOSPITAL_COMMUNITY): Payer: Self-pay

## 2021-03-26 ENCOUNTER — Other Ambulatory Visit: Payer: Self-pay | Admitting: Family Medicine

## 2021-03-26 DIAGNOSIS — J302 Other seasonal allergic rhinitis: Secondary | ICD-10-CM

## 2021-03-26 MED ORDER — FLUTICASONE PROPIONATE 50 MCG/ACT NA SUSP
2.0000 | Freq: Every day | NASAL | 0 refills | Status: DC
  Filled 2021-03-26 – 2021-04-03 (×2): qty 16, 30d supply, fill #0

## 2021-04-01 ENCOUNTER — Encounter: Payer: Self-pay | Admitting: Plastic Surgery

## 2021-04-01 ENCOUNTER — Other Ambulatory Visit: Payer: Self-pay

## 2021-04-01 ENCOUNTER — Ambulatory Visit (INDEPENDENT_AMBULATORY_CARE_PROVIDER_SITE_OTHER): Payer: Self-pay | Admitting: Plastic Surgery

## 2021-04-01 DIAGNOSIS — Z719 Counseling, unspecified: Secondary | ICD-10-CM

## 2021-04-01 DIAGNOSIS — Z0000 Encounter for general adult medical examination without abnormal findings: Secondary | ICD-10-CM | POA: Insufficient documentation

## 2021-04-01 NOTE — Progress Notes (Signed)
[facility]  Preoperative Dx: Hyperpigmentation and redness of  Postoperative Dx:  same  Procedure: laser to face  Anesthesia: none  Description of Procedure:  Risks and complications were explained to the patient. Consent was confirmed and signed. Eye protection was placed. Time out was called and all information was confirmed to be correct. The area  area was prepped with alcohol and wiped dry. The BBL laser was set at 560 nm and 7 J/cm2 then 515 nm and 6 J/cm2. The face was lasered. The patient tolerated the procedure well and there were no complications. The patient is to follow up in 4 weeks.

## 2021-04-03 ENCOUNTER — Other Ambulatory Visit (HOSPITAL_COMMUNITY): Payer: Self-pay

## 2021-04-11 ENCOUNTER — Encounter: Payer: 59 | Admitting: Family Medicine

## 2021-04-17 ENCOUNTER — Other Ambulatory Visit: Payer: Self-pay | Admitting: Family Medicine

## 2021-04-17 DIAGNOSIS — Z1231 Encounter for screening mammogram for malignant neoplasm of breast: Secondary | ICD-10-CM

## 2021-04-19 ENCOUNTER — Encounter: Payer: Self-pay | Admitting: Radiology

## 2021-04-22 DIAGNOSIS — Z1231 Encounter for screening mammogram for malignant neoplasm of breast: Secondary | ICD-10-CM

## 2021-04-23 ENCOUNTER — Ambulatory Visit
Admission: RE | Admit: 2021-04-23 | Discharge: 2021-04-23 | Disposition: A | Discharge status: Home / Self Care | Payer: 59 | Source: Ambulatory Visit | Attending: Family Medicine | Admitting: Family Medicine

## 2021-04-23 DIAGNOSIS — Z1231 Encounter for screening mammogram for malignant neoplasm of breast: Secondary | ICD-10-CM | POA: Diagnosis not present

## 2021-04-28 ENCOUNTER — Encounter: Payer: 59 | Admitting: Family Medicine

## 2021-04-29 ENCOUNTER — Other Ambulatory Visit (HOSPITAL_COMMUNITY): Payer: Self-pay

## 2021-04-29 ENCOUNTER — Ambulatory Visit (INDEPENDENT_AMBULATORY_CARE_PROVIDER_SITE_OTHER): Payer: 59 | Admitting: Family Medicine

## 2021-04-29 ENCOUNTER — Other Ambulatory Visit: Payer: Self-pay

## 2021-04-29 ENCOUNTER — Encounter: Payer: Self-pay | Admitting: Plastic Surgery

## 2021-04-29 ENCOUNTER — Encounter: Payer: Self-pay | Admitting: Family Medicine

## 2021-04-29 ENCOUNTER — Ambulatory Visit (INDEPENDENT_AMBULATORY_CARE_PROVIDER_SITE_OTHER): Payer: Self-pay | Admitting: Plastic Surgery

## 2021-04-29 VITALS — BP 90/60 | HR 56 | Temp 98.0°F | Ht 62.0 in | Wt 118.4 lb

## 2021-04-29 DIAGNOSIS — E559 Vitamin D deficiency, unspecified: Secondary | ICD-10-CM | POA: Diagnosis not present

## 2021-04-29 DIAGNOSIS — Z719 Counseling, unspecified: Secondary | ICD-10-CM

## 2021-04-29 DIAGNOSIS — E785 Hyperlipidemia, unspecified: Secondary | ICD-10-CM

## 2021-04-29 DIAGNOSIS — J302 Other seasonal allergic rhinitis: Secondary | ICD-10-CM | POA: Diagnosis not present

## 2021-04-29 DIAGNOSIS — Z0000 Encounter for general adult medical examination without abnormal findings: Secondary | ICD-10-CM

## 2021-04-29 LAB — COMPREHENSIVE METABOLIC PANEL
ALT: 13 U/L (ref 0–35)
AST: 20 U/L (ref 0–37)
Albumin: 4.6 g/dL (ref 3.5–5.2)
Alkaline Phosphatase: 41 U/L (ref 39–117)
BUN: 17 mg/dL (ref 6–23)
CO2: 27 mEq/L (ref 19–32)
Calcium: 9.2 mg/dL (ref 8.4–10.5)
Chloride: 104 mEq/L (ref 96–112)
Creatinine, Ser: 0.81 mg/dL (ref 0.40–1.20)
GFR: 90.33 mL/min (ref >60.00)
Glucose, Bld: 83 mg/dL (ref 70–99)
Potassium: 3.9 mEq/L (ref 3.5–5.1)
Sodium: 138 mEq/L (ref 135–145)
Total Bilirubin: 0.7 mg/dL (ref 0.2–1.2)
Total Protein: 6.9 g/dL (ref 6.0–8.3)

## 2021-04-29 LAB — CBC
HCT: 38.4 % (ref 36.0–46.0)
Hemoglobin: 12.8 g/dL (ref 12.0–15.0)
MCHC: 33.4 g/dL (ref 30.0–36.0)
MCV: 95.6 fl (ref 78.0–100.0)
Platelets: 211 10*3/uL (ref 150.0–400.0)
RBC: 4.02 Mil/uL (ref 3.87–5.11)
RDW: 12.9 % (ref 11.5–15.5)
WBC: 3.5 10*3/uL — ABNORMAL LOW (ref 4.0–10.5)

## 2021-04-29 LAB — LIPID PANEL
Cholesterol: 197 mg/dL (ref 0–200)
HDL: 68.2 mg/dL (ref >39.00)
LDL Cholesterol: 120 mg/dL — ABNORMAL HIGH (ref 0–99)
NonHDL: 128.8
Total CHOL/HDL Ratio: 3
Triglycerides: 42 mg/dL (ref 0.0–149.0)
VLDL: 8.4 mg/dL (ref 0.0–40.0)

## 2021-04-29 LAB — TSH: TSH: 1.34 u[IU]/mL (ref 0.35–5.50)

## 2021-04-29 LAB — VITAMIN D 25 HYDROXY (VIT D DEFICIENCY, FRACTURES): VITD: 45.49 ng/mL (ref 30.00–100.00)

## 2021-04-29 MED ORDER — FLUTICASONE PROPIONATE 50 MCG/ACT NA SUSP
2.0000 | Freq: Every day | NASAL | 2 refills | Status: AC
  Filled 2021-04-29: qty 16, 30d supply, fill #0

## 2021-04-29 NOTE — Progress Notes (Signed)
Annual Exam   Chief Complaint:  Chief Complaint  Patient presents with   Annual Exam    No concerns     History of Present Illness:  Ms. [name] is a 41 y.o. [id number] who LMP was Patient's last menstrual period was 03/08/2013., presents today for her annual examination.    Cold intolerance More tired Family with thyroid disorders One finger raynaud's   Nutrition Diet: low carbs with celiac - meat/fruits veggie Exercise: working out 5-6 days a week She does get adequate calcium and Vitamin D in her diet.   Social History   Tobacco Use  Smoking Status Never  Smokeless Tobacco Never   Social History   Substance and Sexual Activity  Alcohol Use Yes   Comment: 3 times a week   Social History   Substance and Sexual Activity  Drug Use No    Safety The patient wears seatbelts: yes.     The patient feels safe at home and in their relationships: yes.  General Health Dentist in the last year: Yes Eye doctor: yes  Menstrual S/p hysterectomy  GYN She is single partner, contraception - status post hysterectomy.    Cervical Cancer Screening:   S/p hysterectomy 2014    Breast Cancer Screening There is no FH of breast cancer. There is no FH of ovarian cancer. BRCA screening Not Indicated.  Discussed that for average risk women between age 25-49 screening may reduce the risk of breast cancer death, however, at a lower rate than those over age 79. And that the the false-positive rates resulting in unnecessary biopsies with more screening is higher. The balance of benefits vs harms likely improves as you progress through your 40s. The patient does want a mammogram this year.     Weight Wt Readings from Last 3 Encounters:  04/29/21 118 lb 6 oz (53.7 kg)  06/20/20 123 lb 9.6 oz (56.1 kg)  02/28/20 120 lb 4 oz (54.5 kg)   Patient has normal BMI  BMI Readings from Last 1 Encounters:  04/29/21 21.65 kg/m     Chronic disease screening Blood pressure  monitoring:  BP Readings from Last 3 Encounters:  04/29/21 90/60  06/20/20 100/65  02/28/20 100/80    Lipid Monitoring: Indication for screening: age >36, obesity, diabetes, family hx, CV risk factors.  Lipid screening: Yes  Lab Results  Component Value Date   CHOL 183 02/28/2020   HDL 60.90 02/28/2020   LDLCALC 116 (H) 02/28/2020   TRIG 35.0 02/28/2020   CHOLHDL 3 02/28/2020     Diabetes Screening: age >84, overweight, family hx, PCOS, hx of gestational diabetes, at risk ethnicity Diabetes Screening screening: Not Indicated  Lab Results  Component Value Date   HGBA1C 5.3 01/14/2015     Past Medical History:  Diagnosis Date   Basal cell carcinoma of nasolabial crease 2014   Celiac disease    Hyperlipidemia    DIET CONTROLLED   PONV (postoperative nausea and vomiting)    SVD (spontaneous vaginal delivery)    X 2   Uterine prolapse 08/2009    Past Surgical History:  Procedure Laterality Date   BILATERAL SALPINGECTOMY Bilateral 01/24/2014   Procedure: BILATERAL SALPINGECTOMY;  Surgeon: Emily Filbert, MD;  Location: [facility];  Service: Gynecology;  Laterality: Bilateral;   CYSTOCELE REPAIR N/A 01/24/2014   Procedure: ANTERIOR REPAIR (CYSTOCELE);  Surgeon: Emily Filbert, MD;  Location: [facility];  Service: Gynecology;  Laterality: N/A;   SKIN CANCER EXCISION  NOSE X 3   TONSILLECTOMY AND ADENOIDECTOMY  1995   VAGINAL HYSTERECTOMY N/A 01/24/2014   Procedure: HYSTERECTOMY VAGINAL;  Surgeon: Emily Filbert, MD;  Location: [facility];  Service: Gynecology;  Laterality: N/A;   WISDOM TOOTH EXTRACTION      Prior to Admission medications   Medication Sig Start Date End Date Taking? Authorizing Provider  cetirizine (ZYRTEC) 10 MG tablet Take 1 tablet (10 mg total) by mouth daily. 05/23/18  Yes Lucille Passy, MD  fluticasone [facility]) 50 MCG/ACT nasal spray Place 2 sprays into both nostrils daily. 03/26/21  Yes Lesleigh Noe, MD  Multiple Vitamin (MULTIVITAMIN) tablet Take 1 tablet by  mouth daily.   Yes [provider]    No Known Allergies  Gynecologic History: Patient's last menstrual period was 03/08/2013.  Obstetric History: G2P2002  Social History   Socioeconomic History   Marital status: Married    Spouse name: Jeneen Rinks   Number of children: 2   Years of education: [employer] school   Highest education level: Not on file  Occupational History   Not on file  Tobacco Use   Smoking status: Never   Smokeless tobacco: Never  Vaping Use   Vaping Use: Never used  Substance and Sexual Activity   Alcohol use: Yes    Comment: 3 times a week   Drug use: No   Sexual activity: Yes    Partners: Male    Birth control/protection: Surgical  Other Topics Concern   Not on file  Social History Narrative   02/28/20   From: [place]   Living: with husband, Jimmy (2009) - high school sweethearts    Work: [employer] for [employer].      Family: Adalyn (2015) and [name] (2011)      Enjoys: spend time with children, travel soccer games w/ children      Exercise: 6 days a weeks - weights and HIIT   Diet: celiac - veggies, fruit, protein      Safety   Seat belts: Yes    Guns: Yes  and secure   Safe in relationships: Yes    Social Determinants of Health   Financial Resource Strain: Not on file  Food Insecurity: Not on file  Transportation Needs: Not on file  Physical Activity: Not on file  Stress: Not on file  Social Connections: Not on file  Intimate Partner Violence: Not on file    Family History  Problem Relation Age of Onset   Hypothyroidism Mother    Hyperlipidemia Father    Celiac disease Sister    Hashimoto's thyroiditis Sister    Heart disease Paternal Grandfather 63       multiple CABG   Hyperlipidemia Paternal Grandfather    Cancer Paternal Grandmother        pancreatic cancer    Review of Systems  Constitutional:  Negative for chills and fever.  HENT:  Negative for congestion and sore throat.   Eyes:  Negative for blurred  vision and double vision.  Respiratory:  Negative for shortness of breath.   Cardiovascular:  Negative for chest pain.  Gastrointestinal:  Negative for heartburn, nausea and vomiting.  Genitourinary: Negative.   Musculoskeletal: Negative.  Negative for myalgias.  Skin:  Negative for rash.  Neurological:  Negative for dizziness and headaches.  Endo/Heme/Allergies:        Cold intolerance  Psychiatric/Behavioral:  Negative for depression. The patient is not nervous/anxious.     Physical Exam BP 90/60  Pulse (!) 56    Temp 98 F (36.7 C) (Oral)    Ht _0  (1.575 m)    Wt 118 lb 6 oz (53.7 kg)    LMP 03/08/2013    SpO2 98%    BMI 21.65 kg/m    BP Readings from Last 3 Encounters:  04/29/21 90/60  06/20/20 100/65  02/28/20 100/80      Physical Exam Constitutional:      General: She is not in acute distress.    Appearance: She is well-developed. She is not diaphoretic.  HENT:     Head: Normocephalic and atraumatic.     Right Ear: External ear normal.     Left Ear: External ear normal.     Nose: Nose normal.  Eyes:     General: No scleral icterus.    Extraocular Movements: Extraocular movements intact.     Conjunctiva/sclera: Conjunctivae normal.  Cardiovascular:     Rate and Rhythm: Normal rate and regular rhythm.     Heart sounds: No murmur heard. Pulmonary:     Effort: Pulmonary effort is normal. No respiratory distress.     Breath sounds: Normal breath sounds. No wheezing.  Abdominal:     General: Bowel sounds are normal. There is no distension.     Palpations: Abdomen is soft. There is no mass.     Tenderness: There is no abdominal tenderness. There is no guarding or rebound.  Musculoskeletal:        General: Normal range of motion.     Cervical back: Neck supple.  Lymphadenopathy:     Cervical: No cervical adenopathy.  Skin:    General: Skin is warm and dry.     Capillary Refill: Capillary refill takes less than 2 seconds.  Neurological:     Mental Status:  She is alert and oriented to person, place, and time.     Deep Tendon Reflexes: Reflexes normal.  Psychiatric:        Mood and Affect: Mood normal.        Behavior: Behavior normal.     Results:  PHQ-9:  [place] Office Visit from 02/28/2020 in [place] at [place]  PHQ-9 Total Score 2         Assessment: 41 y.o. [id number] female here for routine annual physical examination.  Plan: Problem List Items Addressed This Visit       Other   Hyperlipidemia   Relevant Orders   Lipid panel   Seasonal allergies   Relevant Medications   fluticasone (FLONASE) 50 MCG/ACT nasal spray   Other Visit Diagnoses     Annual physical exam    -  Primary   Relevant Orders   TSH   Comprehensive metabolic panel   CBC   Vitamin D deficiency       Relevant Orders   Vitamin D, 25-hydroxy       Screening: -- Blood pressure screen normal -- cholesterol screening: will obtain -- Weight screening: normal -- Diabetes Screening: not due for screening -- Nutrition: Encouraged healthy diet  The 10-year ASCVD risk score (Arnett DK, et al., 2019) is: 0.2%   Values used to calculate the score:     Age: 64 years     Sex: Female     Is Non-Hispanic African American: No     Diabetic: No     Tobacco smoker: No     Systolic Blood Pressure: 90 mmHg     Is BP treated: No  HDL Cholesterol: 60.9 mg/dL     Total Cholesterol: 183 mg/dL  -- Statin therapy for Age 26-75 with CVD risk >7.5%  Psych -- Depression screening (PHQ-9):  [place] Office Visit from 02/28/2020 in [place] at [facility]  PHQ-9 Total Score 2        Safety -- tobacco screening: not using -- alcohol screening:  low-risk usage. -- no evidence of domestic violence or intimate partner violence.   Cancer Screening -- pap smear not collected per ASCCP guidelines -- family history of breast cancer screening: done. not at high risk. -- Mammogram -  up to date -- Colon cancer (age 60+)--  not indicated  Immunizations Immunization History  Administered Date(s) Administered   Influenza Whole 10/24/2012   Influenza,inj,Quad PF,6+ Mos 11/17/2019, 11/22/2020   Influenza-Unspecified 11/24/2017   PFIZER(Purple Top)SARS-COV-2 Vaccination 02/13/2019, 03/04/2019, 12/01/2019   Pfizer Covid-19 Vaccine Bivalent Booster 81yr & up 11/04/2020   Tdap 09/15/2013    -- flu vaccine up to date -- TDAP q10 years up to date -- Covid-19 Vaccine up to date   Encouraged healthy diet and exercise. Encouraged regular vision and dental care.   JLesleigh Noe MD

## 2021-04-29 NOTE — Progress Notes (Signed)
[facility] ? ?Preoperative Dx: hyperpigmentation of face ? ?Postoperative Dx:  same ? ?Procedure: laser to face  ? ?Anesthesia: none ? ?Description of Procedure:  ?Risks and complications were explained to the patient. Consent was confirmed and signed. Eye protection was placed. Time out was called and all information was confirmed to be correct. The area  area was prepped with alcohol and wiped dry. The BBl laser was set at 560 nm and 7 J/cm2. Then 515 nm and 6 J.  The face was lasered. The patient tolerated the procedure well and there were no complications. The patient is to follow up in 4 weeks. ? ? ?

## 2021-04-30 ENCOUNTER — Encounter: Payer: Self-pay | Admitting: Family Medicine

## 2021-05-07 ENCOUNTER — Other Ambulatory Visit (HOSPITAL_COMMUNITY): Payer: Self-pay

## 2021-05-27 ENCOUNTER — Encounter: Payer: Self-pay | Admitting: Plastic Surgery

## 2021-05-27 ENCOUNTER — Ambulatory Visit (INDEPENDENT_AMBULATORY_CARE_PROVIDER_SITE_OTHER): Payer: Self-pay | Admitting: Plastic Surgery

## 2021-05-27 DIAGNOSIS — Z719 Counseling, unspecified: Secondary | ICD-10-CM

## 2021-05-27 NOTE — Progress Notes (Signed)
IP[facility] ?Preoperative Dx: hyperpigmentation of face ? ?Postoperative Dx:  same ? ?Procedure: laser to face  ? ?Anesthesia: none ? ?Description of Procedure:  ?Risks and complications were explained to the patient. Consent was confirmed and signed. Eye protection was placed. Time out was called and all information was confirmed to be correct. The area  area was prepped with alcohol and wiped dry. The IPL laser was set at 530 nm at 7.6 J/cm2. The face was lasered. The patient tolerated the procedure well and there were no complications. The patient is to follow up in 4 weeks. ? ? ?

## 2021-06-24 ENCOUNTER — Encounter: Payer: Self-pay | Admitting: Plastic Surgery

## 2021-06-24 ENCOUNTER — Ambulatory Visit (INDEPENDENT_AMBULATORY_CARE_PROVIDER_SITE_OTHER): Payer: Self-pay | Admitting: Plastic Surgery

## 2021-06-24 DIAGNOSIS — Z719 Counseling, unspecified: Secondary | ICD-10-CM

## 2021-06-24 NOTE — Progress Notes (Signed)
[facility] ?Preoperative Dx: Hyperpigmentation ? ?Postoperative Dx:  same ? ?Procedure: laser to face  ? ?Anesthesia: none ? ?Description of Procedure:  ?Risks and complications were explained to the patient. Consent was confirmed and signed. Eye protection was placed. Time out was called and all information was confirmed to be correct. The area  area was prepped with alcohol and wiped dry. The BBL laser was set at 560 nm and 6.5-7 J/cm2. Then 515 nm and 6.5 J. The face was lasered. The patient tolerated the procedure well and there were no complications. The patient is to follow up in 4 weeks. ? ? ?

## 2021-07-09 DIAGNOSIS — H5213 Myopia, bilateral: Secondary | ICD-10-CM | POA: Diagnosis not present

## 2021-09-05 DIAGNOSIS — D229 Melanocytic nevi, unspecified: Secondary | ICD-10-CM | POA: Diagnosis not present

## 2021-09-05 DIAGNOSIS — R229 Localized swelling, mass and lump, unspecified: Secondary | ICD-10-CM | POA: Diagnosis not present

## 2021-09-05 DIAGNOSIS — L814 Other melanin hyperpigmentation: Secondary | ICD-10-CM | POA: Diagnosis not present

## 2021-09-05 DIAGNOSIS — Z85828 Personal history of other malignant neoplasm of skin: Secondary | ICD-10-CM | POA: Diagnosis not present

## 2021-09-05 DIAGNOSIS — Z08 Encounter for follow-up examination after completed treatment for malignant neoplasm: Secondary | ICD-10-CM | POA: Diagnosis not present

## 2021-09-05 DIAGNOSIS — L812 Freckles: Secondary | ICD-10-CM | POA: Diagnosis not present

## 2021-09-05 DIAGNOSIS — L821 Other seborrheic keratosis: Secondary | ICD-10-CM | POA: Diagnosis not present

## 2021-10-15 ENCOUNTER — Encounter: Payer: 59 | Admitting: Family Medicine

## 2021-10-15 ENCOUNTER — Encounter: Payer: Self-pay | Admitting: Family Medicine

## 2021-10-15 ENCOUNTER — Ambulatory Visit (INDEPENDENT_AMBULATORY_CARE_PROVIDER_SITE_OTHER): Payer: 59 | Admitting: Family Medicine

## 2021-10-15 VITALS — BP 105/70 | HR 49 | Wt 124.0 lb

## 2021-10-15 DIAGNOSIS — Z01419 Encounter for gynecological examination (general) (routine) without abnormal findings: Secondary | ICD-10-CM

## 2021-10-15 NOTE — Patient Instructions (Signed)

## 2021-10-15 NOTE — Progress Notes (Signed)
Subjective:     [name] is a 41 y.o. female and is here for a comprehensive physical exam. The patient reports no problems. 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 Normal mammogram 04/2021.  The following portions of the patient's history were reviewed and updated as appropriate: allergies, current medications, past family history, past medical history, past social history, past surgical history, and problem list.  Review of Systems Pertinent items noted in HPI and remainder of comprehensive ROS otherwise negative.   Objective:    BP 105/70   Pulse (!) 49   Wt 124 lb (56.2 kg)   LMP 03/08/2013   BMI 22.68 kg/m  General appearance: alert, cooperative, and appears stated age Head: Normocephalic, without obvious abnormality, atraumatic Neck: no adenopathy, supple, symmetrical, trachea midline, and thyroid not enlarged, symmetric, no tenderness/mass/nodules Lungs: clear to auscultation bilaterally Breasts: normal appearance, no masses or tenderness Heart: regular rate and rhythm, S1, S2 normal, no murmur, click, rub or gallop Abdomen: soft, non-tender; bowel sounds normal; no masses,  no organomegaly Pelvic: external genitalia normal, no adnexal masses or tenderness, uterus surgically absent, and vagina normal without discharge Extremities: extremities normal, atraumatic, no cyanosis or edema Pulses: 2+ and symmetric Skin: Skin color, texture, turgor normal. No rashes or lesions Lymph nodes: Cervical, supraclavicular, and axillary nodes normal. Neurologic: Grossly normal    Assessment:    Healthy female exam.      Plan:  Encounter for gynecological examination without abnormal finding - does not need paps, s/p TVH--she does not need yearly GYN exams if her PCP orders her mammograms and does her annual CBE.  Return if symptoms worsen or fail to improve.    See After Visit Summary for Counseling Recommendations

## 2021-10-15 NOTE — Progress Notes (Signed)
Annual Exam.  Vaginal HYST 2015 Last Mammogram:04/23/21 WNL    Pt asked if she needs appt yearly appt since she has had HYST.

## 2021-12-16 DIAGNOSIS — M5412 Radiculopathy, cervical region: Secondary | ICD-10-CM | POA: Diagnosis not present

## 2021-12-24 HISTORY — PX: NECK SURGERY: SHX720

## 2021-12-25 DIAGNOSIS — M5020 Other cervical disc displacement, unspecified cervical region: Secondary | ICD-10-CM | POA: Diagnosis not present

## 2021-12-25 DIAGNOSIS — M5412 Radiculopathy, cervical region: Secondary | ICD-10-CM | POA: Diagnosis not present

## 2021-12-30 DIAGNOSIS — M5412 Radiculopathy, cervical region: Secondary | ICD-10-CM | POA: Diagnosis not present

## 2021-12-30 DIAGNOSIS — M542 Cervicalgia: Secondary | ICD-10-CM | POA: Diagnosis not present

## 2022-01-19 ENCOUNTER — Other Ambulatory Visit (HOSPITAL_COMMUNITY): Payer: Self-pay

## 2022-01-19 DIAGNOSIS — M5412 Radiculopathy, cervical region: Secondary | ICD-10-CM | POA: Diagnosis not present

## 2022-01-19 DIAGNOSIS — M50121 Cervical disc disorder at C4-C5 level with radiculopathy: Secondary | ICD-10-CM | POA: Diagnosis not present

## 2022-01-19 DIAGNOSIS — M50122 Cervical disc disorder at C5-C6 level with radiculopathy: Secondary | ICD-10-CM | POA: Diagnosis not present

## 2022-01-19 MED ORDER — ONDANSETRON HCL 4 MG PO TABS
8.0000 mg | ORAL_TABLET | Freq: Two times a day (BID) | ORAL | 0 refills | Status: DC
  Filled 2022-01-19: qty 30, 8d supply, fill #0

## 2022-01-19 MED ORDER — OXYCODONE-ACETAMINOPHEN 5-325 MG PO TABS
1.0000 | ORAL_TABLET | Freq: Four times a day (QID) | ORAL | 0 refills | Status: DC | PRN
  Filled 2022-01-19: qty 30, 7d supply, fill #0

## 2022-01-19 MED ORDER — TIZANIDINE HCL 2 MG PO TABS
2.0000 mg | ORAL_TABLET | ORAL | 0 refills | Status: DC
  Filled 2022-01-19: qty 45, 15d supply, fill #0

## 2022-01-20 ENCOUNTER — Other Ambulatory Visit (HOSPITAL_COMMUNITY): Payer: Self-pay

## 2022-01-22 ENCOUNTER — Other Ambulatory Visit (HOSPITAL_COMMUNITY): Payer: Self-pay

## 2022-01-22 MED ORDER — METHYLPREDNISOLONE 4 MG PO TBPK
ORAL_TABLET | ORAL | 0 refills | Status: DC
  Filled 2022-01-22: qty 21, 6d supply, fill #0

## 2022-02-03 DIAGNOSIS — M542 Cervicalgia: Secondary | ICD-10-CM | POA: Diagnosis not present

## 2022-02-03 DIAGNOSIS — M5412 Radiculopathy, cervical region: Secondary | ICD-10-CM | POA: Diagnosis not present

## 2022-02-18 DIAGNOSIS — M542 Cervicalgia: Secondary | ICD-10-CM | POA: Diagnosis not present

## 2022-02-26 ENCOUNTER — Telehealth: Payer: Self-pay | Admitting: Family Medicine

## 2022-02-26 NOTE — Telephone Encounter (Signed)
Pt scheduled new patient visit with [name] for 03/20/22. After calling pt to confirm if she wanted to transfer care from Dr. Einar Pheasant (current pcp) to [name], patient stated her PCP had left the practice.   Informed patient that this visit would be a transfer of care visit with Katherine. Also informed her that a CPE may not be completed due to type of visit. Informed that this would be up to the provider if it could be completed.   Patient states: - Has high deductible insurance--doesn't have to go to doctor that often  - Would like confirmation of it this visit could be a CPE as well   Please advise.

## 2022-03-04 DIAGNOSIS — M542 Cervicalgia: Secondary | ICD-10-CM | POA: Diagnosis not present

## 2022-03-10 DIAGNOSIS — Z08 Encounter for follow-up examination after completed treatment for malignant neoplasm: Secondary | ICD-10-CM | POA: Diagnosis not present

## 2022-03-10 DIAGNOSIS — L578 Other skin changes due to chronic exposure to nonionizing radiation: Secondary | ICD-10-CM | POA: Diagnosis not present

## 2022-03-10 DIAGNOSIS — D229 Melanocytic nevi, unspecified: Secondary | ICD-10-CM | POA: Diagnosis not present

## 2022-03-10 DIAGNOSIS — L570 Actinic keratosis: Secondary | ICD-10-CM | POA: Diagnosis not present

## 2022-03-10 DIAGNOSIS — R229 Localized swelling, mass and lump, unspecified: Secondary | ICD-10-CM | POA: Diagnosis not present

## 2022-03-10 DIAGNOSIS — D485 Neoplasm of uncertain behavior of skin: Secondary | ICD-10-CM | POA: Diagnosis not present

## 2022-03-10 DIAGNOSIS — Z85828 Personal history of other malignant neoplasm of skin: Secondary | ICD-10-CM | POA: Diagnosis not present

## 2022-03-10 DIAGNOSIS — L814 Other melanin hyperpigmentation: Secondary | ICD-10-CM | POA: Diagnosis not present

## 2022-03-10 DIAGNOSIS — L988 Other specified disorders of the skin and subcutaneous tissue: Secondary | ICD-10-CM | POA: Diagnosis not present

## 2022-03-11 ENCOUNTER — Other Ambulatory Visit: Payer: Self-pay | Admitting: Physician Assistant

## 2022-03-11 DIAGNOSIS — Z1231 Encounter for screening mammogram for malignant neoplasm of breast: Secondary | ICD-10-CM

## 2022-03-20 ENCOUNTER — Encounter: Payer: Self-pay | Admitting: Physician Assistant

## 2022-03-20 ENCOUNTER — Ambulatory Visit (INDEPENDENT_AMBULATORY_CARE_PROVIDER_SITE_OTHER): Payer: 59 | Admitting: Physician Assistant

## 2022-03-20 VITALS — BP 112/74 | HR 60 | Temp 97.7°F | Ht 62.6 in | Wt 125.6 lb

## 2022-03-20 DIAGNOSIS — Z0000 Encounter for general adult medical examination without abnormal findings: Secondary | ICD-10-CM | POA: Diagnosis not present

## 2022-03-20 DIAGNOSIS — E785 Hyperlipidemia, unspecified: Secondary | ICD-10-CM | POA: Diagnosis not present

## 2022-03-20 LAB — COMPREHENSIVE METABOLIC PANEL
ALT: 11 U/L (ref 0–35)
AST: 19 U/L (ref 0–37)
Albumin: 4.3 g/dL (ref 3.5–5.2)
Alkaline Phosphatase: 41 U/L (ref 39–117)
BUN: 15 mg/dL (ref 6–23)
CO2: 25 mEq/L (ref 19–32)
Calcium: 8.9 mg/dL (ref 8.4–10.5)
Chloride: 103 mEq/L (ref 96–112)
Creatinine, Ser: 0.61 mg/dL (ref 0.40–1.20)
GFR: 110.55 mL/min (ref >60.00)
Glucose, Bld: 77 mg/dL (ref 70–99)
Potassium: 3.7 mEq/L (ref 3.5–5.1)
Sodium: 137 mEq/L (ref 135–145)
Total Bilirubin: 0.5 mg/dL (ref 0.2–1.2)
Total Protein: 6.8 g/dL (ref 6.0–8.3)

## 2022-03-20 LAB — CBC WITH DIFFERENTIAL/PLATELET
Basophils Absolute: 0 10*3/uL (ref 0.0–0.1)
Basophils Relative: 0.4 % (ref 0.0–3.0)
Eosinophils Absolute: 0.1 10*3/uL (ref 0.0–0.7)
Eosinophils Relative: 1.8 % (ref 0.0–5.0)
HCT: 38.2 % (ref 36.0–46.0)
Hemoglobin: 13 g/dL (ref 12.0–15.0)
Lymphocytes Relative: 24.2 % (ref 12.0–46.0)
Lymphs Abs: 1.8 10*3/uL (ref 0.7–4.0)
MCHC: 33.9 g/dL (ref 30.0–36.0)
MCV: 95.7 fl (ref 78.0–100.0)
Monocytes Absolute: 0.6 10*3/uL (ref 0.1–1.0)
Monocytes Relative: 8.1 % (ref 3.0–12.0)
Neutro Abs: 4.8 10*3/uL (ref 1.4–7.7)
Neutrophils Relative %: 65.5 % (ref 43.0–77.0)
Platelets: 196 10*3/uL (ref 150.0–400.0)
RBC: 3.99 Mil/uL (ref 3.87–5.11)
RDW: 12.8 % (ref 11.5–15.5)
WBC: 7.3 10*3/uL (ref 4.0–10.5)

## 2022-03-20 LAB — LIPID PANEL
Cholesterol: 172 mg/dL (ref 0–200)
HDL: 69.2 mg/dL (ref >39.00)
LDL Cholesterol: 96 mg/dL (ref 0–99)
NonHDL: 103.04
Total CHOL/HDL Ratio: 2
Triglycerides: 34 mg/dL (ref 0.0–149.0)
VLDL: 6.8 mg/dL (ref 0.0–40.0)

## 2022-03-20 LAB — TSH: TSH: 1.19 u[IU]/mL (ref 0.35–5.50)

## 2022-03-20 NOTE — Progress Notes (Signed)
Subjective:    Patient ID: [name], female    DOB: 1980/08/16, 42 y.o.   MRN: [id number]  Chief Complaint  Patient presents with   Transitions Of Care    Former Dr Einar Pheasant pt TOC to [place]; wants a CPE fasting labs; no concerns but wants to take thyroid due to family history.     HPI Patient is in today for annual exam. Married, two children, 12 & 8. Works as PA in Cardiothoracic surgery.   Dermatology every 6 months for skin checks and Efudex compound treatment  Health maintenance: Lifestyle/ exercise: Works out at home 5-6 days per week Nutrition: Good, well-balanced Mental health: No concerns Caffeine: Celsius, rare coffee  Sleep: Normal when schedule is normal Substance use: None  Sexual activity: Monogamous, no STD concerns  Immunizations: UTD Colonoscopy: Will do at age 42  Pap: Discontinued  Mammogram: Annually    Past Medical History:  Diagnosis Date   Basal cell carcinoma of nasolabial crease 2014   Celiac disease    Hyperlipidemia    DIET CONTROLLED   PONV (postoperative nausea and vomiting)    SVD (spontaneous vaginal delivery)    X 2   Uterine prolapse 08/2009    Past Surgical History:  Procedure Laterality Date   BILATERAL SALPINGECTOMY Bilateral 01/24/2014   Procedure: BILATERAL SALPINGECTOMY;  Surgeon: Emily Filbert, MD;  Location: [facility];  Service: Gynecology;  Laterality: Bilateral;   CYSTOCELE REPAIR N/A 01/24/2014   Procedure: ANTERIOR REPAIR (CYSTOCELE);  Surgeon: Emily Filbert, MD;  Location: [facility];  Service: Gynecology;  Laterality: N/A;   NECK SURGERY  12/2021   two-level total disc replacement C4-5 C5-6   SKIN CANCER EXCISION     NOSE X 3   TONSILLECTOMY AND ADENOIDECTOMY  1995   VAGINAL HYSTERECTOMY N/A 01/24/2014   Procedure: HYSTERECTOMY VAGINAL;  Surgeon: Emily Filbert, MD;  Location: [facility];  Service: Gynecology;  Laterality: N/A;   WISDOM TOOTH EXTRACTION      Family History  Problem Relation Age of Onset   Hypothyroidism  Mother    Hyperlipidemia Father    Celiac disease Sister    Hashimoto's thyroiditis Sister    Melanoma Sister    Cancer Paternal Grandmother        pancreatic cancer   Heart disease Paternal Grandfather 60       multiple CABG   Hyperlipidemia Paternal Grandfather     Social History   Tobacco Use   Smoking status: Never   Smokeless tobacco: Never  Vaping Use   Vaping Use: Never used  Substance Use Topics   Alcohol use: Yes    Comment: 3 times a week   Drug use: No     No Known Allergies  Review of Systems NEGATIVE UNLESS OTHERWISE INDICATED IN HPI      Objective:     BP 112/74 (BP Location: Left Arm)   Pulse 60   Temp 97.7 F (36.5 C) (Temporal)   Ht 5' 2.6" (1.59 m)   Wt 125 lb 9.6 oz (57 kg)   LMP 03/08/2013   SpO2 100%   BMI 22.54 kg/m   Wt Readings from Last 3 Encounters:  03/20/22 125 lb 9.6 oz (57 kg)  10/15/21 124 lb (56.2 kg)  04/29/21 118 lb 6 oz (53.7 kg)    BP Readings from Last 3 Encounters:  03/20/22 112/74  10/15/21 105/70  04/29/21 90/60     Physical Exam Vitals and nursing note reviewed.  Constitutional:  Appearance: Normal appearance. She is normal weight. She is not toxic-appearing.  HENT:     Head: Normocephalic and atraumatic.     Right Ear: Tympanic membrane, ear canal and external ear normal.     Left Ear: Tympanic membrane, ear canal and external ear normal.     Nose: Nose normal.     Mouth/Throat:     Mouth: Mucous membranes are moist.  Eyes:     Extraocular Movements: Extraocular movements intact.     Conjunctiva/sclera: Conjunctivae normal.     Pupils: Pupils are equal, round, and reactive to light.  Cardiovascular:     Rate and Rhythm: Normal rate and regular rhythm.     Pulses: Normal pulses.     Heart sounds: Normal heart sounds.  Pulmonary:     Effort: Pulmonary effort is normal.     Breath sounds: Normal breath sounds.  Abdominal:     General: Abdomen is flat. Bowel sounds are normal.     Palpations:  Abdomen is soft.     Tenderness: There is no right CVA tenderness or left CVA tenderness.  Musculoskeletal:        General: Normal range of motion.     Cervical back: Normal range of motion and neck supple.     Right lower leg: No edema.     Left lower leg: No edema.  Skin:    General: Skin is warm and dry.     Findings: No lesion or rash.     Comments: New surgical scar anterior neck, healing well   Neurological:     General: No focal deficit present.     Mental Status: She is alert and oriented to person, place, and time.  Psychiatric:        Mood and Affect: Mood normal.        Behavior: Behavior normal.        Thought Content: Thought content normal.        Judgment: Judgment normal.        Assessment & Plan:  Encounter for annual physical exam -     CBC with Differential/Platelet -     Comprehensive metabolic panel -     Lipid panel -     TSH  Hyperlipidemia, unspecified hyperlipidemia type -     Lipid panel   Age-appropriate screening and counseling performed today. Will check labs and call with results. Doing very well with health overall.  Preventive measures discussed and printed in AVS for patient.   Patient Counseling: '[x]'$   Nutrition: Stressed importance of moderation in sodium/caffeine intake, saturated fat and cholesterol, caloric balance, sufficient intake of fresh fruits, vegetables, and fiber.  '[x]'$   Stressed the importance of regular exercise.   '[]'$   Substance Abuse: Discussed cessation/primary prevention of tobacco, alcohol, or other drug use; driving or other dangerous activities under the influence; availability of treatment for abuse.   '[]'$   Injury prevention: Discussed safety belts, safety helmets, smoke detector, smoking near bedding or upholstery.   '[]'$   Sexuality: Discussed sexually transmitted diseases, partner selection, use of condoms, avoidance of unintended pregnancy  and contraceptive alternatives.   '[x]'$   Dental health: Discussed importance of  regular tooth brushing, flossing, and dental visits.  '[x]'$   Health maintenance and immunizations reviewed. Please refer to Health maintenance section.       Return in about 1 year (around 03/21/2023) for CPE, fasting labs .   Jamin Humphries M Sherryn Pollino, PA-C

## 2022-03-20 NOTE — Patient Instructions (Signed)
Welcome to [facility]! It was a pleasure meeting you today.  Keep up the good work!   As discussed, Please schedule a 12 month follow up visit today.  PLEASE NOTE:  If you had any LAB tests please let us know if you have not heard back within a few days. You may see your results on MyChart before we have a chance to review them but we will give you a call once they are reviewed by [place]. If we ordered any REFERRALS today, please let us know if you have not heard from their office within the next two weeks. Let us know through MyChart if you are needing REFILLS, or have your pharmacy send [place] the request. You can also use MyChart to communicate with me or any office staff.  Please try these tips to maintain a healthy lifestyle:  Eat most of your calories during the day when you are active. Eliminate processed foods including packaged sweets (pies, cakes, cookies), reduce intake of potatoes, white bread, white pasta, and white rice. Look for whole grain options, oat flour or almond flour.  Each meal should contain half fruits/vegetables, one quarter protein, and one quarter carbs (no bigger than a computer mouse).  Cut down on sweet beverages. This includes juice, soda, and sweet tea. Also watch fruit intake, though this is a healthier sweet option, it still contains natural sugar! Limit to 3 servings daily.  Drink at least 1 glass of water with each meal and aim for at least 8 glasses (64 ounces) per day.  Exercise at least 150 minutes every week to the best of your ability.    Take Care,  Jurline Folger, PA-C

## 2022-04-21 DIAGNOSIS — M542 Cervicalgia: Secondary | ICD-10-CM | POA: Diagnosis not present

## 2022-04-28 ENCOUNTER — Ambulatory Visit
Admission: RE | Admit: 2022-04-28 | Discharge: 2022-04-28 | Disposition: A | Discharge status: Home / Self Care | Payer: 59 | Source: Ambulatory Visit

## 2022-04-28 DIAGNOSIS — Z1231 Encounter for screening mammogram for malignant neoplasm of breast: Secondary | ICD-10-CM | POA: Diagnosis not present

## 2022-05-29 ENCOUNTER — Other Ambulatory Visit (HOSPITAL_COMMUNITY): Payer: Self-pay

## 2022-08-12 DIAGNOSIS — M542 Cervicalgia: Secondary | ICD-10-CM | POA: Diagnosis not present

## 2022-09-18 DIAGNOSIS — Z08 Encounter for follow-up examination after completed treatment for malignant neoplasm: Secondary | ICD-10-CM | POA: Diagnosis not present

## 2022-09-18 DIAGNOSIS — Z85828 Personal history of other malignant neoplasm of skin: Secondary | ICD-10-CM | POA: Diagnosis not present

## 2022-09-18 DIAGNOSIS — L821 Other seborrheic keratosis: Secondary | ICD-10-CM | POA: Diagnosis not present

## 2022-09-18 DIAGNOSIS — L573 Poikiloderma of Civatte: Secondary | ICD-10-CM | POA: Diagnosis not present

## 2022-09-18 DIAGNOSIS — L814 Other melanin hyperpigmentation: Secondary | ICD-10-CM | POA: Diagnosis not present

## 2022-09-18 DIAGNOSIS — D229 Melanocytic nevi, unspecified: Secondary | ICD-10-CM | POA: Diagnosis not present

## 2022-09-18 DIAGNOSIS — R229 Localized swelling, mass and lump, unspecified: Secondary | ICD-10-CM | POA: Diagnosis not present

## 2022-10-30 DIAGNOSIS — H5213 Myopia, bilateral: Secondary | ICD-10-CM | POA: Diagnosis not present

## 2022-12-10 ENCOUNTER — Other Ambulatory Visit (HOSPITAL_COMMUNITY): Payer: Self-pay

## 2022-12-10 DIAGNOSIS — M542 Cervicalgia: Secondary | ICD-10-CM | POA: Diagnosis not present

## 2022-12-10 DIAGNOSIS — M5412 Radiculopathy, cervical region: Secondary | ICD-10-CM | POA: Diagnosis not present

## 2022-12-10 MED ORDER — TIZANIDINE HCL 2 MG PO TABS
2.0000 mg | ORAL_TABLET | Freq: Four times a day (QID) | ORAL | 0 refills | Status: DC
  Filled 2022-12-10: qty 45, 12d supply, fill #0

## 2022-12-23 ENCOUNTER — Other Ambulatory Visit (HOSPITAL_COMMUNITY): Payer: Self-pay

## 2023-01-01 ENCOUNTER — Other Ambulatory Visit (HOSPITAL_COMMUNITY): Payer: Self-pay

## 2023-02-22 ENCOUNTER — Ambulatory Visit: Payer: 59 | Admitting: Physician Assistant

## 2023-03-18 ENCOUNTER — Other Ambulatory Visit: Payer: Self-pay | Admitting: Physician Assistant

## 2023-03-18 DIAGNOSIS — Z1231 Encounter for screening mammogram for malignant neoplasm of breast: Secondary | ICD-10-CM

## 2023-03-22 DIAGNOSIS — L812 Freckles: Secondary | ICD-10-CM | POA: Diagnosis not present

## 2023-03-22 DIAGNOSIS — Z08 Encounter for follow-up examination after completed treatment for malignant neoplasm: Secondary | ICD-10-CM | POA: Diagnosis not present

## 2023-03-22 DIAGNOSIS — R229 Localized swelling, mass and lump, unspecified: Secondary | ICD-10-CM | POA: Diagnosis not present

## 2023-03-22 DIAGNOSIS — D1801 Hemangioma of skin and subcutaneous tissue: Secondary | ICD-10-CM | POA: Diagnosis not present

## 2023-03-22 DIAGNOSIS — Z85828 Personal history of other malignant neoplasm of skin: Secondary | ICD-10-CM | POA: Diagnosis not present

## 2023-04-02 DIAGNOSIS — Z1231 Encounter for screening mammogram for malignant neoplasm of breast: Secondary | ICD-10-CM

## 2023-04-30 ENCOUNTER — Encounter: Payer: Self-pay | Admitting: Physician Assistant

## 2023-04-30 ENCOUNTER — Ambulatory Visit
Admission: RE | Admit: 2023-04-30 | Discharge: 2023-04-30 | Disposition: A | Discharge status: Home / Self Care | Payer: 59 | Source: Ambulatory Visit | Attending: Physician Assistant | Admitting: Physician Assistant

## 2023-04-30 ENCOUNTER — Ambulatory Visit: Payer: 59 | Admitting: Physician Assistant

## 2023-04-30 VITALS — BP 86/48 | HR 46 | Temp 98.1°F | Ht 62.0 in | Wt 124.0 lb

## 2023-04-30 DIAGNOSIS — Z1231 Encounter for screening mammogram for malignant neoplasm of breast: Secondary | ICD-10-CM | POA: Diagnosis not present

## 2023-04-30 DIAGNOSIS — E559 Vitamin D deficiency, unspecified: Secondary | ICD-10-CM

## 2023-04-30 DIAGNOSIS — Z23 Encounter for immunization: Secondary | ICD-10-CM

## 2023-04-30 DIAGNOSIS — Z0000 Encounter for general adult medical examination without abnormal findings: Secondary | ICD-10-CM

## 2023-04-30 LAB — COMPREHENSIVE METABOLIC PANEL
ALT: 11 U/L (ref 0–35)
AST: 19 U/L (ref 0–37)
Albumin: 4.5 g/dL (ref 3.5–5.2)
Alkaline Phosphatase: 41 U/L (ref 39–117)
BUN: 16 mg/dL (ref 6–23)
CO2: 26 meq/L (ref 19–32)
Calcium: 9.2 mg/dL (ref 8.4–10.5)
Chloride: 105 meq/L (ref 96–112)
Creatinine, Ser: 0.68 mg/dL (ref 0.40–1.20)
GFR: 106.86 mL/min (ref >60.00)
Glucose, Bld: 83 mg/dL (ref 70–99)
Potassium: 4.1 meq/L (ref 3.5–5.1)
Sodium: 140 meq/L (ref 135–145)
Total Bilirubin: 0.6 mg/dL (ref 0.2–1.2)
Total Protein: 7.1 g/dL (ref 6.0–8.3)

## 2023-04-30 LAB — CBC WITH DIFFERENTIAL/PLATELET
Basophils Absolute: 0 10*3/uL (ref 0.0–0.1)
Basophils Relative: 0.5 % (ref 0.0–3.0)
Eosinophils Absolute: 0.1 10*3/uL (ref 0.0–0.7)
Eosinophils Relative: 2.1 % (ref 0.0–5.0)
HCT: 40.5 % (ref 36.0–46.0)
Hemoglobin: 13.6 g/dL (ref 12.0–15.0)
Lymphocytes Relative: 33.2 % (ref 12.0–46.0)
Lymphs Abs: 1.7 10*3/uL (ref 0.7–4.0)
MCHC: 33.6 g/dL (ref 30.0–36.0)
MCV: 96.3 fl (ref 78.0–100.0)
Monocytes Absolute: 0.5 10*3/uL (ref 0.1–1.0)
Monocytes Relative: 9.2 % (ref 3.0–12.0)
Neutro Abs: 2.8 10*3/uL (ref 1.4–7.7)
Neutrophils Relative %: 55 % (ref 43.0–77.0)
Platelets: 231 10*3/uL (ref 150.0–400.0)
RBC: 4.21 Mil/uL (ref 3.87–5.11)
RDW: 12.8 % (ref 11.5–15.5)
WBC: 5.1 10*3/uL (ref 4.0–10.5)

## 2023-04-30 LAB — LIPID PANEL
Cholesterol: 184 mg/dL (ref 0–200)
HDL: 56.7 mg/dL (ref >39.00)
LDL Cholesterol: 120 mg/dL — ABNORMAL HIGH (ref 0–99)
NonHDL: 127.34
Total CHOL/HDL Ratio: 3
Triglycerides: 39 mg/dL (ref 0.0–149.0)
VLDL: 7.8 mg/dL (ref 0.0–40.0)

## 2023-04-30 LAB — VITAMIN B12: Vitamin B-12: 429 pg/mL (ref 211–911)

## 2023-04-30 LAB — VITAMIN D 25 HYDROXY (VIT D DEFICIENCY, FRACTURES): VITD: 30.45 ng/mL (ref 30.00–100.00)

## 2023-04-30 LAB — TSH: TSH: 1.01 u[IU]/mL (ref 0.35–5.50)

## 2023-04-30 LAB — HEMOGLOBIN A1C: Hgb A1c MFr Bld: 5.4 % (ref 4.6–6.5)

## 2023-04-30 NOTE — Patient Instructions (Signed)
Keep up great work!

## 2023-04-30 NOTE — Progress Notes (Signed)
 Patient ID: [name], female    DOB: Feb 06, 1981, 43 y.o.   MRN: [id number]   Assessment & Plan:  Annual physical exam -     Lipid panel -     Hemoglobin A1c -     Comprehensive metabolic panel -     CBC with Differential/Platelet -     TSH -     Vitamin B12 -     VITAMIN D 25 Hydroxy (Vit-D Deficiency, Fractures)  Vitamin D deficiency -     VITAMIN D 25 Hydroxy (Vit-D Deficiency, Fractures)  Immunization due -     Tdap vaccine greater than or equal to 7yo IM    Age-appropriate screening and counseling performed today. Will check labs and call with results. Preventive measures discussed and printed in AVS for patient.  Follows regularly with dermatology.  Patient Counseling: [x]   Nutrition: Stressed importance of moderation in sodium/caffeine intake, saturated fat and cholesterol, caloric balance, sufficient intake of fresh fruits, vegetables, and fiber.  [x]   Stressed the importance of regular exercise.   []   Substance Abuse: Discussed cessation/primary prevention of tobacco, alcohol, or other drug use; driving or other dangerous activities under the influence; availability of treatment for abuse.   []   Injury prevention: Discussed safety belts, safety helmets, smoke detector, smoking near bedding or upholstery.   []   Sexuality: Discussed sexually transmitted diseases, partner selection, use of condoms, avoidance of unintended pregnancy  and contraceptive alternatives.   [x]   Dental health: Discussed importance of regular tooth brushing, flossing, and dental visits.  [x]   Health maintenance and immunizations reviewed. Please refer to Health maintenance section.        Return in about 1 year (around 04/29/2024) for physical.    Subjective:    Chief Complaint  Patient presents with   Annual Exam    Pt in office for annual CPE and fasting labs;     HPI Discussed the use of AI scribe software for clinical note transcription with the patient, who gave verbal  consent to proceed.  History of Present Illness   The patient is a 43 year old with celiac disease who presents for an annual physical exam.  She is due for a mammogram today. She has not had a colonoscopy yet but plans to have one at age 29 due to her celiac disease.  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 Her father and sister also have celiac disease, with her father diagnosed at age 53 after presenting with iron deficiency anemia and her sister diagnosed at age 12. She experiences gastrointestinal symptoms, unlike her father and sister who have vitamin deficiencies. Her children are being screened for celiac disease due to the family history.  She has a history of skin issues and visits a dermatologist twice a year. She has had five basal cell carcinomas removed. Her sister had melanoma in situ, which was treated with resection.  She reports sleep disturbances, stating she falls asleep easily but struggles to stay asleep. She has tried melatonin and Benadryl without success. She averages about seven hours of sleep per night but not consistently.  She underwent a hysterectomy due to uterine prolapse but retained her ovaries to avoid early menopause. She has not had any abnormal pap smears and reports no menstrual cycles for ten years.  Her blood pressure is typically low, but she feels fine and functions normally. No chest pain or shortness of breath is present, and she  maintains regular exercise.       Past Medical History:  Diagnosis Date   Allergy 1991   Basal cell carcinoma of nasolabial crease 02/24/2012   Celiac disease    Hyperlipidemia    DIET CONTROLLED   PONV (postoperative nausea and vomiting)    SVD (spontaneous vaginal delivery)    X 2   Uterine prolapse 08/23/2009    Past Surgical History:  Procedure Laterality Date   BILATERAL SALPINGECTOMY Bilateral 01/24/2014   Procedure: BILATERAL SALPINGECTOMY;  Surgeon: Allie Bossier, MD;   Location: [facility];  Service: Gynecology;  Laterality: Bilateral;   CYSTOCELE REPAIR N/A 01/24/2014   Procedure: ANTERIOR REPAIR (CYSTOCELE);  Surgeon: Allie Bossier, MD;  Location: [facility];  Service: Gynecology;  Laterality: N/A;   NECK SURGERY  12/2021   two-level total disc replacement C4-5 C5-6   SKIN CANCER EXCISION     NOSE X 3   TONSILLECTOMY AND ADENOIDECTOMY  1995   TOTAL VAGINAL HYSTERECTOMY  01/27/2014   *still has ovaries   TUBAL LIGATION     VAGINAL HYSTERECTOMY N/A 01/24/2014   Procedure: HYSTERECTOMY VAGINAL;  Surgeon: Allie Bossier, MD;  Location: [facility];  Service: Gynecology;  Laterality: N/A;   WISDOM TOOTH EXTRACTION      Family History  Problem Relation Age of Onset   Hypothyroidism Mother    Hyperlipidemia Father    Hypertension Father    Celiac disease Father    Celiac disease Sister    Hashimoto's thyroiditis Sister    Melanoma Sister    Cancer Sister    Cancer Paternal Grandmother        pancreatic cancer   Heart disease Paternal Grandfather 59       multiple CABG   Hyperlipidemia Paternal Grandfather    Diabetes Paternal Grandfather     Social History   Tobacco Use   Smoking status: Never   Smokeless tobacco: Never  Vaping Use   Vaping status: Never Used  Substance Use Topics   Alcohol use: Yes    Alcohol/week: 1.0 standard drink of alcohol    Types: 1 Shots of liquor per week    Comment: Rarely as of recent time   Drug use: Never     No Known Allergies  Review of Systems NEGATIVE UNLESS OTHERWISE INDICATED IN HPI      Objective:     BP (!) 86/48 (BP Location: Left Arm, Patient Position: Sitting, Cuff Size: Normal)   Pulse (!) 46   Temp 98.1 F (36.7 C) (Temporal)   Ht 5\' 2"  (1.575 m)   Wt 124 lb (56.2 kg)   LMP 03/08/2013   SpO2 97%   BMI 22.68 kg/m   Wt Readings from Last 3 Encounters:  04/30/23 124 lb (56.2 kg)  03/20/22 125 lb 9.6 oz (57 kg)  10/15/21 124 lb (56.2 kg)    BP Readings from Last 3 Encounters:  04/30/23  (!) 86/48  03/20/22 112/74  10/15/21 105/70     Physical Exam Vitals and nursing note reviewed.  Constitutional:      Appearance: Normal appearance. She is normal weight. She is not toxic-appearing.  HENT:     Head: Normocephalic and atraumatic.     Right Ear: Tympanic membrane, ear canal and external ear normal.     Left Ear: Tympanic membrane, ear canal and external ear normal.     Nose: Nose normal.     Mouth/Throat:     Mouth: Mucous membranes are moist.  Eyes:  Extraocular Movements: Extraocular movements intact.     Conjunctiva/sclera: Conjunctivae normal.     Pupils: Pupils are equal, round, and reactive to light.  Cardiovascular:     Rate and Rhythm: Normal rate and regular rhythm.     Pulses: Normal pulses.     Heart sounds: Normal heart sounds.  Pulmonary:     Effort: Pulmonary effort is normal.     Breath sounds: Normal breath sounds.  Abdominal:     General: Abdomen is flat. Bowel sounds are normal.     Palpations: Abdomen is soft.  Musculoskeletal:        General: Normal range of motion.     Cervical back: Normal range of motion and neck supple.     Right lower leg: No edema.     Left lower leg: No edema.  Skin:    General: Skin is warm and dry.     Findings: No rash.  Neurological:     General: No focal deficit present.     Mental Status: She is alert and oriented to person, place, and time.  Psychiatric:        Mood and Affect: Mood normal.        Behavior: Behavior normal.        Thought Content: Thought content normal.        Judgment: Judgment normal.        Rodriques Badie M Kalah Pflum, PA-C

## 2023-05-02 ENCOUNTER — Encounter: Payer: Self-pay | Admitting: Physician Assistant

## 2023-05-05 ENCOUNTER — Encounter: Payer: Self-pay | Admitting: Physician Assistant

## 2023-08-06 ENCOUNTER — Ambulatory Visit: Admitting: Family

## 2023-08-06 VITALS — BP 95/60 | HR 72 | Temp 98.1°F | Ht 62.0 in | Wt 127.0 lb

## 2023-08-06 DIAGNOSIS — R21 Rash and other nonspecific skin eruption: Secondary | ICD-10-CM

## 2023-08-06 MED ORDER — METHYLPREDNISOLONE ACETATE 40 MG/ML IJ SUSP: 40.0000 mg | Freq: Once | INTRAMUSCULAR | Status: DC

## 2023-08-06 MED ORDER — METHYLPREDNISOLONE ACETATE 40 MG/ML IJ SUSP
60.0000 mg | Freq: Once | INTRAMUSCULAR | Status: AC
  Administered 2023-08-06: 60 mg via INTRAMUSCULAR

## 2023-08-06 NOTE — Progress Notes (Signed)
 Patient ID: [name], female    DOB: 11-16-1980, 43 y.o.   MRN: [id number]  Chief Complaint  Patient presents with   Rash    Pt stated that she came down with a rash all over her body over a 48 hr period of time and then she woke up this morning with some joint pain all over  Discussed the use of AI scribe software for clinical note transcription with the patient, who gave verbal consent to proceed.  History of Present Illness [name] is a 43 year old female with celiac disease who presents with a rash and joint swelling.  She woke up yesterday with a widespread rash on her chest, back, and arms. The rash consists of red hive-like marks without palpable bumps and does not itch. She took Zyrtec  and a PPI, but the rash worsened by the next day. Significant swelling is present in her fingers, and she had pain in her knees, hips, and wrists. The joint pain is unusual for her and improved with 800 mg of ibuprofen  taken this morning. She denies any known contact exposure, no recent new OTC herbs or meds, no new RX meds. She has celiac disease, but her typical reaction to gluten involves gastrointestinal symptoms. She denies recent gluten exposure, new medications, or known allergens. She recalls a random fever last week without other symptoms and no recent illness in her household. No recent tick bites, throat tightening, or shortness of breath. She suspects a possible contact allergen from her dog, who was recently groomed and sleeps in her bed, but notes the absence of itching.  Assessment & Plan Unexplained Rash and Joint Swelling - Acute non-itchy rash resolved; persistent mild joint swelling in hands, and joint pain in wrists, knees, and hips. Differential includes allergic reaction, post-viral rash, or systemic causes. Celiac disease unlikely related due to lack of gluten exposure and typically has gastrointestinal symptoms. - Administer 60 mg DepoMedrol injection to reduce  inflammation, suppress immune system and prevent recurrence. - Continue Zyrtec  and over-the-counter PPI through the weekend. - Consider further testing and possible allergist referral if symptoms recur.     Subjective:    Outpatient Medications Prior to Visit  Medication Sig Dispense Refill   cetirizine  (ZYRTEC ) 10 MG tablet Take 1 tablet (10 mg total) by mouth daily. 90 tablet 3   COMIRNATY SUSP injection Inject 0.3 mLs into the muscle once.     fluticasone  (FLONASE ) 50 MCG/ACT nasal spray Place 2 sprays into both nostrils daily. 16 g 2   Multiple Vitamin (MULTIVITAMIN) tablet Take 1 tablet by mouth daily.     ondansetron  (ZOFRAN ) 4 MG tablet Take 2 tablets (8 mg total) by mouth 2 (two) times daily. (Patient not taking: Reported on 04/30/2023) 30 tablet 0   tiZANidine  (ZANAFLEX ) 2 MG tablet Take 1 tablet (2 mg total) by mouth every 6 (six) - 8 (eight) hours as needed.  Not to exceed 3 doses in 24 hours. (Patient not taking: Reported on 04/30/2023) 45 tablet 0   No facility-administered medications prior to visit.   Past Medical History:  Diagnosis Date   Allergy 1991   Basal cell carcinoma of nasolabial crease 02/24/2012   Celiac disease    Hyperlipidemia    DIET CONTROLLED   PONV (postoperative nausea and vomiting)    SVD (spontaneous vaginal delivery)    X 2   Uterine prolapse 08/23/2009   Past Surgical History:  Procedure Laterality Date   BILATERAL SALPINGECTOMY Bilateral 01/24/2014  Procedure: BILATERAL SALPINGECTOMY;  Surgeon: Ana Balling, MD;  Location: [facility];  Service: Gynecology;  Laterality: Bilateral;   CYSTOCELE REPAIR N/A 01/24/2014   Procedure: ANTERIOR REPAIR (CYSTOCELE);  Surgeon: Ana Balling, MD;  Location: [facility];  Service: Gynecology;  Laterality: N/A;   NECK SURGERY  12/2021   two-level total disc replacement C4-5 C5-6   SKIN CANCER EXCISION     NOSE X 3   TONSILLECTOMY AND ADENOIDECTOMY  1995   TOTAL VAGINAL HYSTERECTOMY  01/27/2014   *still has ovaries    TUBAL LIGATION     VAGINAL HYSTERECTOMY N/A 01/24/2014   Procedure: HYSTERECTOMY VAGINAL;  Surgeon: Ana Balling, MD;  Location: [facility];  Service: Gynecology;  Laterality: N/A;   WISDOM TOOTH EXTRACTION     No Known Allergies    Objective:    Physical Exam Vitals and nursing note reviewed.  Constitutional:      Appearance: Normal appearance.   Cardiovascular:     Rate and Rhythm: Normal rate and regular rhythm.  Pulmonary:     Effort: Pulmonary effort is normal.     Breath sounds: Normal breath sounds.   Musculoskeletal:        General: Normal range of motion.     Right hand: Swelling (mild) present.     Left hand: Swelling (mild) present.   Skin:    General: Skin is warm and dry.     Findings: No bruising, erythema, lesion or rash.   Neurological:     Mental Status: She is alert.   Psychiatric:        Mood and Affect: Mood normal.        Behavior: Behavior normal.    BP 95/60   Pulse 72   Temp 98.1 F (36.7 C)   Ht 5' 2 (1.575 m)   Wt 127 lb (57.6 kg)   LMP 03/08/2013   SpO2 99%   BMI 23.23 kg/m  Wt Readings from Last 3 Encounters:  08/06/23 127 lb (57.6 kg)  04/30/23 124 lb (56.2 kg)  03/20/22 125 lb 9.6 oz (57 kg)      Versa Gore, NP

## 2023-11-22 ENCOUNTER — Ambulatory Visit (INDEPENDENT_AMBULATORY_CARE_PROVIDER_SITE_OTHER): Admitting: Dermatology

## 2023-11-22 ENCOUNTER — Other Ambulatory Visit (HOSPITAL_COMMUNITY): Payer: Self-pay

## 2023-11-22 ENCOUNTER — Encounter: Payer: Self-pay | Admitting: Dermatology

## 2023-11-22 DIAGNOSIS — L814 Other melanin hyperpigmentation: Secondary | ICD-10-CM | POA: Diagnosis not present

## 2023-11-22 DIAGNOSIS — L570 Actinic keratosis: Secondary | ICD-10-CM | POA: Diagnosis not present

## 2023-11-22 DIAGNOSIS — L821 Other seborrheic keratosis: Secondary | ICD-10-CM | POA: Diagnosis not present

## 2023-11-22 DIAGNOSIS — Z1283 Encounter for screening for malignant neoplasm of skin: Secondary | ICD-10-CM

## 2023-11-22 DIAGNOSIS — L988 Other specified disorders of the skin and subcutaneous tissue: Secondary | ICD-10-CM | POA: Diagnosis not present

## 2023-11-22 DIAGNOSIS — L578 Other skin changes due to chronic exposure to nonionizing radiation: Secondary | ICD-10-CM | POA: Diagnosis not present

## 2023-11-22 DIAGNOSIS — D229 Melanocytic nevi, unspecified: Secondary | ICD-10-CM

## 2023-11-22 DIAGNOSIS — D1801 Hemangioma of skin and subcutaneous tissue: Secondary | ICD-10-CM | POA: Diagnosis not present

## 2023-11-22 DIAGNOSIS — Z85828 Personal history of other malignant neoplasm of skin: Secondary | ICD-10-CM | POA: Diagnosis not present

## 2023-11-22 DIAGNOSIS — W908XXA Exposure to other nonionizing radiation, initial encounter: Secondary | ICD-10-CM

## 2023-11-22 MED ORDER — TRETINOIN 0.05 % EX CREA
TOPICAL_CREAM | Freq: Every day | CUTANEOUS | 3 refills | Status: AC
  Filled 2023-11-22: qty 45, 30d supply, fill #0

## 2023-11-22 MED ORDER — FLUOROURACIL 5 % EX CREA
TOPICAL_CREAM | Freq: Two times a day (BID) | CUTANEOUS | 3 refills | Status: AC
  Filled 2023-11-22: qty 40, 14d supply, fill #0

## 2023-11-22 NOTE — Patient Instructions (Signed)

## 2023-11-22 NOTE — Progress Notes (Unsigned)
 New Patient Visit   Subjective  [name] is a 43 y.o. female who presents for the following: Skin Cancer Screening and Full Body Skin Exam  The patient presents for Total-Body Skin Exam (TBSE) for skin cancer screening and mole check. The patient has spots, moles and lesions to be evaluated, some may be new or changing.  Extensive history of BCCs; used to spend a lot of time at the pool in her childhood  The following portions of the chart were reviewed this encounter and updated as appropriate: medications, allergies, medical history  Review of Systems:  No other skin or systemic complaints except as noted in HPI or Assessment and Plan.  Objective  Well appearing patient in no apparent distress; mood and affect are within normal limits.  A full examination was performed including scalp, head, eyes, ears, nose, lips, neck, chest, axillae, abdomen, back, buttocks, bilateral upper extremities, bilateral lower extremities, hands, feet, fingers, toes, fingernails, and toenails. All findings within normal limits unless otherwise noted below.   Relevant physical exam findings are noted in the Assessment and Plan.    Assessment & Plan   SKIN CANCER SCREENING PERFORMED TODAY.  ACTINIC DAMAGE - Chronic condition, secondary to cumulative UV/sun exposure - diffuse scaly erythematous macules with underlying dyspigmentation - Recommend daily broad spectrum sunscreen SPF 30+ to sun-exposed areas, reapply every 2 hours as needed.  - Staying in the shade or wearing long sleeves, sun glasses (UVA+UVB protection) and wide brim hats (4-inch brim around the entire circumference of the hat) are also recommended for sun protection.  - Call for new or changing lesions.  LENTIGINES, SEBORRHEIC KERATOSES, HEMANGIOMAS - Benign normal skin lesions - Benign-appearing - Call for any changes  MELANOCYTIC NEVI - Tan-brown and/or pink-flesh-colored symmetric macules and papules - Benign appearing on  exam today - Observation - Call clinic for new or changing moles - Recommend daily use of broad spectrum spf 30+ sunscreen to sun-exposed areas.   HISTORY OF BASAL CELL CARCINOMA OF THE SKIN - No evidence of recurrence today - Recommend regular full body skin exams - Recommend daily broad spectrum sunscreen SPF 30+ to sun-exposed areas, reapply every 2 hours as needed.  - Call if any new or changing lesions are noted between office visits   ACTINIC KERATOSIS Exam: Erythematous thin papules/macules with gritty scale at the face  Actinic keratoses are precancerous spots that appear secondary to cumulative UV radiation exposure/sun exposure over time. They are chronic with expected duration over 1 year. A portion of actinic keratoses will progress to squamous cell carcinoma of the skin. It is not possible to reliably predict which spots will progress to skin cancer and so treatment is recommended to prevent development of skin cancer.  Recommend daily broad spectrum sunscreen SPF 30+ to sun-exposed areas, reapply every 2 hours as needed.  Recommend staying in the shade or wearing long sleeves, sun glasses (UVA+UVB protection) and wide brim hats (4-inch brim around the entire circumference of the hat). Call for new or changing lesions.  Treatment Plan: Start 5-fluorouracil cream twice a day for 14 days to affected areas including face.  Reviewed course of treatment and expected reaction.  Patient advised to expect inflammation and crusting and advised that erosions are possible.  Patient advised to be diligent with sun protection during and after treatment. Handout with details of how to apply medication and what to expect provided. Counseled to keep medication out of reach of children and pets.  Reviewed course of  treatment and expected reaction.  Patient advised to expect inflammation and crusting and advised that erosions are possible.  Patient advised to be diligent with sun protection during  and after treatment. Handout with details of how to apply medication and what to expect provided. Counseled to keep medication out of reach of children and pets.  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 Current symptoms and skin changes indicate that treatment goals have not yet been met. The patient was counseled on the importance of consistent sun protection, including daily use of broad-spectrum sunscreen (SPF 30 or higher), wearing protective clothing, and avoiding excessive sun exposure to prevent further damage. The use of tretinoin was discussed as a key treatment for improving skin texture, fine lines, and hyperpigmentation, with instructions on proper application, potential irritation, and the importance of long-term adherence for optimal results. The patient was encouraged to continue treatment and follow up for further evaluation and possible treatment adjustments. - tretinoin (RETIN-A) 0.05 % cream; Apply topically at bedtime.  Dispense: 45 g; Refill: 3  AK (ACTINIC KERATOSIS) Head - Anterior (Face) Related Medications fluorouracil (EFUDEX) 5 % cream Apply topically to face 2 (two) times daily for 14 days  Return in about 6 months (around 05/21/2024) for TBSC.  I, Berwyn Lesches, Surg Tech III, am acting as scribe for RUFUS CHRISTELLA HOLY, MD.   Documentation: I have reviewed the above documentation for accuracy and completeness, and I agree with the above.  RUFUS CHRISTELLA HOLY, MD

## 2023-11-23 ENCOUNTER — Ambulatory Visit: Admitting: Dermatology

## 2023-11-29 ENCOUNTER — Other Ambulatory Visit (HOSPITAL_COMMUNITY): Payer: Self-pay

## 2023-12-30 DIAGNOSIS — H52223 Regular astigmatism, bilateral: Secondary | ICD-10-CM | POA: Diagnosis not present

## 2024-03-07 ENCOUNTER — Encounter: Payer: Self-pay | Admitting: General Surgery

## 2024-03-07 ENCOUNTER — Other Ambulatory Visit: Payer: Self-pay | Admitting: General Surgery

## 2024-03-07 DIAGNOSIS — D1723 Benign lipomatous neoplasm of skin and subcutaneous tissue of right leg: Secondary | ICD-10-CM

## 2024-03-10 ENCOUNTER — Ambulatory Visit
Admission: RE | Admit: 2024-03-10 | Discharge: 2024-03-10 | Disposition: A | Discharge status: Home / Self Care | Source: Ambulatory Visit | Attending: General Surgery | Admitting: General Surgery

## 2024-03-10 DIAGNOSIS — D1723 Benign lipomatous neoplasm of skin and subcutaneous tissue of right leg: Secondary | ICD-10-CM

## 2024-03-14 ENCOUNTER — Ambulatory Visit: Payer: Self-pay | Admitting: General Surgery

## 2024-05-03 ENCOUNTER — Encounter: Admitting: Physician Assistant

## 2024-05-22 ENCOUNTER — Ambulatory Visit: Admitting: Dermatology
